# Patient Record
Sex: Male | Born: 1935 | Race: White | Hispanic: No | Marital: Married | Smoking: Former smoker
Health system: Southern US, Community
[De-identification: ages and names within clinical notes are randomized; demographics above are authoritative.]

## PROBLEM LIST (undated history)

## (undated) DIAGNOSIS — M1990 Unspecified osteoarthritis, unspecified site: Secondary | ICD-10-CM

## (undated) DIAGNOSIS — L719 Rosacea, unspecified: Secondary | ICD-10-CM

## (undated) DIAGNOSIS — I10 Essential (primary) hypertension: Secondary | ICD-10-CM

## (undated) DIAGNOSIS — M75120 Complete rotator cuff tear or rupture of unspecified shoulder, not specified as traumatic: Secondary | ICD-10-CM

## (undated) DIAGNOSIS — D126 Benign neoplasm of colon, unspecified: Secondary | ICD-10-CM

## (undated) DIAGNOSIS — K645 Perianal venous thrombosis: Secondary | ICD-10-CM

## (undated) HISTORY — DX: Benign neoplasm of colon, unspecified: D12.6

## (undated) HISTORY — PX: GLAUCOMA SURGERY: SHX656

## (undated) HISTORY — DX: Unspecified osteoarthritis, unspecified site: M19.90

## (undated) HISTORY — DX: Perianal venous thrombosis: K64.5

## (undated) HISTORY — DX: Essential (primary) hypertension: I10

## (undated) HISTORY — DX: Rosacea, unspecified: L71.9

## (undated) HISTORY — PX: JOINT REPLACEMENT: SHX530

## (undated) HISTORY — DX: Complete rotator cuff tear or rupture of unspecified shoulder, not specified as traumatic: M75.120

---

## 2003-06-24 ENCOUNTER — Encounter
Admission: RE | Admit: 2003-06-24 | Discharge: 2003-09-11 | Payer: Self-pay | Admitting: Physical Medicine and Rehabilitation

## 2005-02-11 ENCOUNTER — Ambulatory Visit: Payer: Self-pay | Admitting: Internal Medicine

## 2005-03-04 ENCOUNTER — Ambulatory Visit: Payer: Self-pay | Admitting: Internal Medicine

## 2006-02-14 ENCOUNTER — Ambulatory Visit: Payer: Self-pay | Admitting: Internal Medicine

## 2006-03-28 ENCOUNTER — Ambulatory Visit: Payer: Self-pay | Admitting: Internal Medicine

## 2006-05-10 ENCOUNTER — Ambulatory Visit: Payer: Self-pay | Admitting: Internal Medicine

## 2006-07-27 ENCOUNTER — Ambulatory Visit: Payer: Self-pay | Admitting: Internal Medicine

## 2006-12-08 ENCOUNTER — Ambulatory Visit: Payer: Self-pay | Admitting: Internal Medicine

## 2007-02-15 ENCOUNTER — Ambulatory Visit: Payer: Self-pay | Admitting: Internal Medicine

## 2007-02-15 LAB — CONVERTED CEMR LAB
ALT: 27 units/L (ref 0–40)
AST: 29 units/L (ref 0–37)
Albumin: 4.2 g/dL (ref 3.5–5.2)
Alkaline Phosphatase: 87 units/L (ref 39–117)
BUN: 7 mg/dL (ref 6–23)
Basophils Absolute: 0 10*3/uL (ref 0.0–0.1)
Basophils Relative: 0.1 % (ref 0.0–1.0)
Bilirubin, Direct: 0.1 mg/dL (ref 0.0–0.3)
CO2: 32 meq/L (ref 19–32)
Calcium: 9.4 mg/dL (ref 8.4–10.5)
Chloride: 107 meq/L (ref 96–112)
Cholesterol: 216 mg/dL (ref 0–200)
Creatinine, Ser: 1 mg/dL (ref 0.4–1.5)
Eosinophils Absolute: 0.3 10*3/uL (ref 0.0–0.6)
Eosinophils Relative: 4 % (ref 0.0–5.0)
GFR calc Af Amer: 95 mL/min
GFR calc non Af Amer: 79 mL/min
Glucose, Bld: 93 mg/dL (ref 70–99)
HCT: 42.2 % (ref 39.0–52.0)
HDL: 50.8 mg/dL (ref >39.0)
Hemoglobin: 14.5 g/dL (ref 13.0–17.0)
Lymphocytes Relative: 34 % (ref 12.0–46.0)
MCHC: 34.3 g/dL (ref 30.0–36.0)
MCV: 88.1 fL (ref 78.0–100.0)
Monocytes Absolute: 0.6 10*3/uL (ref 0.2–0.7)
Monocytes Relative: 8.9 % (ref 3.0–11.0)
Neutro Abs: 3.5 10*3/uL (ref 1.4–7.7)
Neutrophils Relative %: 53 % (ref 43.0–77.0)
PSA: 3.33 ng/mL (ref 0.10–4.00)
Platelets: 209 10*3/uL (ref 150–400)
Potassium: 4.4 meq/L (ref 3.5–5.1)
RBC: 4.79 M/uL (ref 4.22–5.81)
RDW: 12.8 % (ref 11.5–14.6)
Sodium: 146 meq/L — ABNORMAL HIGH (ref 135–145)
TSH: 2.94 microintl units/mL (ref 0.35–5.50)
Total Bilirubin: 1.3 mg/dL — ABNORMAL HIGH (ref 0.3–1.2)
Total CHOL/HDL Ratio: 4.3
Total Protein: 7.6 g/dL (ref 6.0–8.3)
Triglycerides: 79 mg/dL (ref 0–149)
VLDL: 16 mg/dL (ref 0–40)
WBC: 6.7 10*3/uL (ref 4.5–10.5)

## 2007-05-10 ENCOUNTER — Ambulatory Visit: Payer: Self-pay | Admitting: Internal Medicine

## 2007-07-17 DIAGNOSIS — M1990 Unspecified osteoarthritis, unspecified site: Secondary | ICD-10-CM | POA: Insufficient documentation

## 2007-07-17 DIAGNOSIS — I10 Essential (primary) hypertension: Secondary | ICD-10-CM | POA: Insufficient documentation

## 2007-08-17 ENCOUNTER — Ambulatory Visit: Payer: Self-pay | Admitting: Internal Medicine

## 2007-12-11 ENCOUNTER — Ambulatory Visit: Payer: Self-pay | Admitting: Internal Medicine

## 2007-12-11 DIAGNOSIS — F528 Other sexual dysfunction not due to a substance or known physiological condition: Secondary | ICD-10-CM

## 2008-01-29 ENCOUNTER — Telehealth (INDEPENDENT_AMBULATORY_CARE_PROVIDER_SITE_OTHER): Payer: Self-pay | Admitting: *Deleted

## 2008-01-29 DIAGNOSIS — M7500 Adhesive capsulitis of unspecified shoulder: Secondary | ICD-10-CM

## 2008-02-05 ENCOUNTER — Encounter: Admission: RE | Admit: 2008-02-05 | Discharge: 2008-02-05 | Payer: Self-pay | Admitting: Internal Medicine

## 2008-02-06 DIAGNOSIS — M75120 Complete rotator cuff tear or rupture of unspecified shoulder, not specified as traumatic: Secondary | ICD-10-CM | POA: Insufficient documentation

## 2008-02-06 HISTORY — DX: Complete rotator cuff tear or rupture of unspecified shoulder, not specified as traumatic: M75.120

## 2008-02-13 ENCOUNTER — Telehealth: Payer: Self-pay | Admitting: Internal Medicine

## 2008-02-20 ENCOUNTER — Encounter: Payer: Self-pay | Admitting: Internal Medicine

## 2008-02-23 ENCOUNTER — Ambulatory Visit: Payer: Self-pay | Admitting: Internal Medicine

## 2008-02-23 LAB — CONVERTED CEMR LAB
AST: 24 units/L (ref 0–37)
Albumin: 3.9 g/dL (ref 3.5–5.2)
Alkaline Phosphatase: 79 units/L (ref 39–117)
BUN: 7 mg/dL (ref 6–23)
Basophils Absolute: 0 10*3/uL (ref 0.0–0.1)
Basophils Relative: 0.6 % (ref 0.0–1.0)
Bilirubin Urine: NEGATIVE
Blood in Urine, dipstick: NEGATIVE
CO2: 29 meq/L (ref 19–32)
Chloride: 107 meq/L (ref 96–112)
Cholesterol: 194 mg/dL (ref 0–200)
Creatinine, Ser: 0.9 mg/dL (ref 0.4–1.5)
Glucose, Urine, Semiquant: NEGATIVE
HCT: 42.2 % (ref 39.0–52.0)
Ketones, urine, test strip: NEGATIVE
LDL Cholesterol: 124 mg/dL — ABNORMAL HIGH (ref 0–99)
MCHC: 33.3 g/dL (ref 30.0–36.0)
Neutrophils Relative %: 46.8 % (ref 43.0–77.0)
Nitrite: NEGATIVE
Protein, U semiquant: NEGATIVE
RBC: 4.66 M/uL (ref 4.22–5.81)
RDW: 12.5 % (ref 11.5–14.6)
Sodium: 142 meq/L (ref 135–145)
Specific Gravity, Urine: 1.015
Total Bilirubin: 1 mg/dL (ref 0.3–1.2)
Total CHOL/HDL Ratio: 5.1
Urobilinogen, UA: 0.2
VLDL: 33 mg/dL (ref 0–40)
WBC Urine, dipstick: NEGATIVE
pH: 5.5

## 2008-02-29 ENCOUNTER — Ambulatory Visit: Payer: Self-pay | Admitting: Internal Medicine

## 2008-02-29 DIAGNOSIS — C4491 Basal cell carcinoma of skin, unspecified: Secondary | ICD-10-CM

## 2008-07-31 ENCOUNTER — Ambulatory Visit: Payer: Self-pay | Admitting: Internal Medicine

## 2009-01-27 ENCOUNTER — Ambulatory Visit: Payer: Self-pay | Admitting: Internal Medicine

## 2009-01-27 LAB — CONVERTED CEMR LAB
ALT: 26 units/L (ref 0–53)
AST: 27 units/L (ref 0–37)
BUN: 9 mg/dL (ref 6–23)
Basophils Relative: 0.4 % (ref 0.0–3.0)
CO2: 29 meq/L (ref 19–32)
Chloride: 104 meq/L (ref 96–112)
Creatinine, Ser: 0.9 mg/dL (ref 0.4–1.5)
Direct LDL: 138.7 mg/dL
Eosinophils Relative: 6.3 % — ABNORMAL HIGH (ref 0.0–5.0)
HDL: 39.9 mg/dL (ref >39.0)
Lymphocytes Relative: 36.9 % (ref 12.0–46.0)
Monocytes Relative: 9.7 % (ref 3.0–12.0)
Neutrophils Relative %: 46.7 % (ref 43.0–77.0)
RBC: 4.75 M/uL (ref 4.22–5.81)
Total Bilirubin: 1.1 mg/dL (ref 0.3–1.2)
VLDL: 28 mg/dL (ref 0–40)
WBC: 5.7 10*3/uL (ref 4.5–10.5)

## 2009-02-03 ENCOUNTER — Ambulatory Visit: Payer: Self-pay | Admitting: Internal Medicine

## 2009-02-03 DIAGNOSIS — L570 Actinic keratosis: Secondary | ICD-10-CM

## 2009-02-03 DIAGNOSIS — E785 Hyperlipidemia, unspecified: Secondary | ICD-10-CM

## 2009-02-03 LAB — CONVERTED CEMR LAB: LDL Goal: 100 mg/dL

## 2010-02-10 DIAGNOSIS — D126 Benign neoplasm of colon, unspecified: Secondary | ICD-10-CM

## 2010-02-10 HISTORY — DX: Benign neoplasm of colon, unspecified: D12.6

## 2010-02-11 ENCOUNTER — Ambulatory Visit: Payer: Self-pay | Admitting: Internal Medicine

## 2010-02-11 LAB — CONVERTED CEMR LAB
ALT: 29 units/L (ref 0–53)
AST: 29 units/L (ref 0–37)
Albumin: 4.3 g/dL (ref 3.5–5.2)
CO2: 30 meq/L (ref 19–32)
Calcium: 9.1 mg/dL (ref 8.4–10.5)
Cholesterol: 201 mg/dL — ABNORMAL HIGH (ref 0–200)
Direct LDL: 127.3 mg/dL
GFR calc non Af Amer: 87.78 mL/min (ref >60)
PSA: 4.27 ng/mL — ABNORMAL HIGH (ref 0.10–4.00)
Sodium: 141 meq/L (ref 135–145)
TSH: 2.67 microintl units/mL (ref 0.35–5.50)
Total CHOL/HDL Ratio: 4
Total Protein: 8 g/dL (ref 6.0–8.3)
Triglycerides: 96 mg/dL (ref 0.0–149.0)

## 2010-02-13 ENCOUNTER — Encounter (INDEPENDENT_AMBULATORY_CARE_PROVIDER_SITE_OTHER): Payer: Self-pay | Admitting: *Deleted

## 2010-02-17 ENCOUNTER — Ambulatory Visit: Payer: Self-pay | Admitting: Gastroenterology

## 2010-02-23 ENCOUNTER — Ambulatory Visit: Payer: Self-pay

## 2010-02-23 ENCOUNTER — Encounter: Payer: Self-pay | Admitting: Internal Medicine

## 2010-02-24 ENCOUNTER — Ambulatory Visit: Payer: Self-pay | Admitting: Gastroenterology

## 2010-02-26 ENCOUNTER — Encounter: Payer: Self-pay | Admitting: Gastroenterology

## 2010-03-04 ENCOUNTER — Ambulatory Visit: Payer: Self-pay | Admitting: Internal Medicine

## 2010-04-15 ENCOUNTER — Ambulatory Visit: Payer: Self-pay | Admitting: Internal Medicine

## 2010-04-15 DIAGNOSIS — L719 Rosacea, unspecified: Secondary | ICD-10-CM

## 2010-04-29 ENCOUNTER — Telehealth: Payer: Self-pay | Admitting: Internal Medicine

## 2010-04-29 ENCOUNTER — Ambulatory Visit: Payer: Self-pay | Admitting: Internal Medicine

## 2010-08-12 ENCOUNTER — Ambulatory Visit: Payer: Self-pay | Admitting: Internal Medicine

## 2010-08-12 DIAGNOSIS — K645 Perianal venous thrombosis: Secondary | ICD-10-CM

## 2010-08-12 HISTORY — DX: Perianal venous thrombosis: K64.5

## 2010-09-01 ENCOUNTER — Ambulatory Visit: Payer: Self-pay | Admitting: Internal Medicine

## 2010-10-19 ENCOUNTER — Ambulatory Visit: Payer: Self-pay | Admitting: Internal Medicine

## 2010-10-19 LAB — CONVERTED CEMR LAB
Calcium: 9.3 mg/dL (ref 8.4–10.5)
Creatinine, Ser: 1 mg/dL (ref 0.4–1.5)
GFR calc non Af Amer: 80.36 mL/min (ref >60)

## 2010-10-26 ENCOUNTER — Ambulatory Visit: Payer: Self-pay | Admitting: Internal Medicine

## 2010-11-02 ENCOUNTER — Telehealth: Payer: Self-pay | Admitting: Internal Medicine

## 2010-11-23 ENCOUNTER — Ambulatory Visit: Payer: Self-pay | Admitting: Internal Medicine

## 2010-11-23 DIAGNOSIS — R29818 Other symptoms and signs involving the nervous system: Secondary | ICD-10-CM | POA: Insufficient documentation

## 2010-11-23 DIAGNOSIS — J069 Acute upper respiratory infection, unspecified: Secondary | ICD-10-CM | POA: Insufficient documentation

## 2010-11-23 DIAGNOSIS — R21 Rash and other nonspecific skin eruption: Secondary | ICD-10-CM

## 2010-12-21 ENCOUNTER — Telehealth: Payer: Self-pay | Admitting: Internal Medicine

## 2010-12-30 ENCOUNTER — Ambulatory Visit
Admission: RE | Admit: 2010-12-30 | Discharge: 2010-12-30 | Payer: Self-pay | Source: Home / Self Care | Attending: Internal Medicine | Admitting: Internal Medicine

## 2010-12-30 DIAGNOSIS — J321 Chronic frontal sinusitis: Secondary | ICD-10-CM | POA: Insufficient documentation

## 2011-01-12 NOTE — Miscellaneous (Signed)
Summary: Orders Update  Clinical Lists Changes  Orders: Added new Test order of Abdominal Aorta Duplex (Abd Aorta Duplex) - Signed 

## 2011-01-12 NOTE — Miscellaneous (Signed)
Summary: Waiver of Liability for Zostavax  Waiver of Liability for Zostavax   Imported By: Maryln Gottron 03/06/2010 12:41:31  _____________________________________________________________________  External Attachment:    Type:   Image     Comment:   External Document

## 2011-01-12 NOTE — Assessment & Plan Note (Signed)
Summary: zostavax at 4pm  Nurse Visit   Allergies: 1)  ! Celebrex  Immunizations Administered:  Zostavax # 1:    Vaccine Type: Zostavax    Site: left deltoid    Mfr: Merck    Dose: 0.5 ml    Route: [place]    Given by: Willy Eddy, LPN    Exp. Date: 12/31/2010    Lot #: [id number]  Orders Added: 1)  Zoster (Shingles) Vaccine Live [90736] 2)  Admin 1st Vaccine [09811]

## 2011-01-12 NOTE — Letter (Signed)
Summary: Patient Notice- Polyp Results  [facility]  [address] [place], [place] [address]   Phone: [phone]  Fax: [phone]        February 26, 2010 MRN: [id number]    Mitchell Walker [address] [place], [place]  [address]    Dear Mitchell Walker,  I am pleased to inform you that the colon polyp(s) removed during your recent colonoscopy was (were) found to be benign (no cancer detected) upon pathologic examination.  I recommend you have a repeat colonoscopy examination in 5 years to look for recurrent polyps, as having colon polyps increases your risk for having recurrent polyps or even colon cancer in the future.  Should you develop new or worsening symptoms of abdominal pain, bowel habit changes or bleeding from the rectum or bowels, please schedule an evaluation with either your primary care physician or with me.  Continue treatment plan as outlined the day of your exam.  Please call us if you are having persistent problems or have questions about your condition that have not been fully answered at this time.  Sincerely,  Meryl Dare MD [facility]  This letter has been electronically signed by your physician.  Appended Document: Patient Notice- Polyp Results Letter mailed 3.18.11

## 2011-01-12 NOTE — Progress Notes (Signed)
Summary: requesting muscle relaxer  Phone Note Call from Patient Call back at Home Phone [phone]   Caller: Patient--triage vm Summary of Call: requesting a muscle relaxer for his back, that he hurt on Saturday. wants [facility] to return his call.       [facility]. Initial call taken by: Warnell Forester,  November 02, 2010 9:05 AM    New/Updated Medications: CYCLOBENZAPRINE HCL 10 MG TABS (CYCLOBENZAPRINE HCL) 1 three times a day as needed Prescriptions: CYCLOBENZAPRINE HCL 10 MG TABS (CYCLOBENZAPRINE HCL) 1 three times a day as needed  #30 x 0   Entered by:   Willy Eddy, LPN   Authorized by:   [name] MD   Signed by:   Willy Eddy, LPN on 28/41/3244   Method used:   Electronically to        [facility]  [phone]* (retail)       [address]       [place], [place]  [address]       Ph: [phone] or [phone]       Fax: [phone]   RxID:   [id number]

## 2011-01-12 NOTE — Assessment & Plan Note (Signed)
Summary: FLU-SHOT/RCD  Nurse Visit   Review of Systems       Flu Vaccine Consent Questions     Do you have a history of severe allergic reactions to this vaccine? no    Any prior history of allergic reactions to egg and/or gelatin? no    Do you have a sensitivity to the preservative Thimersol? no    Do you have a past history of Guillan-Barre Syndrome? no    Do you currently have an acute febrile illness? no    Have you ever had a severe reaction to latex? no    Vaccine information given and explained to patient? yes    Are you currently pregnant? no    Lot Number:[id number]   Exp Date:06/12/2011   Site Given  Left Deltoid IM    Allergies: 1)  ! Celebrex  Orders Added: 1)  Flu Vaccine 78yrs + MEDICARE PATIENTS [Q2039] 2)  Administration Flu vaccine - MCR [G0008]

## 2011-01-12 NOTE — Miscellaneous (Signed)
Summary: [facility] Previsit/prep  starClinical Lists Changes  Medications: Added new medication of MOVIPREP 100 GM  SOLR (PEG-KCL-NACL-NASULF-NA ASC-C) As per prep instructions. - Signed Rx of MOVIPREP 100 GM  SOLR (PEG-KCL-NACL-NASULF-NA ASC-C) As per prep instructions.;  #1 x 0;  Signed;  Entered by: Wyona Almas RN;  Authorized by: Meryl Dare MD [facility];  Method used: Electronically to [facility]  [phone]*, [address], [place], [place], [place]  [address], Ph: [phone] or [phone], Fax: [phone] Observations: Added new observation of ALLERGY REV: Done (02/17/2010 10:54)    Prescriptions: MOVIPREP 100 GM  SOLR (PEG-KCL-NACL-NASULF-NA ASC-C) As per prep instructions.  #1 x 0   Entered by:   Wyona Almas RN   Authorized by:   Meryl Dare MD [facility]   Signed by:   Wyona Almas RN on 02/17/2010   Method used:   Electronically to        [facility]  [phone]* (retail)       [address]       [place], [place]  [address]       Ph: [phone] or [phone]       Fax: [phone]   RxID:   [id number]

## 2011-01-12 NOTE — Letter (Signed)
Summary: [facility] Instructions  [facility]  [address] [place], [place] [address]   Phone: [phone]  Fax: [phone]       Mitchell Walker    05-Apr-1934    MRN: [id number]        Procedure Day Dorna Bloom:  Jake Shark  02/24/10     Arrival Time:  9:00AM     Procedure Time:  10:00AM     Location of Procedure:                    _ X_  [facility] (4th Floor)                        PREPARATION FOR COLONOSCOPY WITH MOVIPREP   Starting 5 days prior to your procedure 02/19/10 do not eat nuts, seeds, popcorn, corn, beans, peas,  salads, or any raw vegetables.  Do not take any fiber supplements (e.g. Metamucil, Citrucel, and Benefiber).  THE DAY BEFORE YOUR PROCEDURE         DATE: 02/23/10  DAY: MONDAY  1.  Drink clear liquids the entire day-NO SOLID FOOD  2.  Do not drink anything colored red or purple.  Avoid juices with pulp.  No orange juice.  3.  Drink at least 64 oz. (8 glasses) of fluid/clear liquids during the day to prevent dehydration and help the prep work efficiently.  CLEAR LIQUIDS INCLUDE: Water Jello Ice Popsicles Tea (sugar ok, no milk/cream) Powdered fruit flavored drinks Coffee (sugar ok, no milk/cream) Gatorade Juice: apple, white grape, white cranberry  Lemonade Clear bullion, consomm, broth Carbonated beverages (any kind) Strained chicken noodle soup Hard Candy                             4.  In the morning, mix first dose of MoviPrep solution:    Empty 1 Pouch A and 1 Pouch B into the disposable container    Add lukewarm drinking water to the top line of the container. Mix to dissolve    Refrigerate (mixed solution should be used within 24 hrs)  5.  Begin drinking the prep at 5:00 p.m. The MoviPrep container is divided by 4 marks.   Every 15 minutes drink the solution down to the next mark (approximately 8 oz) until the full liter is complete.   6.  Follow completed prep with 16 oz of clear liquid of your choice (Nothing  red or purple).  Continue to drink clear liquids until bedtime.  7.  Before going to bed, mix second dose of MoviPrep solution:    Empty 1 Pouch A and 1 Pouch B into the disposable container    Add lukewarm drinking water to the top line of the container. Mix to dissolve    Refrigerate  THE DAY OF YOUR PROCEDURE      DATE: 02/24/10 DAY: TUESDAY  Beginning at 5:00a.m. (5 hours before procedure):         1. Every 15 minutes, drink the solution down to the next mark (approx 8 oz) until the full liter is complete.  2. Follow completed prep with 16 oz. of clear liquid of your choice.    3. You may drink clear liquids until 8:00AM (2 HOURS BEFORE PROCEDURE).   MEDICATION INSTRUCTIONS  Unless otherwise instructed, you should take regular prescription medications with a small sip of water   as early as possible the morning of  your procedure.  Additional medication instructions:  Hold Benazipril/HCTZ the morning of procedure.         OTHER INSTRUCTIONS  You will need a responsible adult at least 75 years of age to accompany you and drive you home.   This person must remain in the waiting room during your procedure.  Wear loose fitting clothing that is easily removed.  Leave jewelry and other valuables at home.  However, you may wish to bring a book to read or  an iPod/MP3 player to listen to music as you wait for your procedure to start.  Remove all body piercing jewelry and leave at home.  Total time from sign-in until discharge is approximately 2-3 hours.  You should go home directly after your procedure and rest.  You can resume normal activities the  day after your procedure.  The day of your procedure you should not:   Drive   Make legal decisions   Operate machinery   Drink alcohol   Return to work  You will receive specific instructions about eating, activities and medications before you leave.    The above instructions have been reviewed and explained to  me by  Wyona Almas RN  February 17, 2010 11:12 AM     I fully understand and can verbalize these instructions _____________________________ Date _________

## 2011-01-12 NOTE — Assessment & Plan Note (Signed)
Summary: CPX, LABS / RS   Vital Signs:  Patient profile:   77 year old male Height:      70 inches Weight:      191 pounds BMI:     27.50 Temp:     98.2 degrees F oral Pulse rate:   60 / minute Resp:     14 per minute BP sitting:   170 / 100  (left arm)  Vitals Entered By: Willy Eddy, LPN (February 12, 4695 10:58 AM) CC: anuual visit for disease managmemment-fasting this am had flex about 6 years ago but no colonoscopy Is Patient Diabetic? No Pain Assessment Patient in pain? no      Nutritional Status BMI of 19 -24 = normal  Does patient need assistance? Functional Status Self care, Cook/clean, Shopping, Social activities Ambulation Normal Comments no depression noted   CC:  anuual visit for disease managmemment-fasting this am had flex about 6 years ago but no colonoscopy.  History of Present Illness: First medicare preventative visit   the pt was asked about functional status, depression, living will, all immunizations were reviewed prevention intervnetions were discussed and scheduled as appropriate amd referral and specialist care was reviewed which included yearly eye exam only  screening labs for cholesterol, PSA were ordered and discussion of AAA screening and referral for AA [place] dopler made  reveiwed medications fro generic substitutions   Preventive Screening-Counseling & Management  Alcohol-Tobacco     Smoking Status: quit  Problems Prior to Update: 1)  Actinic Keratosis  (ICD-702.0) 2)  Hyperlipidemia, Atherogenic  (ICD-272.4) 3)  Carcinoma, Basal Cell, Skin  (ICD-173.9) 4)  Rotator Cuff Tear  (ICD-727.61) 5)  Adhesive Capsulitis of Shoulder  (ICD-726.0) 6)  Erectile Dysfunction  (ICD-302.72) 7)  Unspec Disorders Bursae&tendons Shoulder Region  (ICD-726.10) 8)  Family History Diabetes 1st Degree Relative  (ICD-V18.0) 9)  Osteoarthritis  (ICD-715.90) 10)  Hypertension  (ICD-401.9)  Current Problems (verified): 1)  Actinic Keratosis   (ICD-702.0) 2)  Hyperlipidemia, Atherogenic  (ICD-272.4) 3)  Carcinoma, Basal Cell, Skin  (ICD-173.9) 4)  Rotator Cuff Tear  (ICD-727.61) 5)  Adhesive Capsulitis of Shoulder  (ICD-726.0) 6)  Erectile Dysfunction  (ICD-302.72) 7)  Unspec Disorders Bursae&tendons Shoulder Region  (ICD-726.10) 8)  Family History Diabetes 1st Degree Relative  (ICD-V18.0) 9)  Osteoarthritis  (ICD-715.90) 10)  Hypertension  (ICD-401.9)  Medications Prior to Update: 1)  Lotensin 40 Mg  Tabs (Benazepril Hcl) .... One Daily 2)  Norvasc 10 Mg  Tabs (Amlodipine Besylate) .... One Daily 3)  Amoxicillin 500 Mg Caps (Amoxicillin) .Marland Kitchen.. 1 Three Times A Day 4)  Pravastatin Sodium 40 Mg Tabs (Pravastatin Sodium) .... One By Mouth  Mondays and Friday Nights  Current Medications (verified): 1)  Lotensin 40 Mg  Tabs (Benazepril Hcl) .... One Daily 2)  Norvasc 10 Mg  Tabs (Amlodipine Besylate) .... One Daily 3)  Fish Oil 500 Mg Caps (Omega-3 Fatty Acids) .... 2 Once Daily  Allergies (verified): 1)  ! Celebrex  Past History:  Family History: Last updated: 07/17/2007 Family History of Arthritis Family History Diabetes 1st degree relative Family History Other cancer-Colon Fam hx DJD Fam hx CHF Fam hx CAD  Social History: Last updated: 07/17/2007 Former Smoker Alcohol use-yes Married  Risk Factors: Smoking Status: quit (02/11/2010)  Past medical, surgical, family and social histories (including risk factors) reviewed, and no changes noted (except as noted below).  Past Medical History: Reviewed history from 07/17/2007 and no changes required. Hypertension Osteoarthritis  Past Surgical  History: Reviewed history from 12/11/2007 and no changes required. Total knee replacement  bilateral glacoma surgery  Family History: Reviewed history from 07/17/2007 and no changes required. Family History of Arthritis Family History Diabetes 1st degree relative Family History Other cancer-Colon Fam hx DJD Fam hx  CHF Fam hx CAD  Social History: Reviewed history from 07/17/2007 and no changes required. Former Smoker Alcohol use-yes Married  Review of Systems  The patient denies anorexia, fever, weight loss, weight gain, vision loss, decreased hearing, hoarseness, chest pain, syncope, dyspnea on exertion, peripheral edema, prolonged cough, headaches, hemoptysis, abdominal pain, melena, hematochezia, severe indigestion/heartburn, hematuria, incontinence, genital sores, muscle weakness, suspicious skin lesions, transient blindness, difficulty walking, depression, unusual weight change, abnormal bleeding, enlarged lymph nodes, angioedema, breast masses, and testicular masses.    Physical Exam  General:  Well-developed,well-nourished,in no acute distress; alert,appropriate and cooperative throughout examination Head:  Normocephalic and atraumatic without obvious abnormalities. No apparent alopecia or balding. Ears:  R ear normal and L ear normal.   Mouth:  Oral mucosa and oropharynx without lesions or exudates.  Teeth in good repair. Neck:  No deformities, masses, or tenderness noted. Lungs:  normal respiratory effort and no dullness.   Heart:  normal rate and regular rhythm.   Abdomen:  Bowel sounds positive,abdomen soft and non-tender without masses, organomegaly or hernias noted.   Impression & Recommendations:  Problem # 1:  PREVENTIVE HEALTH CARE (ICD-V70.0)  completed medicare screening and orders Td Booster: Tdap (State) (12/14/2003)   Pneumovax: Historical (12/13/2004) Chol: 214 (01/27/2009)   HDL: 39.9 (01/27/2009)   LDL: DEL (01/27/2009)   TG: 140 (01/27/2009) TSH: 3.16 (01/27/2009)   PSA: 2.95 (01/27/2009)  Discussed using sunscreen, use of alcohol, drug use, self testicular exam, routine dental care, routine eye care, routine physical exam, seat belts, multiple vitamins, osteoporosis prevention, adequate calcium intake in diet, and recommendations for immunizations.  Discussed exercise  and checking cholesterol.  Discussed gun safety, safe sex, and contraception. Also recommend checking PSA.  Orders: First annual wellness visit with prevention plan  [phone]) Doppler Referral (Doppler)  Complete Medication List: 1)  Lotensin 40 Mg Tabs (Benazepril hcl) .... One daily 2)  Norvasc 10 Mg Tabs (Amlodipine besylate) .... One daily 3)  Fish Oil 500 Mg Caps (Omega-3 fatty acids) .... 2 once daily  Other Orders: EKG w/ Interpretation (93000) Gastroenterology Referral (GI) TLB-Lipid Panel (80061-LIPID) TLB-Hepatic/Liver Function Pnl (80076-HEPATIC) TLB-BMP (Basic Metabolic Panel-BMET) (80048-METABOL)  Prevention & Chronic Care Immunizations   Influenza vaccine: Not documented   Influenza vaccine deferral: Not available  (02/11/2010)   Influenza vaccine due: 08/13/2010    Tetanus booster: 12/14/2003: Tdap [facility])   Tetanus booster due: 12/13/2013    Pneumococcal vaccine: Historical  (12/13/2004)   Pneumococcal vaccine due: 12/13/2009    H. zoster vaccine: Not documented   H. zoster vaccine deferral: Not available  (02/11/2010)  Colorectal Screening   Hemoccult: Not documented    Colonoscopy: Not documented   Colonoscopy action/deferral: GI referral  (02/11/2010)  Other Screening   PSA: 2.95  (01/27/2009)   PSA action/deferral: Discussed-PSA requested  (02/11/2010)   Smoking status: quit  (02/11/2010)    Screening comments: SET UP aaa SCREEN  Lipids   Total Cholesterol: 214  (01/27/2009)   Lipid panel action/deferral: Lipid Panel ordered   LDL: DEL  (01/27/2009)   LDL Direct: 138.7  (01/27/2009)   HDL: 39.9  (01/27/2009)   Triglycerides: 140  (01/27/2009)    SGOT (AST): 27  (01/27/2009)   BMP action: Ordered  SGPT (ALT): 26  (01/27/2009)   Alkaline phosphatase: 73  (01/27/2009)   Total bilirubin: 1.1  (01/27/2009)    Lipid flowsheet reviewed?: Yes   Progress toward LDL goal: Unchanged  Hypertension   Last Blood Pressure: 170 / 100   (02/11/2010)   Serum creatinine: 0.9  (01/27/2009)   BMP action: Ordered   Serum potassium 4.5  (01/27/2009)    Stage of readiness to change (hypertension management): Action   Hypertension comments: CHANGE MEDICATIONS  Self-Management Support :    Patient will work on the following items until the next clinic visit to reach self-care goals:     Medications and monitoring: take my medicines every day  (02/11/2010)     Eating: eat more vegetables, use fresh or frozen vegetables, eat foods that are low in salt  (02/11/2010)     Activity: take a 30 minute walk every day  (02/11/2010)    Hypertension self-management support: BP self-monitoring log, Written self-care plan  (02/11/2010)   Hypertension self-care plan printed.    Hypertension self-management support not done because: Good outcomes  (02/11/2010)    Lipid self-management support: Lipid monitoring log, Written self-care plan  (02/11/2010)   Lipid self-care plan printed.   Nursing Instructions: Give Pneumovax today GI referral for screening colonoscopy (see order)    Appended Document: CPX, LABS / RS     Vital Signs:  Patient profile:   77 year old male Weight:      191 pounds BMI:     27.50 Pulse rate:   60 / minute Pulse rhythm:   regular BP sitting:   170 / 100  Nutrition Counseling: Patient's BMI is greater than 25 and therefore counseled on weight management options.  History of Present Illness:  Hyperlipidemia Follow-Up      This is a 77 year old man who presents for Hyperlipidemia follow-up.  The patient denies muscle aches, GI upset, abdominal pain, flushing, itching, constipation, diarrhea, and fatigue.  The patient denies the following symptoms: chest pain/pressure, exercise intolerance, dypsnea, palpitations, syncope, and pedal edema.  Compliance with medications (by patient report) has been near 100%.  Dietary compliance has been good.  The patient reports exercising 3-4X per week.  Adjunctive  measures currently used by the patient include weight reduction.    Hypertension Follow-Up      The patient also presents for Hypertension follow-up.  The patient denies lightheadedness, urinary frequency, headaches, edema, impotence, rash, and fatigue.  The patient denies the following associated symptoms: chest pain, chest pressure, exercise intolerance, dyspnea, palpitations, syncope, leg edema, and pedal edema.  Compliance with medications (by patient report) has been near 100%.  The patient reports that dietary compliance has been good.  The patient reports exercising occasionally.   weight increased med compliance/ adherance  Problems Prior to Update: 1)  Preventive Health Care  (ICD-V70.0) 2)  Special Screening For Malignant Neoplasms Colon  (ICD-V76.51) 3)  Actinic Keratosis  (ICD-702.0) 4)  Hyperlipidemia, Atherogenic  (ICD-272.4) 5)  Carcinoma, Basal Cell, Skin  (ICD-173.9) 6)  Rotator Cuff Tear  (ICD-727.61) 7)  Adhesive Capsulitis of Shoulder  (ICD-726.0) 8)  Erectile Dysfunction  (ICD-302.72) 9)  Unspec Disorders Bursae&tendons Shoulder Region  (ICD-726.10) 10)  Family History Diabetes 1st Degree Relative  (ICD-V18.0) 11)  Osteoarthritis  (ICD-715.90) 12)  Hypertension  (ICD-401.9)  Medications Prior to Update: 1)  Lotensin 40 Mg  Tabs (Benazepril Hcl) .... One Daily 2)  Norvasc 10 Mg  Tabs (Amlodipine Besylate) .... One Daily 3)  Fish  Oil 500 Mg Caps (Omega-3 Fatty Acids) .... 2 Once Daily  Current Medications (verified): 1)  Lotensin 40 Mg  Tabs (Benazepril Hcl) .... One Daily 2)  Norvasc 10 Mg  Tabs (Amlodipine Besylate) .... One Daily 3)  Fish Oil 500 Mg Caps (Omega-3 Fatty Acids) .... 2 Once Daily  Allergies (verified): 1)  ! Celebrex  Past History:  Family History: Last updated: 07/17/2007 Family History of Arthritis Family History Diabetes 1st degree relative Family History Other cancer-Colon Fam hx DJD Fam hx CHF Fam hx CAD  Social History: Last  updated: 07/17/2007 Former Smoker Alcohol use-yes Married  Risk Factors: Smoking Status: quit (02/11/2010)  Past medical, surgical, family and social histories (including risk factors) reviewed, and no changes noted (except as noted below).  Past Medical History: Reviewed history from 07/17/2007 and no changes required. Hypertension Osteoarthritis  Past Surgical History: Reviewed history from 12/11/2007 and no changes required. Total knee replacement  bilateral glacoma surgery  Family History: Reviewed history from 07/17/2007 and no changes required. Family History of Arthritis Family History Diabetes 1st degree relative Family History Other cancer-Colon Fam hx DJD Fam hx CHF Fam hx CAD  Social History: Reviewed history from 07/17/2007 and no changes required. Former Smoker Alcohol use-yes Married  Review of Systems  The patient denies anorexia, fever, weight loss, weight gain, vision loss, decreased hearing, hoarseness, chest pain, syncope, dyspnea on exertion, peripheral edema, prolonged cough, headaches, hemoptysis, abdominal pain, melena, hematochezia, severe indigestion/heartburn, hematuria, incontinence, genital sores, muscle weakness, suspicious skin lesions, transient blindness, difficulty walking, depression, unusual weight change, abnormal bleeding, enlarged lymph nodes, angioedema, breast masses, and testicular masses.    Physical Exam  General:  alert and well-hydrated.   Head:  normocephalic, atraumatic, and no abnormalities palpated.   Eyes:  pupils equal and pupils round.   Ears:  R ear normal and L ear normal.   Nose:  no external deformity and no nasal discharge.   Neck:  No deformities, masses, or tenderness noted. Lungs:  normal respiratory effort and no wheezes.   Heart:  normal rate and regular rhythm.   Abdomen:  soft, non-tender, and normal bowel sounds.   Genitalia:  circumcised and no urethral discharge.   Prostate:  boggy and 1+ enlarged.    Msk:  No deformity or scoliosis noted of thoracic or lumbar spine.  normal ROM.   Pulses:  R and L carotid,radial,femoral,dorsalis pedis and posterior tibial pulses are full and equal bilaterally Extremities:  No clubbing, cyanosis, edema, or deformity noted with normal full range of motion of all joints.   Neurologic:  alert & oriented X3 and gait normal.   Skin:  turgor normal and color normal.   Cervical Nodes:  No lymphadenopathy noted Axillary Nodes:  No palpable lymphadenopathy Psych:  Oriented X3.     Impression & Recommendations:  Problem # 1:  HYPERLIPIDEMIA, ATHEROGENIC (ICD-272.4)  Labs Reviewed: SGOT: 27 (01/27/2009)   SGPT: 26 (01/27/2009) increased the fish oil and urged diet and weight loss  Lipid Goals: Chol Goal: 200 (02/03/2009)   HDL Goal: 40 (02/03/2009)   LDL Goal: 100 (02/03/2009)   TG Goal: 150 (02/03/2009)  Prior 10 Yr Risk Heart Disease: 33 % (02/03/2009)   HDL:39.9 (01/27/2009), 37.7 (02/23/2008)  LDL:DEL (01/27/2009), 124 (02/23/2008)  Chol:214 (01/27/2009), 194 (02/23/2008)  Trig:140 (01/27/2009), 164 (02/23/2008)  Orders: TLB-TSH (Thyroid Stimulating Hormone) (84443-TSH)  Problem # 2:  ERECTILE DYSFUNCTION (ICD-302.72) refill the viagra  Problem # 3:  HYPERTENSION (ICD-401.9)  His updated medication list for  this problem includes:    Benazepril-hydrochlorothiazide 20-25 Mg Tabs (Benazepril-hydrochlorothiazide) ..... One by mouth daily    Amlodipine Besylate 10 Mg Tabs (Amlodipine besylate) ..... One by mouth daily  BP today: 170/100 Prior BP: 170/100 (02/11/2010)  Prior 10 Yr Risk Heart Disease: 33 % (02/03/2009)  Labs Reviewed: K+: 4.5 (01/27/2009) Creat: : 0.9 (01/27/2009)   Chol: 214 (01/27/2009)   HDL: 39.9 (01/27/2009)   LDL: DEL (01/27/2009)   TG: 140 (01/27/2009)  Complete Medication List: 1)  Benazepril-hydrochlorothiazide 20-25 Mg Tabs (Benazepril-hydrochlorothiazide) .... One by mouth daily 2)  Amlodipine Besylate 10 Mg Tabs  (Amlodipine besylate) .... One by mouth daily 3)  Fish Oil Concentrate 1000 Mg Caps (Omega-3 fatty acids) .... One two times a day  Other Orders: Venipuncture (04540) TLB-PSA (Prostate Specific Antigen) (84153-PSA)  Patient Instructions: 1)  Please schedule a follow-up appointment in 2 months. Prescriptions: BENAZEPRIL-HYDROCHLOROTHIAZIDE 20-25 MG TABS (BENAZEPRIL-HYDROCHLOROTHIAZIDE) one by mouth daily  #90 x 3   Entered and Authorized by:   [name] MD   Signed by:   [name] MD on 02/11/2010   Method used:   Electronically to        [facility]  [phone]* (retail)       [address]       [place], [place]  [address]       Ph: [phone] or [phone]       Fax: [phone]   RxID:   [id number] AMLODIPINE BESYLATE 10 MG TABS (AMLODIPINE BESYLATE) one by mouth daily  #90 x 3   Entered and Authorized by:   [name] MD   Signed by:   [name] MD on 02/11/2010   Method used:   Electronically to        [facility]* (mail-order)             ,          Ph: [phone]       Fax: [phone]   RxID:   [id number]   Appended Document: Orders Update    Clinical Lists Changes  Orders: Added new Service order of Pneumococcal Vaccine (57322) - Signed Added new Service order of Admin 1st Vaccine (02542) - Signed Observations: Added new observation of PNEUMOVAXLOT: 0956z (02/11/2010 12:22) Added new observation of PNEUMOVAXEXP: 11/29/2011 (02/11/2010 12:22) Added new observation of PNEUMOVAXBY: Willy Eddy, LPN (70/62/3762 12:22) Added new observation of PNEUMOVAXRTE: IM (02/11/2010 12:22) Added new observation of PNEUMOVAXDOS: 0.5 ml (02/11/2010 12:22) Added new observation of PNEUMOVAXMFR: Merck (02/11/2010 12:22) Added new observation of PNEUMOVAXSIT: left deltoid (02/11/2010 12:22) Added new observation of PNEUMOVAX: Pneumovax (Medicare) (02/11/2010 12:22)       Immunizations  Administered:  Pneumonia Vaccine:    Vaccine Type: Pneumovax (Medicare)    Site: left deltoid    Mfr: Merck    Dose: 0.5 ml    Route: IM    Given by: Willy Eddy, LPN    Exp. Date: 11/29/2011    Lot #: [id number]

## 2011-01-12 NOTE — Assessment & Plan Note (Signed)
Summary: 6 month rov/nrj   Vital Signs:  Patient profile:   77 year old male Weight:      187 pounds Temp:     98.6 degrees F oral Pulse rate:   76 / minute Pulse rhythm:   regular Resp:     16 per minute BP sitting:   144 / 82  Vitals Entered By: Lynann Beaver CMA AAMA (October 26, 2010 9:16 AM) CC: rov, Hypertension Management Is Patient Diabetic? No Pain Assessment Patient in pain? no        Primary Care Provider:  [name] MD  CC:  rov and Hypertension Management.  History of Present Illness: hemorrhiods have been quiet! blood pressure is well controlled with no side efects and potassium and renal functon stable his main complaint is the rosacia and the metrogel controls the face but the nose has become increasingly inflamed his blood pressure is well controlled and he tolerates the medicsations without reported side effects  Hypertension History:      He denies headache, chest pain, palpitations, dyspnea with exertion, orthopnea, PND, peripheral edema, visual symptoms, neurologic problems, syncope, and side effects from treatment.        Positive major cardiovascular risk factors include male age 55 years old or older, hyperlipidemia, and hypertension.  Negative major cardiovascular risk factors include non-tobacco-user status.        Further assessment for target organ damage reveals no history of ASHD, stroke/TIA, or peripheral vascular disease.     Preventive Screening-Counseling & Management  Alcohol-Tobacco     Smoking Status: quit     Tobacco Counseling: to remain off tobacco products  Problems Prior to Update: 1)  External Hemorrhoids, Thrombosed  (ICD-455.4) 2)  Acne Rosacea  (ICD-695.3) 3)  Preventive Health Care  (ICD-V70.0) 4)  Actinic Keratosis  (ICD-702.0) 5)  Hyperlipidemia, Atherogenic  (ICD-272.4) 6)  Carcinoma, Basal Cell, Skin  (ICD-173.9) 7)  Rotator Cuff Tear  (ICD-727.61) 8)  Adhesive Capsulitis of Shoulder  (ICD-726.0) 9)   Erectile Dysfunction  (ICD-302.72) 10)  Unspec Disorders Bursae&tendons Shoulder Region  (ICD-726.10) 11)  Family History Diabetes 1st Degree Relative  (ICD-V18.0) 12)  Osteoarthritis  (ICD-715.90) 13)  Hypertension  (ICD-401.9)  Medications Prior to Update: 1)  Benazepril-Hydrochlorothiazide 20-25 Mg Tabs (Benazepril-Hydrochlorothiazide) .... One By Mouth Daily 2)  Amlodipine Besylate 10 Mg Tabs (Amlodipine Besylate) .... One By Mouth Daily 3)  Fish Oil Concentrate 1000 Mg Caps (Omega-3 Fatty Acids) .... One Two Times A Day 4)  Metrogel 1 % Gel (Metronidazole) .... Apply To Face Two Times A Day As  Directed 5)  Vimovo 500-20 Mg Tbec (Naproxen-Esomeprazole) .... One By Mouth Two Times A Day 6)  Percocet 5-325 Mg Tabs (Oxycodone-Acetaminophen) .... One By Mouth Every 6 Hours  Current Medications (verified): 1)  Benazepril-Hydrochlorothiazide 20-25 Mg Tabs (Benazepril-Hydrochlorothiazide) .... One By Mouth Daily 2)  Amlodipine Besylate 10 Mg Tabs (Amlodipine Besylate) .... One By Mouth Daily 3)  Fish Oil Concentrate 1000 Mg Caps (Omega-3 Fatty Acids) .... One Two Times A Day 4)  Metrogel 1 % Gel (Metronidazole) .... Apply To Face Two Times A Day As  Directed 5)  Sulfamethoxazole-Tmp Ds 800-160 Mg Tabs (Sulfamethoxazole-Trimethoprim) .... One By Mouth Two Times A Day  Allergies (verified): 1)  ! Celebrex  Past History:  Family History: Last updated: 07/17/2007 Family History of Arthritis Family History Diabetes 1st degree relative Family History Other cancer-Colon Fam hx DJD Fam hx CHF Fam hx CAD  Social History: Last updated: 07/17/2007 Former  Smoker Alcohol use-yes Married  Risk Factors: Smoking Status: quit (10/26/2010)  Past medical, surgical, family and social histories (including risk factors) reviewed, and no changes noted (except as noted below).  Past Medical History: Reviewed history from 04/29/2010 and no changes  required. Hypertension Osteoarthritis rosasea  Past Surgical History: Reviewed history from 12/11/2007 and no changes required. Total knee replacement  bilateral glacoma surgery  Family History: Reviewed history from 07/17/2007 and no changes required. Family History of Arthritis Family History Diabetes 1st degree relative Family History Other cancer-Colon Fam hx DJD Fam hx CHF Fam hx CAD  Social History: Reviewed history from 07/17/2007 and no changes required. Former Smoker Alcohol use-yes Married  Review of Systems  The patient denies anorexia, fever, weight loss, weight gain, vision loss, decreased hearing, hoarseness, chest pain, syncope, dyspnea on exertion, peripheral edema, prolonged cough, headaches, hemoptysis, abdominal pain, melena, hematochezia, severe indigestion/heartburn, hematuria, incontinence, genital sores, muscle weakness, suspicious skin lesions, transient blindness, difficulty walking, depression, unusual weight change, abnormal bleeding, enlarged lymph nodes, angioedema, breast masses, and testicular masses.    Physical Exam  General:  alert and well-hydrated.   Head:  normocephalic, atraumatic, and no abnormalities palpated.   Eyes:  pupils equal and pupils round.   Ears:  R ear normal and L ear normal.   Nose:  external deformity and external erythema.   Mouth:  Oral mucosa and oropharynx without lesions or exudates.  Teeth in good repair. Neck:  No deformities, masses, or tenderness noted. Lungs:  normal respiratory effort and no wheezes.   Heart:  normal rate and regular rhythm.   Abdomen:  soft, non-tender, and normal bowel sounds.   Msk:  No deformity or scoliosis noted of thoracic or lumbar spine.  normal ROM.   Pulses:  R and L carotid,radial,femoral,dorsalis pedis and posterior tibial pulses are full and equal bilaterally Extremities:  No clubbing, cyanosis, edema, or deformity noted with normal full range of motion of all joints.    Neurologic:  alert & oriented X3 and gait normal.     Impression & Recommendations:  Problem # 1:  ACNE ROSACEA (ICD-695.3) with deforming rash on nose add oral sulfa ( he is not on celebrex) moniteri in two months  Problem # 2:  ERECTILE DYSFUNCTION (ICD-302.72) viagra smaples Discussed proper use of medications, as well as side effects.   Problem # 3:  EXTERNAL HEMORRHOIDS, THROMBOSED (ICD-455.4) resolved post treatment  Problem # 4:  HYPERTENSION (ICD-401.9) Assessment: Improved  His updated medication list for this problem includes:    Benazepril-hydrochlorothiazide 20-25 Mg Tabs (Benazepril-hydrochlorothiazide) ..... One by mouth daily    Amlodipine Besylate 10 Mg Tabs (Amlodipine besylate) ..... One by mouth daily  BP today: 160/82 Prior BP: 136/80 (08/12/2010)  Prior 10 Yr Risk Heart Disease: Not enough information (04/15/2010)  Labs Reviewed: K+: 4.8 (10/19/2010) Creat: : 1.0 (10/19/2010)   Chol: 201 (02/11/2010)   HDL: 57.10 (02/11/2010)   LDL: DEL (01/27/2009)   TG: 96.0 (02/11/2010)  Complete Medication List: 1)  Benazepril-hydrochlorothiazide 20-25 Mg Tabs (Benazepril-hydrochlorothiazide) .... One by mouth daily 2)  Amlodipine Besylate 10 Mg Tabs (Amlodipine besylate) .... One by mouth daily 3)  Fish Oil Concentrate 1000 Mg Caps (Omega-3 fatty acids) .... One two times a day 4)  Metrogel 1 % Gel (Metronidazole) .... Apply to face two times a day as  directed 5)  Sulfamethoxazole-tmp Ds 800-160 Mg Tabs (Sulfamethoxazole-trimethoprim) .... One by mouth two times a day  Hypertension Assessment/Plan:      The patient's  hypertensive risk group is category B: At least one risk factor (excluding diabetes) with no target organ damage.  Today's blood pressure is 144/82.  His blood pressure goal is < 140/90.  Patient Instructions: 1)  Please schedule a follow-up appointment in 2 months. Prescriptions: SULFAMETHOXAZOLE-TMP DS 800-160 MG TABS  (SULFAMETHOXAZOLE-TRIMETHOPRIM) one by mouth two times a day  #60 x 1   Entered and Authorized by:   [name] MD   Signed by:   [name] MD on 10/26/2010   Method used:   Electronically to        [facility]  [phone]* (retail)       [address]       [place], [place]  [address]       Ph: [phone] or [phone]       Fax: [phone]   RxID:   [id number]    Orders Added: 1)  Est. Patient Level IV [59563]

## 2011-01-12 NOTE — Assessment & Plan Note (Signed)
Summary: 2 month fup//ccm   Vital Signs:  Patient profile:   77 year old male Height:      70 inches Weight:      188 pounds BMI:     27.07 Temp:     98.2 degrees F oral Pulse rate:   68 / minute Resp:     14 per minute BP sitting:   142 / 80  (left arm)  Vitals Entered By: Willy Eddy, LPN (Apr 15, 1609 9:40 AM)  CC:  roa after i ncreasing dosage on benazepril.  History of Present Illness: Three weeks into new medication with reductin in blood pressure to near goals home readings are near 135 systollic no side effects reported include=ing chest pain, cough or dizzyness mild flair of the rosasea ( nose)  Hypertension History:      He denies headache, chest pain, palpitations, dyspnea with exertion, orthopnea, PND, peripheral edema, visual symptoms, neurologic problems, syncope, and side effects from treatment.        Positive major cardiovascular risk factors include male age 25 years old or older, hyperlipidemia, and hypertension.  Negative major cardiovascular risk factors include non-tobacco-user status.        Further assessment for target organ damage reveals no history of ASHD, stroke/TIA, or peripheral vascular disease.     Current Problems (verified): 1)  Preventive Health Care  (ICD-V70.0) 2)  Actinic Keratosis  (ICD-702.0) 3)  Hyperlipidemia, Atherogenic  (ICD-272.4) 4)  Carcinoma, Basal Cell, Skin  (ICD-173.9) 5)  Rotator Cuff Tear  (ICD-727.61) 6)  Adhesive Capsulitis of Shoulder  (ICD-726.0) 7)  Erectile Dysfunction  (ICD-302.72) 8)  Unspec Disorders Bursae&tendons Shoulder Region  (ICD-726.10) 9)  Family History Diabetes 1st Degree Relative  (ICD-V18.0) 10)  Osteoarthritis  (ICD-715.90) 11)  Hypertension  (ICD-401.9)  Current Medications (verified): 1)  Benazepril-Hydrochlorothiazide 20-25 Mg Tabs (Benazepril-Hydrochlorothiazide) .... One By Mouth Daily 2)  Amlodipine Besylate 10 Mg Tabs (Amlodipine Besylate) .... One By Mouth Daily 3)  Fish Oil  Concentrate 1000 Mg Caps (Omega-3 Fatty Acids) .... One Two Times A Day  Allergies (verified): 1)  ! Celebrex  Past History:  Family History: Last updated: 07/17/2007 Family History of Arthritis Family History Diabetes 1st degree relative Family History Other cancer-Colon Fam hx DJD Fam hx CHF Fam hx CAD  Social History: Last updated: 07/17/2007 Former Smoker Alcohol use-yes Married  Risk Factors: Smoking Status: quit (02/11/2010)  Past medical, surgical, family and social histories (including risk factors) reviewed, and no changes noted (except as noted below).  Past Medical History: Reviewed history from 07/17/2007 and no changes required. Hypertension Osteoarthritis  Past Surgical History: Reviewed history from 12/11/2007 and no changes required. Total knee replacement  bilateral glacoma surgery  Family History: Reviewed history from 07/17/2007 and no changes required. Family History of Arthritis Family History Diabetes 1st degree relative Family History Other cancer-Colon Fam hx DJD Fam hx CHF Fam hx CAD  Social History: Reviewed history from 07/17/2007 and no changes required. Former Smoker Alcohol use-yes Married  Review of Systems  The patient denies anorexia, fever, weight loss, weight gain, vision loss, decreased hearing, hoarseness, chest pain, syncope, dyspnea on exertion, peripheral edema, prolonged cough, headaches, hemoptysis, abdominal pain, melena, hematochezia, severe indigestion/heartburn, hematuria, incontinence, genital sores, muscle weakness, suspicious skin lesions, transient blindness, difficulty walking, depression, unusual weight change, abnormal bleeding, enlarged lymph nodes, angioedema, breast masses, and testicular masses.    Physical Exam  General:  alert and well-hydrated.   Head:  normocephalic, atraumatic, and no  abnormalities palpated.   Eyes:  pupils equal and pupils round.   Ears:  R ear normal and L ear normal.   Nose:   external deformity and external erythema.   Neck:  No deformities, masses, or tenderness noted. Lungs:  normal respiratory effort and no wheezes.   Heart:  normal rate and regular rhythm.   Abdomen:  soft, non-tender, and normal bowel sounds.     Impression & Recommendations:  Problem # 1:  HYPERLIPIDEMIA, ATHEROGENIC (ICD-272.4) stable fish oil and diet Labs Reviewed: SGOT: 29 (02/11/2010)   SGPT: 29 (02/11/2010)  Lipid Goals: Chol Goal: 200 (02/03/2009)   HDL Goal: 40 (02/03/2009)   LDL Goal: 100 (02/03/2009)   TG Goal: 150 (02/03/2009)  10 Yr Risk Heart Disease: Not enough information Prior 10 Yr Risk Heart Disease: 33 % (02/03/2009)   HDL:57.10 (02/11/2010), 39.9 (01/27/2009)  LDL:DEL (01/27/2009), 124 (02/23/2008)  Chol:201 (02/11/2010), 214 (01/27/2009)  Trig:96.0 (02/11/2010), 140 (01/27/2009)  Problem # 2:  HYPERTENSION (ICD-401.9) Assessment: Improved stay the course with much improved numnber His updated medication list for this problem includes:    Benazepril-hydrochlorothiazide 20-25 Mg Tabs (Benazepril-hydrochlorothiazide) ..... One by mouth daily    Amlodipine Besylate 10 Mg Tabs (Amlodipine besylate) ..... One by mouth daily  BP today: 142/80 Prior BP: 170/100 (02/11/2010)  10 Yr Risk Heart Disease: Not enough information Prior 10 Yr Risk Heart Disease: 33 % (02/03/2009)  Labs Reviewed: K+: 4.5 (02/11/2010) Creat: : 0.9 (02/11/2010)   Chol: 201 (02/11/2010)   HDL: 57.10 (02/11/2010)   LDL: DEL (01/27/2009)   TG: 96.0 (02/11/2010)  Problem # 3:  ACNE ROSACEA (ICD-695.3) metrogel samples given pt to call if this works if not will return to dermatologist  Complete Medication List: 1)  Benazepril-hydrochlorothiazide 20-25 Mg Tabs (Benazepril-hydrochlorothiazide) .... One by mouth daily 2)  Amlodipine Besylate 10 Mg Tabs (Amlodipine besylate) .... One by mouth daily 3)  Fish Oil Concentrate 1000 Mg Caps (Omega-3 fatty acids) .... One two times a  day  Hypertension Assessment/Plan:      The patient's hypertensive risk group is category B: At least one risk factor (excluding diabetes) with no target organ damage.  Today's blood pressure is 142/80.  His blood pressure goal is < 140/90.  Patient Instructions: 1)  Please schedule a follow-up appointment in  5- 6 months. 2)  BMP prior to visit, ICD-9:401.9

## 2011-01-12 NOTE — Procedures (Signed)
Summary: Colonoscopy  Patient: Mitchell Walker Note: All result statuses are Final unless otherwise noted.  Tests: (1) Colonoscopy (COL)   COL Colonoscopy           DONE     [facility]     [address]     [place], [place]  [address]           COLONOSCOPY PROCEDURE REPORT           PATIENT:  Mitchell Walker, Mitchell Walker  MR#:  [id number]     BIRTHDATE:  12-30-1933, 75 yrs. old  GENDER:  male           ENDOSCOPIST:  Judie Petit T. Russella Dar, MD, [facility]     Referred by:  [name], M.D.           PROCEDURE DATE:  02/24/2010     PROCEDURE:  Colonoscopy with snare polypectomy     ASA CLASS:  Class II     INDICATIONS:  1) Routine Risk Screening           MEDICATIONS:   Fentanyl 50 mcg IV, Versed 5 mg IV           DESCRIPTION OF PROCEDURE:   After the risks benefits and     alternatives of the procedure were thoroughly explained, informed     consent was obtained.  Digital rectal exam was performed and     revealed no abnormalities.   The LB [id number]-[id number] endoscope     was introduced through the anus and advanced to the cecum, which     was identified by both the appendix and ileocecal valve, without     limitations.  The quality of the prep was good, using MoviPrep.     The instrument was then slowly withdrawn as the colon was fully     examined.     <<PROCEDUREIMAGES>>           FINDINGS:  Three polyps were found in the ascending colon. They     were 3 - 5 mm in size. Polyps were snared without cautery.     Retrieval was successful. A sessile polyp was found in the mid     transverse colon. It was 4 mm in size. Polyp was snared, then     cauterized with monopolar cautery. Retrieval was successful. A     sessile polyp was found in the descending colon. It was 4 mm in     size. Polyp was snared without cautery. Retrieval was successful.     A sessile polyp was found in the rectum. It was 4 mm in size. Polyp     was snared without cautery. Retrieval was successful. This was     otherwise  a normal examination of the colon. Retroflexed views in     the rectum revealed internal hemorrhoids, small. The time to cecum     =  2.25  minutes. The scope was then withdrawn (time =  12  min)     from the patient and the procedure completed.           COMPLICATIONS:  None           ENDOSCOPIC IMPRESSION:     1) 3 - 5 mm Three polyps in the ascending colon     2) 4 mm sessile polyp in the mid transverse colon     3) 4 mm sessile polyp in the descending colon     4) 4 mm  sessile polyp in the rectum     5) Internal hemorrhoids           RECOMMENDATIONS:     1) Await pathology results     2) If the polyps are adenomatous you should have a repeat     colonoscopy in 5 years otherwise 10 years.           Venita Lick. Russella Dar, MD, [facility]           n.     eSIGNED:   Venita Lick. Stark at 02/24/2010 11:37 AM           Stefano Gaul, [id number]  Note: An exclamation mark (!) indicates a result that was not dispersed into the flowsheet. Document Creation Date: 02/24/2010 11:38 AM _______________________________________________________________________  (1) Order result status: Final Collection or observation date-time: 02/24/2010 11:30 Requested date-time:  Receipt date-time:  Reported date-time:  Referring Physician:   Ordering Physician: Claudette Head [phone]) Specimen Source:  Source: Launa Grill Order Number: [id number] Lab site:   Appended Document: Colonoscopy     Procedures Next Due Date:    Colonoscopy: 02/2015

## 2011-01-12 NOTE — Letter (Signed)
Summary: Previsit letter  [facility]  [address] [place], [place] [address]   Phone: [phone]  Fax: [phone]       02/13/2010 MRN: [id number]  Mitchell Walker [address] [place], [place]  [address]  Dear Mr. [name],  Welcome to the Gastroenterology Division at [facility].    You are scheduled to see a nurse for your pre-procedure visit on 02-17-10 at 11:00a.m. on the 3rd floor at [facility], [address]  We ask that you try to arrive at our office 15 minutes prior to your appointment time to allow for check-in.  Your nurse visit will consist of discussing your medical and surgical history, your immediate family medical history, and your medications.    Please bring a complete list of all your medications or, if you prefer, bring the medication bottles and we will list them.  We will need to be aware of both prescribed and over the counter drugs.  We will need to know exact dosage information as well.  If you are on blood thinners (Coumadin, Plavix, Aggrenox, Ticlid, etc.) please call our office today/prior to your appointment, as we need to consult with your physician about holding your medication.   Please be prepared to read and sign documents such as consent forms, a financial agreement, and acknowledgement forms.  If necessary, and with your consent, a friend or relative is welcome to sit-in on the nurse visit with you.  Please bring your insurance card so that we may make a copy of it.  If your insurance requires a referral to see a specialist, please bring your referral form from your primary care physician.  No co-pay is required for this nurse visit.     If you cannot keep your appointment, please call [phone] to cancel or reschedule prior to your appointment date.  This allows [place] the opportunity to schedule an appointment for another patient in need of care.    Thank you for choosing  Gastroenterology for your medical needs.  We  appreciate the opportunity to care for you.  Please visit [place] at our website  to learn more about our practice.                     Sincerely.                                                                                                                   The Gastroenterology Division

## 2011-01-12 NOTE — Assessment & Plan Note (Signed)
Summary: ABD PAIN // RS/RSC PER BONNYE/CJR   Vital Signs:  Patient profile:   77 year old male Height:      70 inches Weight:      158 pounds BMI:     22.75 Temp:     98.4 degrees F oral Pulse rate:   76 / minute Resp:     14 per minute BP sitting:   140 / 80  (left arm)  Vitals Entered By: Willy Eddy, LPN (Apr 29, 2010 11:09 AM) CC: left groin pain-very painful when walking- pain is only sx-stated yesterday am and had walked up long ramp the night before   CC:  left groin pain-very painful when walking- pain is only sx-stated yesterday am and had walked up long ramp the night before.  History of Present Illness: went up  an incline in a parking garrage and the next AM he had groid pain and stiff in the left groin raising the leg give pain on the left side that radiates into the testicle the pain is near the femoral triangle he also has rosasea and was given samples of metrogel with good treatment sucess  Preventive Screening-Counseling & Management  Alcohol-Tobacco     Smoking Status: quit  Problems Prior to Update: 1)  Acne Rosacea  (ICD-695.3) 2)  Preventive Health Care  (ICD-V70.0) 3)  Actinic Keratosis  (ICD-702.0) 4)  Hyperlipidemia, Atherogenic  (ICD-272.4) 5)  Carcinoma, Basal Cell, Skin  (ICD-173.9) 6)  Rotator Cuff Tear  (ICD-727.61) 7)  Adhesive Capsulitis of Shoulder  (ICD-726.0) 8)  Erectile Dysfunction  (ICD-302.72) 9)  Unspec Disorders Bursae&tendons Shoulder Region  (ICD-726.10) 10)  Family History Diabetes 1st Degree Relative  (ICD-V18.0) 11)  Osteoarthritis  (ICD-715.90) 12)  Hypertension  (ICD-401.9)  Current Problems (verified): 1)  Acne Rosacea  (ICD-695.3) 2)  Preventive Health Care  (ICD-V70.0) 3)  Actinic Keratosis  (ICD-702.0) 4)  Hyperlipidemia, Atherogenic  (ICD-272.4) 5)  Carcinoma, Basal Cell, Skin  (ICD-173.9) 6)  Rotator Cuff Tear  (ICD-727.61) 7)  Adhesive Capsulitis of Shoulder  (ICD-726.0) 8)  Erectile Dysfunction   (ICD-302.72) 9)  Unspec Disorders Bursae&tendons Shoulder Region  (ICD-726.10) 10)  Family History Diabetes 1st Degree Relative  (ICD-V18.0) 11)  Osteoarthritis  (ICD-715.90) 12)  Hypertension  (ICD-401.9)  Medications Prior to Update: 1)  Benazepril-Hydrochlorothiazide 20-25 Mg Tabs (Benazepril-Hydrochlorothiazide) .... One By Mouth Daily 2)  Amlodipine Besylate 10 Mg Tabs (Amlodipine Besylate) .... One By Mouth Daily 3)  Fish Oil Concentrate 1000 Mg Caps (Omega-3 Fatty Acids) .... One Two Times A Day  Current Medications (verified): 1)  Benazepril-Hydrochlorothiazide 20-25 Mg Tabs (Benazepril-Hydrochlorothiazide) .... One By Mouth Daily 2)  Amlodipine Besylate 10 Mg Tabs (Amlodipine Besylate) .... One By Mouth Daily 3)  Fish Oil Concentrate 1000 Mg Caps (Omega-3 Fatty Acids) .... One Two Times A Day  Allergies (verified): 1)  ! Celebrex  Past History:  Family History: Last updated: 07/17/2007 Family History of Arthritis Family History Diabetes 1st degree relative Family History Other cancer-Colon Fam hx DJD Fam hx CHF Fam hx CAD  Social History: Last updated: 07/17/2007 Former Smoker Alcohol use-yes Married  Risk Factors: Smoking Status: quit (04/29/2010)  Past medical, surgical, family and social histories (including risk factors) reviewed, and no changes noted (except as noted below).  Past Medical History: Hypertension Osteoarthritis rosasea  Past Surgical History: Reviewed history from 12/11/2007 and no changes required. Total knee replacement  bilateral glacoma surgery  Family History: Reviewed history from 07/17/2007 and no changes required. Family History  of Arthritis Family History Diabetes 1st degree relative Family History Other cancer-Colon Fam hx DJD Fam hx CHF Fam hx CAD  Social History: Reviewed history from 07/17/2007 and no changes required. Former Smoker Alcohol use-yes Married  Review of Systems  The patient denies anorexia,  fever, weight loss, weight gain, vision loss, decreased hearing, hoarseness, chest pain, syncope, dyspnea on exertion, peripheral edema, prolonged cough, headaches, hemoptysis, abdominal pain, melena, hematochezia, severe indigestion/heartburn, hematuria, incontinence, genital sores, muscle weakness, suspicious skin lesions, transient blindness, difficulty walking, depression, unusual weight change, abnormal bleeding, enlarged lymph nodes, angioedema, breast masses, and testicular masses.    Physical Exam  General:  alert and well-hydrated.   Head:  normocephalic, atraumatic, and no abnormalities palpated.   Eyes:  pupils equal and pupils round.   Ears:  R ear normal and L ear normal.   Nose:  external deformity and external erythema.   Mouth:  Oral mucosa and oropharynx without lesions or exudates.  Teeth in good repair. Neck:  No deformities, masses, or tenderness noted. Lungs:  normal respiratory effort and no wheezes.   Heart:  normal rate and regular rhythm.   Abdomen:  soft, non-tender, and normal bowel sounds.   Genitalia:  circumcised and no urethral discharge.   Prostate:  boggy and 1+ enlarged.   Msk:  No deformity or scoliosis noted of thoracic or lumbar spine.  normal ROM.   Pulses:  R and L carotid,radial,femoral,dorsalis pedis and posterior tibial pulses are full and equal bilaterally Extremities:  No clubbing, cyanosis, edema, or deformity noted with normal full range of motion of all joints.   Neurologic:  alert & oriented X3 and gait normal.     Impression & Recommendations:  Problem # 1:  GROIN STRAIN (ICD-848.8) vimovo and heat to site two times a day  Problem # 2:  ACNE ROSACEA (ICD-695.3)  Complete Medication List: 1)  Benazepril-hydrochlorothiazide 20-25 Mg Tabs (Benazepril-hydrochlorothiazide) .... One by mouth daily 2)  Amlodipine Besylate 10 Mg Tabs (Amlodipine besylate) .... One by mouth daily 3)  Fish Oil Concentrate 1000 Mg Caps (Omega-3 fatty acids) ....  One two times a day 4)  Metrogel 1 % Gel (Metronidazole) .... Apply to face two times a day as  directed 5)  Vimovo 500-20 Mg Tbec (Naproxen-esomeprazole) .... One by mouth two times a day  Patient Instructions: 1)  Please schedule a follow-up appointment as needed. 2)  You may move around but avoid painful motions. Apply ice to sore area for 20 minutes 3-4 times a day for 2-3 days. Prescriptions: METROGEL 1 % GEL (METRONIDAZOLE) apply to face two times a day as  directed  #60gm x 4   Entered and Authorized by:   [name] MD   Signed by:   [name] MD on 04/29/2010   Method used:   Electronically to        [facility]  [phone]* (retail)       [address]       [place], [place]  [address]       Ph: [phone] or [phone]       Fax: [phone]   RxID:   [id number]

## 2011-01-12 NOTE — Progress Notes (Signed)
Summary: low pelvic pain  Phone Note Call from Patient   Reason for Call: Acute Illness Summary of Call: Was scheduled 2:45 pm to see docto Burchette.  Abdominal pain started yesterday.  Low pelvic area.  So bad he can hardly walk.  Took ASA that has helped some with pain relief.  No urinary blood seen.  Had normal BM yesterday.  No fever.  Will keep appointment.  Call back as needed or go to ER if symptoms worsen.  Ice pack to area.  Clear liquids meanwhile.  Initial call taken by: Rudy Jew, RN,  Apr 29, 2010 9:32 AM  Follow-up for Phone Call        dr Lovell Sheehan had 11 am cx- pt coming then Follow-up by: Willy Eddy, LPN,  Apr 29, 2010 9:49 AM

## 2011-01-12 NOTE — Assessment & Plan Note (Signed)
Summary: CYST ON TAILBONE/NJR   Vital Signs:  Patient profile:   77 year old male Height:      70 inches Weight:      158 pounds BMI:     22.75 Temp:     98 degrees F oral Pulse rate:   76 / minute Resp:     14 per minute BP sitting:   136 / 80  (left arm)  Vitals Entered By: Willy Eddy, LPN (August 12, 2010 4:15 PM) CC: ROa bp check- thinks he has hemorrhoids Is Patient Diabetic? No   CC:  ROa bp check- thinks he has hemorrhoids.  History of Present Illness: pain in anal area with swollen area that he feels may be hemorhoidal but he is concerned due to hx of colon cancer in family ( saw blood) prep H helped but is still painfull no current bleeding just pain large painfull lump at base of anus   Preventive Screening-Counseling & Management  Alcohol-Tobacco     Smoking Status: quit     Tobacco Counseling: to remain off tobacco products  Problems Prior to Update: 1)  External Hemorrhoids, Thrombosed  (ICD-455.4) 2)  Acne Rosacea  (ICD-695.3) 3)  Preventive Health Care  (ICD-V70.0) 4)  Actinic Keratosis  (ICD-702.0) 5)  Hyperlipidemia, Atherogenic  (ICD-272.4) 6)  Carcinoma, Basal Cell, Skin  (ICD-173.9) 7)  Rotator Cuff Tear  (ICD-727.61) 8)  Adhesive Capsulitis of Shoulder  (ICD-726.0) 9)  Erectile Dysfunction  (ICD-302.72) 10)  Unspec Disorders Bursae&tendons Shoulder Region  (ICD-726.10) 11)  Family History Diabetes 1st Degree Relative  (ICD-V18.0) 12)  Osteoarthritis  (ICD-715.90) 13)  Hypertension  (ICD-401.9)  Current Problems (verified): 1)  Acne Rosacea  (ICD-695.3) 2)  Preventive Health Care  (ICD-V70.0) 3)  Actinic Keratosis  (ICD-702.0) 4)  Hyperlipidemia, Atherogenic  (ICD-272.4) 5)  Carcinoma, Basal Cell, Skin  (ICD-173.9) 6)  Rotator Cuff Tear  (ICD-727.61) 7)  Adhesive Capsulitis of Shoulder  (ICD-726.0) 8)  Erectile Dysfunction  (ICD-302.72) 9)  Unspec Disorders Bursae&tendons Shoulder Region  (ICD-726.10) 10)  Family History Diabetes  1st Degree Relative  (ICD-V18.0) 11)  Osteoarthritis  (ICD-715.90) 12)  Hypertension  (ICD-401.9)  Medications Prior to Update: 1)  Benazepril-Hydrochlorothiazide 20-25 Mg Tabs (Benazepril-Hydrochlorothiazide) .... One By Mouth Daily 2)  Amlodipine Besylate 10 Mg Tabs (Amlodipine Besylate) .... One By Mouth Daily 3)  Fish Oil Concentrate 1000 Mg Caps (Omega-3 Fatty Acids) .... One Two Times A Day 4)  Metrogel 1 % Gel (Metronidazole) .... Apply To Face Two Times A Day As  Directed 5)  Vimovo 500-20 Mg Tbec (Naproxen-Esomeprazole) .... One By Mouth Two Times A Day  Current Medications (verified): 1)  Benazepril-Hydrochlorothiazide 20-25 Mg Tabs (Benazepril-Hydrochlorothiazide) .... One By Mouth Daily 2)  Amlodipine Besylate 10 Mg Tabs (Amlodipine Besylate) .... One By Mouth Daily 3)  Fish Oil Concentrate 1000 Mg Caps (Omega-3 Fatty Acids) .... One Two Times A Day 4)  Metrogel 1 % Gel (Metronidazole) .... Apply To Face Two Times A Day As  Directed 5)  Vimovo 500-20 Mg Tbec (Naproxen-Esomeprazole) .... One By Mouth Two Times A Day 6)  Percocet 5-325 Mg Tabs (Oxycodone-Acetaminophen) .... One By Mouth Every 6 Hours  Allergies (verified): 1)  ! Celebrex  Past History:  Family History: Last updated: 07/17/2007 Family History of Arthritis Family History Diabetes 1st degree relative Family History Other cancer-Colon Fam hx DJD Fam hx CHF Fam hx CAD  Social History: Last updated: 07/17/2007 Former Smoker Alcohol use-yes Married  Risk Factors: Smoking Status:  quit (08/12/2010)  Past medical, surgical, family and social histories (including risk factors) reviewed, and no changes noted (except as noted below).  Past Medical History: Reviewed history from 04/29/2010 and no changes required. Hypertension Osteoarthritis rosasea  Past Surgical History: Reviewed history from 12/11/2007 and no changes required. Total knee replacement  bilateral glacoma surgery  Family  History: Reviewed history from 07/17/2007 and no changes required. Family History of Arthritis Family History Diabetes 1st degree relative Family History Other cancer-Colon Fam hx DJD Fam hx CHF Fam hx CAD  Social History: Reviewed history from 07/17/2007 and no changes required. Former Smoker Alcohol use-yes Married  Review of Systems  The patient denies anorexia, fever, weight loss, weight gain, vision loss, decreased hearing, hoarseness, chest pain, syncope, dyspnea on exertion, peripheral edema, prolonged cough, headaches, hemoptysis, abdominal pain, melena, hematochezia, severe indigestion/heartburn, hematuria, incontinence, genital sores, muscle weakness, suspicious skin lesions, transient blindness, difficulty walking, depression, unusual weight change, abnormal bleeding, enlarged lymph nodes, angioedema, breast masses, and testicular masses.         rectal pain  Physical Exam  General:  alert and well-hydrated.   Head:  normocephalic, atraumatic, and no abnormalities palpated.   Eyes:  pupils equal and pupils round.   Lungs:  normal respiratory effort and no wheezes.   Heart:  normal rate and regular rhythm.   Abdomen:  soft, non-tender, and normal bowel sounds.   Rectal:  external hemorrhoid(s).  3 cm inflamed and tender   Impression & Recommendations:  Problem # 1:  EXTERNAL HEMORRHOIDS, THROMBOSED (ICD-455.4) Assessment New informd consent obtained the site was prepped with betadyne and using a 15 blade the hemorrhoid was lanced  ( a 1.5 cm eliptical incision) 2cm thrombosed hemorrhoid clots removed and would care with sitz baths and packing discussed  Orders: Excise Thrombotic Hemorrhoid (04540)  Problem # 2:  HYPERTENSION (ICD-401.9) Assessment: Unchanged  His updated medication list for this problem includes:    Benazepril-hydrochlorothiazide 20-25 Mg Tabs (Benazepril-hydrochlorothiazide) ..... One by mouth daily    Amlodipine Besylate 10 Mg Tabs  (Amlodipine besylate) ..... One by mouth daily  BP today: 136/80 Prior BP: 140/80 (04/29/2010)  Prior 10 Yr Risk Heart Disease: Not enough information (04/15/2010)  Labs Reviewed: K+: 4.5 (02/11/2010) Creat: : 0.9 (02/11/2010)   Chol: 201 (02/11/2010)   HDL: 57.10 (02/11/2010)   LDL: DEL (01/27/2009)   TG: 96.0 (02/11/2010)  Complete Medication List: 1)  Benazepril-hydrochlorothiazide 20-25 Mg Tabs (Benazepril-hydrochlorothiazide) .... One by mouth daily 2)  Amlodipine Besylate 10 Mg Tabs (Amlodipine besylate) .... One by mouth daily 3)  Fish Oil Concentrate 1000 Mg Caps (Omega-3 fatty acids) .... One two times a day 4)  Metrogel 1 % Gel (Metronidazole) .... Apply to face two times a day as  directed 5)  Vimovo 500-20 Mg Tbec (Naproxen-esomeprazole) .... One by mouth two times a day 6)  Percocet 5-325 Mg Tabs (Oxycodone-acetaminophen) .... One by mouth every 6 hours  Patient Instructions: 1)  use the percocet every 6 hours for pain 2)  15 min sitz baths twice a day for 2-3 days

## 2011-01-14 ENCOUNTER — Telehealth: Payer: Self-pay | Admitting: *Deleted

## 2011-01-14 NOTE — Telephone Encounter (Signed)
Wife informed that Camelia Eng will be calling them for CT, labs and ENT appts.

## 2011-01-14 NOTE — Telephone Encounter (Signed)
Pt is still having sinus infections that none of the meds have helped.  What should he do next?

## 2011-01-14 NOTE — Progress Notes (Signed)
Summary: Mitchell Walker please call  Phone Note Call from Patient Call back at Home Phone [phone]   Caller: Patient Call For: [name] MD Summary of Call: pt would like Mitchell Walker to return call. Pt has sch ov 12-30-2010. Pt has sinus inf pt decline to see another doc  Initial call taken by: Heron Sabins,  December 21, 2010 1:34 PM  Follow-up for Phone Call        per dr Lovell Sheehan- may have z pack Follow-up by: Willy Eddy, LPN,  December 21, 2010 2:09 PM    New/Updated Medications: ZITHROMAX Z-PAK 250 MG TABS (AZITHROMYCIN) as directed Prescriptions: ZITHROMAX Z-PAK 250 MG TABS (AZITHROMYCIN) as directed  #6 x 0   Entered by:   Lynann Beaver CMA AAMA   Authorized by:   [name] MD   Signed by:   Lynann Beaver CMA AAMA on 12/21/2010   Method used:   Electronically to        [facility]  [phone]* (retail)       [address]       [place], [place]  [address]       Ph: [phone] or [phone]       Fax: [phone]   RxID:   [id number]  Notified pt.

## 2011-01-14 NOTE — Assessment & Plan Note (Signed)
Summary: 2 month fup//ccm   Vital Signs:  Patient profile:   77 year old male Height:      70 inches Weight:      185 pounds BMI:     26.64 Temp:     98.2 degrees F oral Pulse rate:   68 / minute Resp:     14 per minute BP sitting:   124 / 80  (left arm)  Vitals Entered By: Willy Eddy, LPN (December 30, 2010 10:45 AM) CC: f/u rosacea- rash deveoped after 2-3 weeks of septra ds--has taken 2 rounds of z pack for sinusitis and continues to have symptons Is Patient Diabetic? No   Primary Care Provider:  [name] MD  CC:  f/u rosacea- rash deveoped after 2-3 weeks of septra ds--has taken 2 rounds of z pack for sinusitis and continues to have symptons.  History of Present Illness: treated for sinusitus and was noted to have an allergic reaction ( rash) for the sinus Still congested. The rosacia is improved  Preventive Screening-Counseling & Management  Alcohol-Tobacco     Smoking Status: quit     Tobacco Counseling: to remain off tobacco products  Current Problems (verified): 1)  Skin Rash  (ICD-782.1) 2)  Musculoskeletal Pain  (ICD-781.99) 3)  Uri  (ICD-465.9) 4)  External Hemorrhoids, Thrombosed  (ICD-455.4) 5)  Acne Rosacea  (ICD-695.3) 6)  Preventive Health Care  (ICD-V70.0) 7)  Actinic Keratosis  (ICD-702.0) 8)  Hyperlipidemia, Atherogenic  (ICD-272.4) 9)  Carcinoma, Basal Cell, Skin  (ICD-173.9) 10)  Rotator Cuff Tear  (ICD-727.61) 11)  Adhesive Capsulitis of Shoulder  (ICD-726.0) 12)  Erectile Dysfunction  (ICD-302.72) 13)  Unspec Disorders Bursae&tendons Shoulder Region  (ICD-726.10) 14)  Family History Diabetes 1st Degree Relative  (ICD-V18.0) 15)  Osteoarthritis  (ICD-715.90) 16)  Hypertension  (ICD-401.9)  Current Medications (verified): 1)  Benazepril-Hydrochlorothiazide 20-25 Mg Tabs (Benazepril-Hydrochlorothiazide) .... One By Mouth Daily 2)  Amlodipine Besylate 10 Mg Tabs (Amlodipine Besylate) .... One By Mouth Daily 3)  Fish Oil Concentrate  1000 Mg Caps (Omega-3 Fatty Acids) .... One Two Times A Day 4)  Metrogel 1 % Gel (Metronidazole) .... Apply To Face Two Times A Day As  Directed 5)  Cyclobenzaprine Hcl 10 Mg Tabs (Cyclobenzaprine Hcl) .Marland Kitchen.. 1 Three Times A Day As Needed 6)  Vimovo 375-20 Mg Tbec (Naproxen-Esomeprazole) .... As Needed Groin Pain  Allergies (verified): 1)  ! Celebrex 2)  Sulfa  Past History:  Family History: Last updated: 07/17/2007 Family History of Arthritis Family History Diabetes 1st degree relative Family History Other cancer-Colon Fam hx DJD Fam hx CHF Fam hx CAD  Social History: Last updated: 07/17/2007 Former Smoker Alcohol use-yes Married  Risk Factors: Smoking Status: quit (12/30/2010)  Past medical, surgical, family and social histories (including risk factors) reviewed, and no changes noted (except as noted below).  Past Medical History: Reviewed history from 04/29/2010 and no changes required. Hypertension Osteoarthritis rosasea  Past Surgical History: Reviewed history from 12/11/2007 and no changes required. Total knee replacement  bilateral glacoma surgery  Family History: Reviewed history from 07/17/2007 and no changes required. Family History of Arthritis Family History Diabetes 1st degree relative Family History Other cancer-Colon Fam hx DJD Fam hx CHF Fam hx CAD  Social History: Reviewed history from 07/17/2007 and no changes required. Former Smoker Alcohol use-yes Married  Review of Systems       The patient complains of hoarseness and prolonged cough.  The patient denies anorexia, fever, weight loss, weight gain,  vision loss, decreased hearing, chest pain, syncope, dyspnea on exertion, peripheral edema, headaches, hemoptysis, abdominal pain, melena, hematochezia, severe indigestion/heartburn, hematuria, incontinence, genital sores, muscle weakness, suspicious skin lesions, transient blindness, difficulty walking, depression, unusual weight change, abnormal  bleeding, enlarged lymph nodes, angioedema, breast masses, and testicular masses.    Physical Exam  General:  Well-developed,well-nourished,in no acute distress; alert,appropriate and cooperative throughout examination Head:  Normocephalic and atraumatic without obvious abnormalities. No apparent alopecia or balding. Eyes:  pupils equal and pupils round.   Ears:  R ear normal and L ear normal.   Nose:  nasal dischargemucosal pallor and mucosal edema.   Mouth:  posterior lymphoid hypertrophy and postnasal drip.   Neck:  No deformities, masses, or tenderness noted. Lungs:  Normal respiratory effort, chest expands symmetrically. Lungs are clear to auscultation, no crackles or wheezes. Heart:  normal rate and regular rhythm.     Impression & Recommendations:  Problem # 1:  SKIN RASH (ICD-782.1) Assessment Improved  due to sulfa... resolved allergies update  Discussed medication use and symptom control.   Problem # 2:  ACNE ROSACEA (ICD-695.3) metrogel to nose two times a day  Problem # 3:  CHRONIC FRONTAL SINUSITIS (ICD-473.1) Assessment: New  has failed treatment with zpack x 2 purulent nasal drainage from front sinus with systemic complaints  The following medications were removed from the medication list:    Zithromax Z-pak 250 Mg Tabs (Azithromycin) .Marland Kitchen... As directed His updated medication list for this problem includes:    Levofloxacin 500 Mg Tabs (Levofloxacin) ..... One by mouth daily for 14 days  Take antibiotics for full duration. Discussed treatment options including indications for coronal CT scan of sinuses and ENT referral.   Problem # 4:  HYPERTENSION (ICD-401.9) Assessment: Improved  His updated medication list for this problem includes:    Benazepril-hydrochlorothiazide 20-25 Mg Tabs (Benazepril-hydrochlorothiazide) ..... One by mouth daily    Amlodipine Besylate 10 Mg Tabs (Amlodipine besylate) ..... One by mouth daily  BP today: 124/80 Prior BP: 122/64  (11/23/2010)  Prior 10 Yr Risk Heart Disease: Not enough information (04/15/2010)  Labs Reviewed: K+: 4.8 (10/19/2010) Creat: : 1.0 (10/19/2010)   Chol: 201 (02/11/2010)   HDL: 57.10 (02/11/2010)   LDL: DEL (01/27/2009)   TG: 96.0 (02/11/2010)  Complete Medication List: 1)  Benazepril-hydrochlorothiazide 20-25 Mg Tabs (Benazepril-hydrochlorothiazide) .... One by mouth daily 2)  Amlodipine Besylate 10 Mg Tabs (Amlodipine besylate) .... One by mouth daily 3)  Fish Oil Concentrate 1000 Mg Caps (Omega-3 fatty acids) .... One two times a day 4)  Metrogel 1 % Gel (Metronidazole) .... Apply to face two times a day as  directed 5)  Cyclobenzaprine Hcl 10 Mg Tabs (Cyclobenzaprine hcl) .Marland Kitchen.. 1 three times a day as needed 6)  Vimovo 375-20 Mg Tbec (Naproxen-esomeprazole) .... As needed groin pain 7)  Levofloxacin 500 Mg Tabs (Levofloxacin) .... One by mouth daily for 14 days  Patient Instructions: 1)  Take your antibiotic as prescribed until ALL of it is gone, but stop if you develop a rash or swelling and contact our office as soon as possible. 2)  Please schedule a follow-up appointment in 4 months. medicare welness visit Prescriptions: LEVOFLOXACIN 500 MG TABS (LEVOFLOXACIN) one by mouth daily for 14 days  #14 x 0   Entered and Authorized by:   [name] MD   Signed by:   [name] MD on 12/30/2010   Method used:   Electronically to  [facility]  [address]  [phone]* (retail)       [address]       [place], [place]  [address]       Ph: [phone] or [phone]       Fax: [phone]   RxID:   [id number] AMLODIPINE BESYLATE 10 MG TABS (AMLODIPINE BESYLATE) one by mouth daily  #90 x 3   Entered by:   Willy Eddy, LPN   Authorized by:   [name] MD   Signed by:   Willy Eddy, LPN on 47/42/5956   Method used:   Print then Give to Patient   RxID:   [id number] BENAZEPRIL-HYDROCHLOROTHIAZIDE 20-25 MG TABS  (BENAZEPRIL-HYDROCHLOROTHIAZIDE) one by mouth daily  #90 x 3   Entered by:   Willy Eddy, LPN   Authorized by:   [name] MD   Signed by:   Willy Eddy, LPN on 66/05/3015   Method used:   Print then Give to Patient   RxID:   [id number]    Orders Added: 1)  Est. Patient Level IV [42706]

## 2011-01-14 NOTE — Assessment & Plan Note (Signed)
Summary: SINUSITIS? / PULLED GROIN MUSCLE? // RS   Vital Signs:  Patient profile:   77 year old male Weight:      186 pounds Temp:     98.1 degrees F oral Pulse rate:   62 / minute Pulse rhythm:   regular BP sitting:   122 / 64  Vitals Entered By: Lynann Beaver CMA AAMA (November 23, 2010 9:26 AM) CC: Taking Septra and has developed rash and itching on chest with sinus congestion Is Patient Diabetic? No Pain Assessment Patient in pain? no        Primary Care [facility]:  [name] MD  CC:  Taking Septra and has developed rash and itching on chest with sinus congestion.  History of Present Illness: Patient presents to clinic as a workin for evaluation of rash, groin pain and URI symptoms. States one week h/o nasal congestion and mild np cough. Denies f/c, dyspnea or sick exposure. Has been taking septra for rosacea and since then notices a red raised pruritic rash along trunk. No spread of the rash and denies dysphagia/tongue swelling/dyspnea.  Also has several day h/o left groin pain that he feels is muscular. Denies bulge or urinary sx such as dysuria or freq. Pain does not radiate to testicle or down leg.  No trauma/injury trigger but is positional. Doesn't affect ambulation and is improved with q12 hour samples of vivimo without gi adverse effect.  Current Medications (verified): 1)  Benazepril-Hydrochlorothiazide 20-25 Mg Tabs (Benazepril-Hydrochlorothiazide) .... One By Mouth Daily 2)  Amlodipine Besylate 10 Mg Tabs (Amlodipine Besylate) .... One By Mouth Daily 3)  Fish Oil Concentrate 1000 Mg Caps (Omega-3 Fatty Acids) .... One Two Times A Day 4)  Metrogel 1 % Gel (Metronidazole) .... Apply To Face Two Times A Day As  Directed 5)  Sulfamethoxazole-Tmp Ds 800-160 Mg Tabs (Sulfamethoxazole-Trimethoprim) .... One By Mouth Two Times A Day 6)  Cyclobenzaprine Hcl 10 Mg Tabs (Cyclobenzaprine Hcl) .Marland Kitchen.. 1 Three Times A Day As Needed 7)  Vimovo 375-20 Mg Tbec  (Naproxen-Esomeprazole) .... As Needed Groin Pain  Allergies (verified): 1)  ! Celebrex  Review of Systems      See HPI  Physical Exam  General:  Well-developed,well-nourished,in no acute distress; alert,appropriate and cooperative throughout examination Head:  Normocephalic and atraumatic without obvious abnormalities. No apparent alopecia or balding. Eyes:  pupils equal and pupils round.   Ears:  External ear exam shows no significant lesions or deformities.  Otoscopic examination reveals clear canals, tympanic membranes are intact bilaterally without bulging, retraction, inflammation or discharge. Hearing is grossly normal bilaterally. Nose:  no external deformity, no airflow obstruction, mucosal erythema, mucosal edema, mucosal friability, and active bleeding or clots.   Mouth:  Oral mucosa and oropharynx without lesions or exudates.  Teeth in good repair. Neck:  No deformities, masses, or tenderness noted. Lungs:  Normal respiratory effort, chest expands symmetrically. Lungs are clear to auscultation, no crackles or wheezes. Msk:  Ambulates well without assistance. Nl gait.  Mild reproducible tenderness to palpation left inguinal area. No mass. FROM of left upper leg. Skin:  Anterior trunck and posterior lower 1/2 back demonstrates scattered erythematout papular rash. No dermatomal distribution.    Impression & Recommendations:  Problem # 1:  URI (ICD-465.9) Assessment New Suspect possible viral etiology currently though begin abx as prescribed if no improvement of signs after total duration of 8-10 days. Follwoup if no improvement or worsening.  Problem # 2:  MUSCULOSKELETAL PAIN (ICD-781.99) Assessment: New Hx and exam  suggest msk etiology. Improving with nsaid. Additional samples ov vivimo provided. Followup if no resolution or worsening.  Problem # 3:  SKIN RASH (ICD-782.1) Assessment: New Suspect allergic etiology from recent septra. No other worrisome s/s. DC septra and  followup if no resolution of rash.  Complete Medication List: 1)  Benazepril-hydrochlorothiazide 20-25 Mg Tabs (Benazepril-hydrochlorothiazide) .... One by mouth daily 2)  Amlodipine Besylate 10 Mg Tabs (Amlodipine besylate) .... One by mouth daily 3)  Fish Oil Concentrate 1000 Mg Caps (Omega-3 fatty acids) .... One two times a day 4)  Metrogel 1 % Gel (Metronidazole) .... Apply to face two times a day as  directed 5)  Cyclobenzaprine Hcl 10 Mg Tabs (Cyclobenzaprine hcl) .Marland Kitchen.. 1 three times a day as needed 6)  Vimovo 375-20 Mg Tbec (Naproxen-esomeprazole) .... As needed groin pain 7)  Zithromax Z-pak 250 Mg Tabs (Azithromycin) .... As directed  Patient Instructions: 1)  Get plenty of rest, drink lots of clear liquids, and use Tylenol or Ibuprofen for fever and comfort. Return in 7-10 days if you're not better:sooner if you're feeling worse. Prescriptions: ZITHROMAX Z-PAK 250 MG TABS (AZITHROMYCIN) as directed  #1 x 0   Entered and Authorized by:   Edwyna Perfect MD   Signed by:   Edwyna Perfect MD on 11/23/2010   Method used:   Print then Give to Patient   RxID:   [id number]    Orders Added: 1)  Est. Patient Level IV [14782]

## 2011-01-14 NOTE — Telephone Encounter (Signed)
Patient has failed 2 courses of antibiotics and now is requesting a third course we need a CT of the sinuses to determine if he has an abscess and we need to go ahead and refer him to an ear nose and throat simply we will schedule we will put orders in today for a limited CT of the sinuses it is possible and see if we can get him into the ear nose and throat doctor in a few days. He'll need to come to the lab for creatinine and BUN prior to the CT scan.

## 2011-01-14 NOTE — Telephone Encounter (Deleted)
The patient is felt to courses of antibiotics and will need to be referred to an ear nose and throat doctor the referral has been placed in the orders please notify patient that he will be notified about her appointment with an ear nose and throat doctors as possible

## 2011-01-21 ENCOUNTER — Other Ambulatory Visit: Payer: Self-pay

## 2011-01-27 ENCOUNTER — Ambulatory Visit (INDEPENDENT_AMBULATORY_CARE_PROVIDER_SITE_OTHER)
Admission: RE | Admit: 2011-01-27 | Discharge: 2011-01-27 | Disposition: A | Discharge status: Home / Self Care | Payer: Medicare Other | Source: Ambulatory Visit | Attending: Internal Medicine | Admitting: Internal Medicine

## 2011-01-27 ENCOUNTER — Encounter: Payer: Self-pay | Admitting: Internal Medicine

## 2011-01-27 ENCOUNTER — Ambulatory Visit (INDEPENDENT_AMBULATORY_CARE_PROVIDER_SITE_OTHER): Payer: Medicare Other | Admitting: Internal Medicine

## 2011-01-27 VITALS — BP 156/78 | HR 76 | Temp 98.2°F | Resp 14 | Ht 68.0 in | Wt 188.0 lb

## 2011-01-27 DIAGNOSIS — M542 Cervicalgia: Secondary | ICD-10-CM

## 2011-01-27 DIAGNOSIS — I10 Essential (primary) hypertension: Secondary | ICD-10-CM

## 2011-01-27 DIAGNOSIS — M5412 Radiculopathy, cervical region: Secondary | ICD-10-CM

## 2011-01-27 MED ORDER — PREDNISONE 10 MG PO TABS: 10.0000 mg | ORAL_TABLET | ORAL | Status: AC

## 2011-01-27 MED ORDER — CYCLOBENZAPRINE HCL 10 MG PO TABS: 10.0000 mg | ORAL_TABLET | Freq: Three times a day (TID) | ORAL | Status: AC | PRN

## 2011-01-27 NOTE — Assessment & Plan Note (Signed)
He has had a history of chronic neck and shoulder pain which may have been a foreshadowing of cervical disc disease.  The acute nature of this problem suggests that either he has spondylosis or acute rupture of a cervical disc probably at 045 range.    he'll be treated with conservative therapy of ice then heat a steroid burst and taper and a muscle relaxer and plain films will be obtained today should the pain persist or the plain films suggest significant loss of intermittent vertebral height an MRI should be performed in consideration for referral 2 neurosurgeon should he respond to conservative therapy then physical therapy may be a referral

## 2011-01-27 NOTE — Assessment & Plan Note (Signed)
The elevation of his current blood pressure is most likely due to his chronic pain with acute increase in pain today he is instructed to monitor his blood pressure on a daily basis and should it remain elevated call for return office visit. Should he develop any severe headache chest discomfort chest pain or exertional shortness of breath he should go to the emergency room

## 2011-01-27 NOTE — Progress Notes (Signed)
  Subjective:    Patient ID: Mitchell Walker, male    DOB: [birth year], 77 y.o.   MRN: [id number]  HPI The pt has stiffness in his neck with sharp pain and numbness in his left arm. Worse upon awakening. Better with movement.  He pt reports no acute injury. The pain began 3-4 days ago. Has noticed some swallowing issues ( pills). Has no cough or related congestion Took a muscle relaxant and this helped. Has been sleeping in chair due to the pain in his neck.   his blood pressure is elevated today but this may be due to his acute pain syndrome he is instructed to monitor his blood pressure, it remains elevated he will notify our office he denies any chest pain shortness of breath PND orthopnea or other cardiovascular symptoms    Review of Systems  Constitutional: Negative for fever and fatigue.  HENT: Negative for hearing loss, congestion, neck pain and postnasal drip.   Eyes: Negative for discharge, redness and visual disturbance.  Respiratory: Negative for cough, shortness of breath and wheezing.   Cardiovascular: Negative for leg swelling.  Gastrointestinal: Negative for abdominal pain, constipation and abdominal distention.  Genitourinary: Negative for urgency and frequency.  Musculoskeletal: Positive for myalgias, joint swelling and arthralgias.       [ Extreme stiffness in the posterior neck muscles Skin: Negative for color change and rash.  Neurological: Negative for weakness and light-headedness.  Hematological: Negative for adenopathy.  Psychiatric/Behavioral: Negative for behavioral problems.   Past Medical History  Diagnosis Date  . Hypertension   . Osteoarthritis   . Rosacea, acne    Past Surgical History  Procedure Date  . Glaucoma surgery   . Joint replacement     total knee replacement biltateral    reports that he quit smoking about 37 years ago. He does not have any smokeless tobacco history on file. He reports that he drinks about 3.5 ounces of alcohol per week. He  reports that he does not use illicit drugs. family history includes Dementia in his mother and Heart disease in his father.        Objective:   Physical Exam  Constitutional: He is oriented to person, place, and time. He appears well-developed and well-nourished.  HENT:  Head: Normocephalic and atraumatic.  Eyes: Conjunctivae are normal. Pupils are equal, round, and reactive to light.  Neck: Normal range of motion. Neck supple.  Cardiovascular: Normal rate and regular rhythm.   Pulmonary/Chest: Effort normal and breath sounds normal.  Abdominal: Soft. Bowel sounds are normal.  Musculoskeletal: He exhibits tenderness.        Muscle spasms detected in the trapezius muscles bilaterally with limited range of motion due to the spasms  Neurological: He is alert and oriented to person, place, and time. He has normal reflexes.        He is neurologically intact cranial nerves 2-12 and equal grips in his hands  Skin: Skin is warm and dry. No rash noted.  Psychiatric: He has a normal mood and affect. His behavior is normal.          Assessment & Plan:

## 2011-01-29 ENCOUNTER — Telehealth: Payer: Self-pay | Admitting: *Deleted

## 2011-01-29 NOTE — Telephone Encounter (Signed)
Pt calling to request xray results from 2-15

## 2011-01-29 NOTE — Telephone Encounter (Signed)
Pt requesting results of xray °

## 2011-01-29 NOTE — Telephone Encounter (Signed)
called and left message of results instructed patient to call back and leave a message for an MRI if his pain persists would order an MRI of the C-spine

## 2011-05-04 ENCOUNTER — Ambulatory Visit (INDEPENDENT_AMBULATORY_CARE_PROVIDER_SITE_OTHER): Payer: Medicare Other | Admitting: Internal Medicine

## 2011-05-04 ENCOUNTER — Other Ambulatory Visit: Payer: Self-pay | Admitting: *Deleted

## 2011-05-04 ENCOUNTER — Encounter: Payer: Self-pay | Admitting: Internal Medicine

## 2011-05-04 DIAGNOSIS — E785 Hyperlipidemia, unspecified: Secondary | ICD-10-CM

## 2011-05-04 DIAGNOSIS — Z0000 Encounter for general adult medical examination without abnormal findings: Secondary | ICD-10-CM

## 2011-05-04 DIAGNOSIS — M1990 Unspecified osteoarthritis, unspecified site: Secondary | ICD-10-CM

## 2011-05-04 DIAGNOSIS — L719 Rosacea, unspecified: Secondary | ICD-10-CM

## 2011-05-04 DIAGNOSIS — I10 Essential (primary) hypertension: Secondary | ICD-10-CM

## 2011-05-04 LAB — LIPID PANEL
Cholesterol: 184 mg/dL (ref 0–200)
HDL: 57.2 mg/dL (ref >39.00)
LDL Cholesterol: 115 mg/dL — ABNORMAL HIGH (ref 0–99)
Total CHOL/HDL Ratio: 3
Triglycerides: 59 mg/dL (ref 0.0–149.0)
VLDL: 11.8 mg/dL (ref 0.0–40.0)

## 2011-05-04 LAB — CBC WITH DIFFERENTIAL/PLATELET
Basophils Absolute: 0 10*3/uL (ref 0.0–0.1)
Lymphocytes Relative: 36.4 % (ref 12.0–46.0)
Monocytes Relative: 9.7 % (ref 3.0–12.0)
Platelets: 192 10*3/uL (ref 150.0–400.0)
RDW: 14.1 % (ref 11.5–14.6)
WBC: 6.1 10*3/uL (ref 4.5–10.5)

## 2011-05-04 LAB — BASIC METABOLIC PANEL
BUN: 10 mg/dL (ref 6–23)
CO2: 28 mEq/L (ref 19–32)
Calcium: 9.2 mg/dL (ref 8.4–10.5)
Chloride: 100 mEq/L (ref 96–112)
Creatinine, Ser: 0.8 mg/dL (ref 0.4–1.5)
GFR: 100.22 mL/min (ref >60.00)
Glucose, Bld: 101 mg/dL — ABNORMAL HIGH (ref 70–99)
Potassium: 4.1 mEq/L (ref 3.5–5.1)
Sodium: 136 mEq/L (ref 135–145)

## 2011-05-04 LAB — POCT URINALYSIS DIPSTICK
Bilirubin, UA: NEGATIVE
Blood, UA: NEGATIVE
Glucose, UA: NEGATIVE
Ketones, UA: NEGATIVE
Leukocytes, UA: NEGATIVE
Nitrite, UA: NEGATIVE
Protein, UA: NEGATIVE
Spec Grav, UA: 1.01
Urobilinogen, UA: 0.2
pH, UA: 7

## 2011-05-04 MED ORDER — METRONIDAZOLE 1 % EX GEL: Freq: Every day | CUTANEOUS | Status: DC

## 2011-05-04 NOTE — Assessment & Plan Note (Signed)
Blood pressure stable measure BUN and creat

## 2011-05-04 NOTE — Progress Notes (Signed)
Subjective:    Mitchell Walker is a 77 y.o. male who presents for Medicare Annual/Subsequent preventive examination.  In addition to his Medicare examination he was monitored for his rosacea.  Refill of medication requested for his hypertension and renal function will be monitored and for his hyperlipidemia a lipid panel will be monitored and compliance with medications confirmed.  He has generally no additional complaints today other than arthritis in his hand (related to fishing)  Preventive Screening-Counseling & Management  Tobacco History  Smoking status  . Former Smoker  . Quit date: 01/27/1974  Smokeless tobacco  . Not on file  Comment: quit smoking 40 years ago    Problems Prior to Visit 1.   Current Problems (verified) Patient Active Problem List  Diagnoses  . CARCINOMA, BASAL CELL, SKIN  . HYPERLIPIDEMIA, ATHEROGENIC  . ERECTILE DYSFUNCTION  . HYPERTENSION  . EXTERNAL HEMORRHOIDS, THROMBOSED  . ACNE ROSACEA  . ACTINIC KERATOSIS  . OSTEOARTHRITIS  . ADHESIVE CAPSULITIS OF SHOULDER  . UNSPEC DISORDERS BURSAE&TENDONS SHOULDER REGION  . ROTATOR CUFF TEAR  . CHRONIC FRONTAL SINUSITIS  . MUSCULOSKELETAL PAIN  . SKIN RASH  . Cervical radiculopathy, acute    Medications Prior to Visit Current Outpatient Prescriptions on File Prior to Visit  Medication Sig Dispense Refill  . amLODipine (NORVASC) 10 MG tablet Take 10 mg by mouth daily.        . benazepril-hydrochlorthiazide (LOTENSIN HCT) 20-25 MG per tablet Take 1 tablet by mouth daily.        . fish oil-omega-3 fatty acids 1000 MG capsule Take 1 g by mouth 2 (two) times daily.        . metroNIDAZOLE (METROGEL) 1 % gel Apply topically 2 (two) times daily. Apply to face as directed         Current Medications (verified) Current Outpatient Prescriptions  Medication Sig Dispense Refill  . amLODipine (NORVASC) 10 MG tablet Take 10 mg by mouth daily.        . benazepril-hydrochlorthiazide (LOTENSIN HCT) 20-25 MG  per tablet Take 1 tablet by mouth daily.        . fish oil-omega-3 fatty acids 1000 MG capsule Take 1 g by mouth 2 (two) times daily.        . metroNIDAZOLE (METROGEL) 1 % gel Apply topically 2 (two) times daily. Apply to face as directed          Allergies (verified) Celecoxib and Sulfonamide derivatives   PAST HISTORY  Family History Family History  Problem Relation Age of Onset  . Dementia Mother   . Heart disease Father     Social History History  Substance Use Topics  . Smoking status: Former Smoker    Quit date: 01/27/1974  . Smokeless tobacco: Not on file   Comment: quit smoking 40 years ago  . Alcohol Use: 3.5 oz/week    7 drink(s) per week    Are there smokers in your home (other than you)?  No  Risk Factors Current exercise habits: Home exercise routine includes riding bycycle 20-30 miles a week.  Dietary issues discussed: low salt diet  Cardiac risk factors: advanced age (older than 18 for men, 50 for women), hypertension and male gender.  Depression Screen (Note: if answer to either of the following is "Yes", a more complete depression screening is indicated)   Q1: Over the past two weeks, have you felt down, depressed or hopeless? No  Q2: Over the past two weeks, have you felt little interest or  pleasure in doing things? No  Have you lost interest or pleasure in daily life? No  Do you often feel hopeless? No  Do you cry easily over simple problems? No  Activities of Daily Living In your present state of health, do you have any difficulty performing the following activities?:  Driving? No Managing money?  no Feeding yourself? No Getting from bed to chair? No Climbing a flight of stairs? No Preparing food and eating?: No Bathing or showering? No Getting dressed: No Getting to the toilet? No Using the toilet:No Moving around from place to place: No In the past year have you fallen or had a near fall?:No   Are you sexually active?  Yes  Do you have  more than one partner?  No  Hearing Difficulties: No Do you often ask people to speak up or repeat themselves? No Do you experience ringing or noises in your ears? No Do you have difficulty understanding soft or whispered voices? No   Do you feel that you have a problem with memory? No  Do you often misplace items? No  Do you feel safe at home?  Yes  Cognitive Testing  Alert? Yes  Normal Appearance?Yes  Oriented to person? Yes  Place? Yes   Time? Yes  Recall of three objects?  Yes  Can perform simple calculations? Yes  Displays appropriate judgment?Yes  Can read the correct time from a watch face?Yes   Advanced Directives have been discussed with the patient? Yes   List the Names of Other Physician/Practitioners you currently use: 1.    Indicate any recent Medical Services you may have received from other than [facility] providers in the past year (date may be approximate).  Immunization History  Administered Date(s) Administered  . Influenza Whole 09/01/2010  . Pneumococcal Polysaccharide 12/13/2004, 02/11/2010  . Td 12/14/2003  . Zoster 03/04/2010    Screening Tests Health Maintenance  Topic Date Due  . Influenza Vaccine  09/13/2011  . Tetanus/tdap  12/13/2013  . Colonoscopy  02/25/2020  . Pneumococcal Polysaccharide Vaccine Age 35 And Over  Completed  . Zostavax  Completed    All answers were reviewed with the patient and necessary referrals were made:  Carrie Mew   05/04/2011   History reviewed: allergies, current medications, past family history, past medical history, past social history, past surgical history and problem list  Review of Systems A comprehensive review of systems was negative.    Objective:     Vision by Snellen chart: right eye:20/20, left eye:20/20 Blood pressure 140/78, pulse 76, temperature 98.4 F (36.9 C), resp. rate 16, height 5\' 8"  (1.727 m), weight 186 lb (84.369 kg). Body mass index is 28.28 kg/(m^2).  BP 140/78  Pulse 76   Temp 98.4 F (36.9 C)  Resp 16  Ht 5\' 8"  (1.727 m)  Wt 186 lb (84.369 kg)  BMI 28.28 kg/m2  General Appearance:    Alert, cooperative, no distress, appears stated age  Head:    Normocephalic, without obvious abnormality, atraumatic  Eyes:    PERRL, conjunctiva/corneas clear, EOM's intact, fundi    benign, both eyes       Ears:    Normal TM's and external ear canals, both ears  Nose:   Nares normal, septum midline, mucosa normal, no drainage    or sinus tenderness  Throat:   Lips, mucosa, and tongue normal; teeth and gums normal  Neck:   Supple, symmetrical, trachea midline, no adenopathy;       thyroid:  No enlargement/tenderness/nodules; no carotid   bruit or JVD  Back:     Symmetric, no curvature, ROM normal, no CVA tenderness  Lungs:     Clear to auscultation bilaterally, respirations unlabored  Chest wall:    No tenderness or deformity  Heart:    Regular rate and rhythm, S1 and S2 normal, no murmur, rub   or gallop  Abdomen:     Soft, non-tender, bowel sounds active all four quadrants,    no masses, no organomegaly  Genitalia:    Normal male without lesion, discharge or tenderness  Rectal:    Normal tone, normal prostate, no masses or tenderness;   guaiac negative stool  Extremities:   Extremities normal, atraumatic, no cyanosis or edema  Pulses:   2+ and symmetric all extremities  Skin:   Skin color, texture, turgor normal, no rashes or lesions  Lymph nodes:   Cervical, supraclavicular, and axillary nodes normal  Neurologic:   CNII-XII intact. Normal strength, sensation and reflexes      throughout       Assessment:     Patient presents for yearly preventative medicine examination.   all immunizations and health maintenance protocols were reviewed with the patient and they are up to date with these protocols.   screening laboratory values were reviewed with the patient including screening of hyperlipidemia PSA renal function and hepatic function.   There medications  past medical history social history problem list and allergies were reviewed in detail.   Goals were established with regard to weight loss exercise diet in compliance with medications      Plan:     During the course of the visit the patient was educated and counseled about appropriate screening and preventive services including:    Prostate cancer screening  Advanced directives: has an advanced directive - a copy has been provided  Diet review for nutrition referral? Yes ____  Not Indicated _x___   Patient Instructions (the written plan) was given to the patient.  Medicare Attestation I have personally reviewed: The patient's medical and social history Their use of alcohol, tobacco or illicit drugs Their current medications and supplements The patient's functional ability including ADLs,fall risks, home safety risks, cognitive, and hearing and visual impairment Diet and physical activities Evidence for depression or mood disorders  The patient's weight, height, BMI, and visual acuity have been recorded in the chart.  I have made referrals, counseling, and provided education to the patient based on review of the above and I have provided the patient with a written personalized care plan for preventive services.     Carrie Mew   05/04/2011

## 2011-05-04 NOTE — Assessment & Plan Note (Signed)
Lipid panel will be ordered today Patient continues his visual is his primary intervention for his hyperlipidemia

## 2011-11-08 ENCOUNTER — Ambulatory Visit: Payer: Medicare Other | Admitting: Internal Medicine

## 2011-11-10 ENCOUNTER — Ambulatory Visit: Payer: Medicare Other | Admitting: Internal Medicine

## 2011-11-11 ENCOUNTER — Encounter: Payer: Self-pay | Admitting: Internal Medicine

## 2011-11-11 ENCOUNTER — Ambulatory Visit (INDEPENDENT_AMBULATORY_CARE_PROVIDER_SITE_OTHER): Payer: Medicare Other | Admitting: Internal Medicine

## 2011-11-11 VITALS — BP 146/86 | HR 76 | Temp 98.7°F | Resp 16 | Ht 68.0 in | Wt 191.0 lb

## 2011-11-11 DIAGNOSIS — M7500 Adhesive capsulitis of unspecified shoulder: Secondary | ICD-10-CM

## 2011-11-11 DIAGNOSIS — M1990 Unspecified osteoarthritis, unspecified site: Secondary | ICD-10-CM

## 2011-11-11 DIAGNOSIS — M542 Cervicalgia: Secondary | ICD-10-CM

## 2011-11-11 DIAGNOSIS — R29818 Other symptoms and signs involving the nervous system: Secondary | ICD-10-CM

## 2011-11-11 MED ORDER — METHYLPREDNISOLONE (PAK) 4 MG PO TABS: 4.0000 mg | ORAL_TABLET | Freq: Every day | ORAL | Status: DC

## 2011-11-11 MED ORDER — CYCLOBENZAPRINE HCL 10 MG PO TABS: 10.0000 mg | ORAL_TABLET | Freq: Three times a day (TID) | ORAL | Status: DC | PRN

## 2011-11-11 NOTE — Progress Notes (Signed)
Subjective:    Patient ID: Mitchell Walker, male    DOB: [birth year], 77 y.o.   MRN: [id number]  HPI Sore in neck and shoulder bilaterally Head ache yesterday Blood pressure increased secondary to pain ROM limited due to pain    Review of Systems  Constitutional: Negative for fever and fatigue.  HENT: Negative for hearing loss, congestion, neck pain and postnasal drip.   Eyes: Negative for discharge, redness and visual disturbance.  Respiratory: Negative for cough, shortness of breath and wheezing.   Cardiovascular: Negative for leg swelling.  Gastrointestinal: Negative for abdominal pain, constipation and abdominal distention.  Genitourinary: Negative for urgency and frequency.  Musculoskeletal: Positive for myalgias, joint swelling and arthralgias.  Skin: Negative for color change and rash.  Neurological: Negative for weakness and light-headedness.  Hematological: Negative for adenopathy.  Psychiatric/Behavioral: Negative for behavioral problems.   Past Medical History  Diagnosis Date  . Hypertension   . Osteoarthritis   . Rosacea, acne     History   Social History  . Marital Status: Married    Spouse Name: N/A    Number of Children: N/A  . Years of Education: N/A   Occupational History  . Not on file.   Social History Main Topics  . Smoking status: Former Smoker    Quit date: 01/27/1974  . Smokeless tobacco: Not on file   Comment: quit smoking 40 years ago  . Alcohol Use: 3.5 oz/week    7 drink(s) per week  . Drug Use: No  . Sexually Active: Yes   Other Topics Concern  . Not on file   Social History Narrative   [employer]ated [employer] Release signed on 04/29/10    Past Surgical History  Procedure Date  . Glaucoma surgery   . Joint replacement     total knee replacement biltateral    Family History  Problem Relation Age of Onset  . Dementia Mother   . Heart disease Father     Allergies  Allergen Reactions  . Celecoxib     REACTION: Upset GI  .  Sulfonamide Derivatives     REACTION: rash    Current Outpatient Prescriptions on File Prior to Visit  Medication Sig Dispense Refill  . amLODipine (NORVASC) 10 MG tablet Take 10 mg by mouth daily.        . benazepril-hydrochlorthiazide (LOTENSIN HCT) 20-25 MG per tablet Take 1 tablet by mouth daily.        . fish oil-omega-3 fatty acids 1000 MG capsule Take 1 g by mouth 2 (two) times daily.          BP 146/86  Pulse 76  Temp 98.7 F (37.1 C)  Resp 16  Ht 5\' 8"  (1.727 m)  Wt 191 lb (86.637 kg)  BMI 29.04 kg/m2       Objective:   Physical Exam  Nursing note and vitals reviewed. Constitutional: He appears well-developed and well-nourished.  HENT:  Head: Normocephalic and atraumatic.  Eyes: Conjunctivae are normal. Pupils are equal, round, and reactive to light.  Neck: Normal range of motion. Neck supple.  Cardiovascular: Normal rate and regular rhythm.   Pulmonary/Chest: Effort normal and breath sounds normal.  Abdominal: Soft. Bowel sounds are normal.  Musculoskeletal: He exhibits edema and tenderness.          Assessment & Plan:  The differential diagnosis is spondylosis of the neck with cervical sprain syndrome versus a viral syndrome such as coxsackie B. I believe we should treat him with a muscle  relaxants additional course of the corticosteroids to see if we can relieve the neck pressure. If fever persists or if the pressure continues than a x-ray study would be ordered and consideration for referral

## 2011-11-11 NOTE — Patient Instructions (Signed)
The patient is instructed to continue all medications as prescribed. Schedule followup with check out clerk upon leaving the clinic  

## 2011-11-18 ENCOUNTER — Ambulatory Visit (INDEPENDENT_AMBULATORY_CARE_PROVIDER_SITE_OTHER): Payer: Medicare Other | Admitting: Internal Medicine

## 2011-11-18 DIAGNOSIS — Z23 Encounter for immunization: Secondary | ICD-10-CM

## 2011-12-17 DIAGNOSIS — L719 Rosacea, unspecified: Secondary | ICD-10-CM | POA: Diagnosis not present

## 2012-01-17 DIAGNOSIS — H04229 Epiphora due to insufficient drainage, unspecified lacrimal gland: Secondary | ICD-10-CM | POA: Diagnosis not present

## 2012-01-17 DIAGNOSIS — H02059 Trichiasis without entropian unspecified eye, unspecified eyelid: Secondary | ICD-10-CM | POA: Diagnosis not present

## 2012-02-21 ENCOUNTER — Other Ambulatory Visit: Payer: Self-pay | Admitting: Internal Medicine

## 2012-05-10 ENCOUNTER — Ambulatory Visit (INDEPENDENT_AMBULATORY_CARE_PROVIDER_SITE_OTHER): Payer: Medicare Other | Admitting: Internal Medicine

## 2012-05-10 ENCOUNTER — Encounter: Payer: Self-pay | Admitting: Internal Medicine

## 2012-05-10 VITALS — BP 142/80 | HR 60 | Temp 98.2°F | Resp 16 | Ht 68.0 in | Wt 186.0 lb

## 2012-05-10 DIAGNOSIS — M1990 Unspecified osteoarthritis, unspecified site: Secondary | ICD-10-CM

## 2012-05-10 DIAGNOSIS — Z0000 Encounter for general adult medical examination without abnormal findings: Secondary | ICD-10-CM

## 2012-05-10 DIAGNOSIS — F528 Other sexual dysfunction not due to a substance or known physiological condition: Secondary | ICD-10-CM

## 2012-05-10 DIAGNOSIS — I10 Essential (primary) hypertension: Secondary | ICD-10-CM

## 2012-05-10 DIAGNOSIS — T887XXA Unspecified adverse effect of drug or medicament, initial encounter: Secondary | ICD-10-CM | POA: Diagnosis not present

## 2012-05-10 DIAGNOSIS — N401 Enlarged prostate with lower urinary tract symptoms: Secondary | ICD-10-CM

## 2012-05-10 DIAGNOSIS — N139 Obstructive and reflux uropathy, unspecified: Secondary | ICD-10-CM | POA: Diagnosis not present

## 2012-05-10 DIAGNOSIS — E785 Hyperlipidemia, unspecified: Secondary | ICD-10-CM | POA: Diagnosis not present

## 2012-05-10 DIAGNOSIS — N138 Other obstructive and reflux uropathy: Secondary | ICD-10-CM

## 2012-05-10 LAB — CBC WITH DIFFERENTIAL/PLATELET
Basophils Absolute: 0 10*3/uL (ref 0.0–0.1)
Eosinophils Absolute: 0.2 10*3/uL (ref 0.0–0.7)
Lymphocytes Relative: 41.9 % (ref 12.0–46.0)
MCHC: 33.3 g/dL (ref 30.0–36.0)
Monocytes Relative: 11.5 % (ref 3.0–12.0)
Neutro Abs: 2.4 10*3/uL (ref 1.4–7.7)
Neutrophils Relative %: 41.8 % — ABNORMAL LOW (ref 43.0–77.0)
Platelets: 222 10*3/uL (ref 150.0–400.0)
RDW: 13.6 % (ref 11.5–14.6)

## 2012-05-10 LAB — HEPATIC FUNCTION PANEL
AST: 24 U/L (ref 0–37)
Albumin: 4.1 g/dL (ref 3.5–5.2)
Alkaline Phosphatase: 68 U/L (ref 39–117)
Bilirubin, Direct: 0.2 mg/dL (ref 0.0–0.3)

## 2012-05-10 LAB — BASIC METABOLIC PANEL
Calcium: 9.2 mg/dL (ref 8.4–10.5)
GFR: 98.52 mL/min (ref >60.00)
Glucose, Bld: 106 mg/dL — ABNORMAL HIGH (ref 70–99)
Potassium: 4 mEq/L (ref 3.5–5.1)
Sodium: 135 mEq/L (ref 135–145)

## 2012-05-10 LAB — LIPID PANEL
HDL: 50.6 mg/dL (ref >39.00)
LDL Cholesterol: 126 mg/dL — ABNORMAL HIGH (ref 0–99)
Total CHOL/HDL Ratio: 4
VLDL: 13.6 mg/dL (ref 0.0–40.0)

## 2012-05-10 LAB — POCT URINALYSIS DIPSTICK
Bilirubin, UA: NEGATIVE
Blood, UA: NEGATIVE
Glucose, UA: NEGATIVE
Leukocytes, UA: NEGATIVE
Nitrite, UA: NEGATIVE
Urobilinogen, UA: 0.2

## 2012-05-10 LAB — TSH: TSH: 1.66 u[IU]/mL (ref 0.35–5.50)

## 2012-05-10 LAB — PSA: PSA: 4.04 ng/mL — ABNORMAL HIGH (ref 0.10–4.00)

## 2012-05-10 NOTE — Patient Instructions (Signed)
The patient is instructed to continue all medications as prescribed. Schedule followup with check out clerk upon leaving the clinic  

## 2012-05-10 NOTE — Progress Notes (Signed)
Subjective:    Patient ID: Mitchell Walker, male    DOB: September 08, 1934, 78 y.o.   MRN: [id number]  HPI  Patient presents for Medicare wellness examination and also for chronic followup of multiple medical problems including atherogenic hyperlipidemia erectile dysfunction and hypertension.  We will proceed with screening blood work today for his lipids as well as a liver panel to monitor for possible side effects of his medications.  We will look at his renal function and his potassium on antihypertensive medications his blood pressure is moderately elevated today at 142/80 but he did not take his medication today so that he could be fasting for his blood work.  His weight is stable and he states that his diet is relatively free from additional salt.  Review of Systems  Constitutional: Negative for fever and fatigue.  HENT: Negative for hearing loss, congestion, neck pain and postnasal drip.   Eyes: Negative for discharge, redness and visual disturbance.  Respiratory: Negative for cough, shortness of breath and wheezing.   Cardiovascular: Negative for leg swelling.  Gastrointestinal: Negative for abdominal pain, constipation and abdominal distention.  Genitourinary: Negative for urgency and frequency.  Musculoskeletal: Negative for joint swelling and arthralgias.  Skin: Negative for color change and rash.  Neurological: Negative for weakness and light-headedness.  Hematological: Negative for adenopathy.  Psychiatric/Behavioral: Negative for behavioral problems.       Past Medical History  Diagnosis Date  . Hypertension   . Osteoarthritis   . Rosacea, acne     History   Social History  . Marital Status: Married    Spouse Name: N/A    Number of Children: N/A  . Years of Education: N/A   Occupational History  . Not on file.   Social History Main Topics  . Smoking status: Former Smoker    Quit date: 01/27/1974  . Smokeless tobacco: Not on file   Comment: quit smoking 40 years  ago  . Alcohol Use: 3.5 oz/week    7 drink(s) per week  . Drug Use: No  . Sexually Active: Yes   Other Topics Concern  . Not on file   Social History Narrative   [employer] Release signed on 04/29/10    Past Surgical History  Procedure Date  . Glaucoma surgery   . Joint replacement     total knee replacement biltateral    Family History  Problem Relation Age of Onset  . Dementia Mother   . Heart disease Father     Allergies  Allergen Reactions  . Celecoxib     REACTION: Upset GI  . Sulfonamide Derivatives     REACTION: rash    Current Outpatient Prescriptions on File Prior to Visit  Medication Sig Dispense Refill  . amLODipine (NORVASC) 10 MG tablet TAKE ONE TABLET BY MOUTH EVERY DAY  90 tablet  3  . benazepril-hydrochlorthiazide (LOTENSIN HCT) 20-25 MG per tablet TAKE ONE TABLET BY MOUTH EVERY DAY  90 tablet  3  . cyclobenzaprine (FLEXERIL) 10 MG tablet Take 1 tablet (10 mg total) by mouth every 8 (eight) hours as needed for muscle spasms.  30 tablet  1  . doxycycline (DORYX) 100 MG DR capsule Take 100 mg by mouth 2 (two) times daily as needed.       . fish oil-omega-3 fatty acids 1000 MG capsule Take 1 g by mouth 2 (two) times daily.          BP 142/80  Pulse 60  Temp 98.2 F (36.8 C)  Resp 16  Ht 5\' 8"  (1.727 m)  Wt 186 lb (84.369 kg)  BMI 28.28 kg/m2    Objective:   Physical Exam  Constitutional: He appears well-developed and well-nourished.  HENT:  Head: Normocephalic and atraumatic.  Eyes: Conjunctivae are normal. Pupils are equal, round, and reactive to light.  Neck: Normal range of motion. Neck supple.  Cardiovascular: Normal rate and regular rhythm.   Pulmonary/Chest: Effort normal and breath sounds normal.  Abdominal: Soft. Bowel sounds are normal.          Assessment & Plan:  Monitoring a basic metabolic panel for hypertensive medication side effect monitoring a liver profile for anti-lipid medication side effects and looking at a  lipid panel to assess HDL total cholesterol and LDL for control. Stable OA Stable blood pressure   Patient stable his current medications in Medicare wellness examination was performed today Subjective:    Mitchell Walker is a 78 y.o. male who presents for Medicare Annual/Subsequent preventive examination.   Preventive Screening-Counseling & Management  Tobacco History  Smoking status  . Former Smoker  . Quit date: 01/27/1974  Smokeless tobacco  . Not on file  Comment: quit smoking 40 years ago    Problems Prior to Visit 1.   Current Problems (verified) Patient Active Problem List  Diagnoses  . CARCINOMA, BASAL CELL, SKIN  . HYPERLIPIDEMIA, ATHEROGENIC  . ERECTILE DYSFUNCTION  . HYPERTENSION  . EXTERNAL HEMORRHOIDS, THROMBOSED  . ACNE ROSACEA  . ACTINIC KERATOSIS  . OSTEOARTHRITIS  . ADHESIVE CAPSULITIS OF SHOULDER  . UNSPEC DISORDERS BURSAE&TENDONS SHOULDER REGION  . ROTATOR CUFF TEAR  . CHRONIC FRONTAL SINUSITIS  . MUSCULOSKELETAL PAIN  . Cervical radiculopathy, acute    Medications Prior to Visit Current Outpatient Prescriptions on File Prior to Visit  Medication Sig Dispense Refill  . amLODipine (NORVASC) 10 MG tablet TAKE ONE TABLET BY MOUTH EVERY DAY  90 tablet  3  . benazepril-hydrochlorthiazide (LOTENSIN HCT) 20-25 MG per tablet TAKE ONE TABLET BY MOUTH EVERY DAY  90 tablet  3  . cyclobenzaprine (FLEXERIL) 10 MG tablet Take 1 tablet (10 mg total) by mouth every 8 (eight) hours as needed for muscle spasms.  30 tablet  1  . doxycycline (DORYX) 100 MG DR capsule Take 100 mg by mouth 2 (two) times daily as needed.       . fish oil-omega-3 fatty acids 1000 MG capsule Take 1 g by mouth 2 (two) times daily.          Current Medications (verified) Current Outpatient Prescriptions  Medication Sig Dispense Refill  . amLODipine (NORVASC) 10 MG tablet TAKE ONE TABLET BY MOUTH EVERY DAY  90 tablet  3  . benazepril-hydrochlorthiazide (LOTENSIN HCT) 20-25 MG per tablet  TAKE ONE TABLET BY MOUTH EVERY DAY  90 tablet  3  . cyclobenzaprine (FLEXERIL) 10 MG tablet Take 1 tablet (10 mg total) by mouth every 8 (eight) hours as needed for muscle spasms.  30 tablet  1  . doxycycline (DORYX) 100 MG DR capsule Take 100 mg by mouth 2 (two) times daily as needed.       . fish oil-omega-3 fatty acids 1000 MG capsule Take 1 g by mouth 2 (two) times daily.           Allergies (verified) Celecoxib and Sulfonamide derivatives   PAST HISTORY  Family History Family History  Problem Relation Age of Onset  . Dementia Mother   . Heart disease Father     Social History  History  Substance Use Topics  . Smoking status: Former Smoker    Quit date: 01/27/1974  . Smokeless tobacco: Not on file   Comment: quit smoking 40 years ago  . Alcohol Use: 3.5 oz/week    7 drink(s) per week    Are there smokers in your home (other than you)?  No  Risk Factors Current exercise habits: Home exercise routine includes stretching and treadmill.  Dietary issues discussed: weight control, salt restrictions   Cardiac risk factors: advanced age (older than 110 for men, 71 for women), dyslipidemia, hypertension and male gender.  Depression Screen (Note: if answer to either of the following is "Yes", a more complete depression screening is indicated)   Q1: Over the past two weeks, have you felt down, depressed or hopeless? No  Q2: Over the past two weeks, have you felt little interest or pleasure in doing things? No  Have you lost interest or pleasure in daily life? No  Do you often feel hopeless? No  Do you cry easily over simple problems? No  Activities of Daily Living In your present state of health, do you have any difficulty performing the following activities?:  Driving? No Managing money?  No Feeding yourself? No Getting from bed to chair? No Climbing a flight of stairs? No Preparing food and eating?: No Bathing or showering? No Getting dressed: No Getting to the toilet?  No Using the toilet:No Moving around from place to place: No In the past year have you fallen or had a near fall?:No   Are you sexually active?  Yes  Do you have more than one partner?  No  Hearing Difficulties: No Do you often ask people to speak up or repeat themselves? No Do you experience ringing or noises in your ears? No Do you have difficulty understanding soft or whispered voices? No   Do you feel that you have a problem with memory? No  Do you often misplace items? No  Do you feel safe at home?  No  Cognitive Testing  Alert? Yes  Normal Appearance?Yes  Oriented to person? No  Place? Yes   Time? Yes  Recall of three objects?  Yes  Can perform simple calculations? Yes  Displays appropriate judgment?Yes  Can read the correct time from a watch face?Yes   Advanced Directives have been discussed with the patient? Yes   List the Names of Other Physician/Practitioners you currently use: 1.    Indicate any recent Medical Services you may have received from other than [facility] providers in the past year (date may be approximate).  Immunization History  Administered Date(s) Administered  . Influenza Split 11/18/2011  . Influenza Whole 09/01/2010  . Pneumococcal Polysaccharide 12/13/2004, 02/11/2010  . Td 12/14/2003  . Zoster 03/04/2010    Screening Tests Health Maintenance  Topic Date Due  . Influenza Vaccine  09/12/2012  . Tetanus/tdap  12/13/2013  . Colonoscopy  02/25/2020  . Pneumococcal Polysaccharide Vaccine Age 70 And Over  Completed  . Zostavax  Completed    All answers were reviewed with the patient and necessary referrals were made:  Carrie Mew, MD   05/10/2012   History reviewed: allergies, current medications, past family history, past medical history, past social history, past surgical history and problem list  Review of Systems A comprehensive review of systems was negative.    Objective:     Vision by Snellen chart: right eye:20/20, left  eye:20/20 Blood pressure 142/80, pulse 60, temperature 98.2 F (36.8 C), resp. rate  16, height 5\' 8"  (1.727 m), weight 186 lb (84.369 kg). Body mass index is 28.28 kg/(m^2).  BP 142/80  Pulse 60  Temp 98.2 F (36.8 C)  Resp 16  Ht 5\' 8"  (1.727 m)  Wt 186 lb (84.369 kg)  BMI 28.28 kg/m2  General Appearance:    Alert, cooperative, no distress, appears stated age  Head:    Normocephalic, without obvious abnormality, atraumatic  Eyes:    PERRL, conjunctiva/corneas clear, EOM's intact, fundi    benign, both eyes       Ears:    Normal TM's and external ear canals, both ears  Nose:   Nares normal, septum midline, mucosa normal, no drainage    or sinus tenderness  Throat:   Lips, mucosa, and tongue normal; teeth and gums normal  Neck:   Supple, symmetrical, trachea midline, no adenopathy;       thyroid:  No enlargement/tenderness/nodules; no carotid   bruit or JVD  Back:     Symmetric, no curvature, ROM normal, no CVA tenderness  Lungs:     Clear to auscultation bilaterally, respirations unlabored  Chest wall:    No tenderness or deformity  Heart:    Regular rate and rhythm, S1 and S2 normal, no murmur, rub   or gallop  Abdomen:     Soft, non-tender, bowel sounds active all four quadrants,    no masses, no organomegaly  Genitalia:    Normal male without lesion, discharge or tenderness  Rectal:    Normal tone, normal prostate, no masses or tenderness;   guaiac negative stool  Extremities:   Extremities normal, atraumatic, no cyanosis or edema  Pulses:   2+ and symmetric all extremities  Skin:   Skin color, texture, turgor normal, no rashes or lesions  Lymph nodes:   Cervical, supraclavicular, and axillary nodes normal  Neurologic:   CNII-XII intact. Normal strength, sensation and reflexes      throughout       Assessment:      Patient presents for yearly preventative medicine examination.   all immunizations and health maintenance protocols were reviewed with the patient and  they are up to date with these protocols.   screening laboratory values were reviewed with the patient including screening of hyperlipidemia PSA renal function and hepatic function.   There medications past medical history social history problem list and allergies were reviewed in detail.   Goals were established with regard to weight loss exercise diet in compliance with medications      Plan:     During the course of the visit the patient was educated and counseled about appropriate screening and preventive services including:    Influenza vaccine  Prostate cancer screening  Diabetes screening  Diet review for nutrition referral? Yes ____  Not Indicated ____   Patient Instructions (the written plan) was given to the patient.  Medicare Attestation I have personally reviewed: The patient's medical and social history Their use of alcohol, tobacco or illicit drugs Their current medications and supplements The patient's functional ability including ADLs,fall risks, home safety risks, cognitive, and hearing and visual impairment Diet and physical activities Evidence for depression or mood disorders  The patient's weight, height, BMI, and visual acuity have been recorded in the chart.  I have made referrals, counseling, and provided education to the patient based on review of the above and I have provided the patient with a written personalized care plan for preventive services.     Carrie Mew, MD  05/10/2012        

## 2012-05-22 DIAGNOSIS — H02059 Trichiasis without entropian unspecified eye, unspecified eyelid: Secondary | ICD-10-CM | POA: Diagnosis not present

## 2012-05-22 DIAGNOSIS — H04229 Epiphora due to insufficient drainage, unspecified lacrimal gland: Secondary | ICD-10-CM | POA: Diagnosis not present

## 2012-05-23 DIAGNOSIS — L821 Other seborrheic keratosis: Secondary | ICD-10-CM | POA: Diagnosis not present

## 2012-05-23 DIAGNOSIS — L719 Rosacea, unspecified: Secondary | ICD-10-CM | POA: Diagnosis not present

## 2012-05-23 DIAGNOSIS — L578 Other skin changes due to chronic exposure to nonionizing radiation: Secondary | ICD-10-CM | POA: Diagnosis not present

## 2012-05-23 DIAGNOSIS — L570 Actinic keratosis: Secondary | ICD-10-CM | POA: Diagnosis not present

## 2012-10-03 ENCOUNTER — Emergency Department (HOSPITAL_COMMUNITY)
Admission: EM | Admit: 2012-10-03 | Discharge: 2012-10-03 | Disposition: A | Discharge status: Home / Self Care | Payer: Medicare Other | Attending: Emergency Medicine | Admitting: Emergency Medicine

## 2012-10-03 ENCOUNTER — Encounter (HOSPITAL_COMMUNITY): Payer: Self-pay | Admitting: Cardiology

## 2012-10-03 DIAGNOSIS — B338 Other specified viral diseases: Secondary | ICD-10-CM | POA: Diagnosis not present

## 2012-10-03 DIAGNOSIS — M1990 Unspecified osteoarthritis, unspecified site: Secondary | ICD-10-CM | POA: Diagnosis not present

## 2012-10-03 DIAGNOSIS — S4980XA Other specified injuries of shoulder and upper arm, unspecified arm, initial encounter: Secondary | ICD-10-CM | POA: Diagnosis not present

## 2012-10-03 DIAGNOSIS — S46909A Unspecified injury of unspecified muscle, fascia and tendon at shoulder and upper arm level, unspecified arm, initial encounter: Secondary | ICD-10-CM | POA: Insufficient documentation

## 2012-10-03 DIAGNOSIS — Z96659 Presence of unspecified artificial knee joint: Secondary | ICD-10-CM | POA: Diagnosis not present

## 2012-10-03 DIAGNOSIS — Z87891 Personal history of nicotine dependence: Secondary | ICD-10-CM | POA: Insufficient documentation

## 2012-10-03 DIAGNOSIS — B9789 Other viral agents as the cause of diseases classified elsewhere: Secondary | ICD-10-CM | POA: Diagnosis not present

## 2012-10-03 DIAGNOSIS — X500XXA Overexertion from strenuous movement or load, initial encounter: Secondary | ICD-10-CM | POA: Insufficient documentation

## 2012-10-03 DIAGNOSIS — I10 Essential (primary) hypertension: Secondary | ICD-10-CM | POA: Diagnosis not present

## 2012-10-03 DIAGNOSIS — Y9389 Activity, other specified: Secondary | ICD-10-CM | POA: Insufficient documentation

## 2012-10-03 DIAGNOSIS — Y929 Unspecified place or not applicable: Secondary | ICD-10-CM | POA: Insufficient documentation

## 2012-10-03 DIAGNOSIS — B349 Viral infection, unspecified: Secondary | ICD-10-CM

## 2012-10-03 LAB — CBC
HCT: 35.4 % — ABNORMAL LOW (ref 39.0–52.0)
Hemoglobin: 12.4 g/dL — ABNORMAL LOW (ref 13.0–17.0)
MCV: 84.5 fL (ref 78.0–100.0)
RBC: 4.19 MIL/uL — ABNORMAL LOW (ref 4.22–5.81)
RDW: 13.4 % (ref 11.5–15.5)
WBC: 9.2 10*3/uL (ref 4.0–10.5)

## 2012-10-03 LAB — URINALYSIS, ROUTINE W REFLEX MICROSCOPIC
Bilirubin Urine: NEGATIVE
Glucose, UA: NEGATIVE mg/dL
Hgb urine dipstick: NEGATIVE
Specific Gravity, Urine: 1.013 (ref 1.005–1.030)
pH: 7 (ref 5.0–8.0)

## 2012-10-03 LAB — POCT I-STAT, CHEM 8
BUN: 9 mg/dL (ref 6–23)
Chloride: 101 mEq/L (ref 96–112)
Creatinine, Ser: 1.1 mg/dL (ref 0.50–1.35)
Glucose, Bld: 119 mg/dL — ABNORMAL HIGH (ref 70–99)
Potassium: 3.4 mEq/L — ABNORMAL LOW (ref 3.5–5.1)
Sodium: 139 mEq/L (ref 135–145)

## 2012-10-03 MED ORDER — KETOROLAC TROMETHAMINE 30 MG/ML IJ SOLN
30.0000 mg | Freq: Once | INTRAMUSCULAR | Status: AC
  Administered 2012-10-03: 30 mg via INTRAMUSCULAR
  Filled 2012-10-03: qty 1

## 2012-10-03 NOTE — ED Notes (Signed)
Pt reports he hit his head on the head board a couple of days ago, went fishing on Saturday and is now having bilateral shoulder pain and his muscles hurt. Denies any chest pain, sob or n/v. Reports a fever since last night and using OTC the medications.

## 2012-10-03 NOTE — ED Notes (Signed)
Pt ambulatory leaving ED with wife. Pt given discharge instructions. Pt and wife had no further questions upon discharge. Pt does not appear to be in any acute distress.

## 2012-10-03 NOTE — ED Provider Notes (Signed)
History     CSN: [id number]  Arrival date & time 10/03/12  1813   First MD Initiated Contact with Patient 10/03/12 2049      Chief Complaint  Patient presents with  . Shoulder Pain    (Consider location/radiation/quality/duration/timing/severity/associated sxs/prior treatment) HPI Comments: Patient states, that on Saturday, he hit the back of his head on the headboard of his bed.  He proceeded to go fishing today.  He drove home Sunday again, went back fishing yesterday returned with myalgias, low-grade fever to 100.4, not responsive to over-the-counter Bayer back and body.  Also, reports, myalgias.  Denies sore throat, URI, symptoms, headache, nausea, vomiting, diarrhea, dysuria.  Is very concerned with the neck, discomfort,/stiffness  Patient is a 78 y.o. male presenting with shoulder pain. The history is provided by the patient.  Shoulder Pain This is a new problem. The current episode started in the past 7 days. The problem occurs constantly. The problem has been unchanged. Associated symptoms include arthralgias, chills, a fever, myalgias and neck pain. Pertinent negatives include no abdominal pain, headaches, nausea, numbness, rash, sore throat, vomiting or weakness.    Past Medical History  Diagnosis Date  . Hypertension   . Osteoarthritis   . Rosacea, acne     Past Surgical History  Procedure Date  . Glaucoma surgery   . Joint replacement     total knee replacement biltateral    Family History  Problem Relation Age of Onset  . Dementia Mother   . Heart disease Father     History  Substance Use Topics  . Smoking status: Former Smoker    Quit date: 01/27/1974  . Smokeless tobacco: Not on file   Comment: quit smoking 40 years ago  . Alcohol Use: 3.5 oz/week    7 drink(s) per week      Review of Systems  Constitutional: Positive for fever and chills.  HENT: Positive for neck pain. Negative for sore throat and rhinorrhea.   Gastrointestinal: Negative for  nausea, vomiting and abdominal pain.  Musculoskeletal: Positive for myalgias and arthralgias.  Skin: Negative for rash and wound.  Neurological: Negative for dizziness, speech difficulty, weakness, numbness and headaches.    Allergies  Celecoxib and Sulfonamide derivatives  Home Medications   Current Outpatient Rx  Name Route Sig Dispense Refill  . AMLODIPINE BESYLATE 10 MG PO TABS  TAKE ONE TABLET BY MOUTH EVERY DAY 90 tablet 3  . BAYER BACK & BODY PAIN EX ST PO Oral Take 2 tablets by mouth daily as needed. For pain.    Marland Kitchen BENAZEPRIL-HYDROCHLOROTHIAZIDE 20-25 MG PO TABS  TAKE ONE TABLET BY MOUTH EVERY DAY 90 tablet 3    BP 144/75  Pulse 70  Temp 98.3 F (36.8 C) (Oral)  Resp 16  SpO2 95%  Physical Exam  Constitutional: He is oriented to person, place, and time. He appears well-developed and well-nourished.  HENT:  Head: Normocephalic.  Eyes: Pupils are equal, round, and reactive to light.  Neck: Muscular tenderness present. No Brudzinski's sign and no Kernig's sign noted.    Cardiovascular: Normal rate.   Pulmonary/Chest: Effort normal.  Abdominal: Soft.  Musculoskeletal: Normal range of motion. He exhibits tenderness. He exhibits no edema.  Neurological: He is alert and oriented to person, place, and time.  Skin: Skin is warm and dry. No rash noted. No erythema.    ED Course  Procedures (including critical care time)  Labs Reviewed  CBC - Abnormal; Notable for the following:    RBC  4.19 (*)     Hemoglobin 12.4 (*)     HCT 35.4 (*)     All other components within normal limits  POCT I-STAT, CHEM 8 - Abnormal; Notable for the following:    Potassium 3.4 (*)     Glucose, Bld 119 (*)     Calcium, Ion 1.11 (*)     Hemoglobin 11.9 (*)     HCT 35.0 (*)     All other components within normal limits  URINALYSIS, ROUTINE W REFLEX MICROSCOPIC   No results found.   1. Viral illness       MDM   Lab work, has been reviewed patient was given an IM injection of  Toradol, which has relieved his symptoms       Arman Filter, NP 10/03/12 2241  Arman Filter, NP 10/03/12 2241

## 2012-10-03 NOTE — ED Notes (Signed)
Pt denies numbness and tingling. Pt denies headache. Pt denies chest pain and SOB.

## 2012-10-05 NOTE — ED Provider Notes (Signed)
Medical screening examination/treatment/procedure(s) were conducted as a shared visit with non-physician practitioner(s) and myself.  I personally evaluated the patient during the encounter Pt with muscular shoulder pain.  Low grade fever today only.  Denies associated sx.  Well appearing no HA or signs of meningitis.  No typical of RMSF  Gwyneth Sprout, MD 10/05/12 1414

## 2012-11-01 ENCOUNTER — Ambulatory Visit (INDEPENDENT_AMBULATORY_CARE_PROVIDER_SITE_OTHER): Payer: Medicare Other | Admitting: Internal Medicine

## 2012-11-01 DIAGNOSIS — Z23 Encounter for immunization: Secondary | ICD-10-CM | POA: Diagnosis not present

## 2012-11-07 ENCOUNTER — Encounter: Payer: Self-pay | Admitting: Internal Medicine

## 2012-11-13 ENCOUNTER — Ambulatory Visit: Payer: Medicare Other | Admitting: Internal Medicine

## 2012-11-13 ENCOUNTER — Other Ambulatory Visit: Payer: Self-pay | Admitting: Dermatology

## 2012-11-13 DIAGNOSIS — D0439 Carcinoma in situ of skin of other parts of face: Secondary | ICD-10-CM | POA: Diagnosis not present

## 2012-11-13 DIAGNOSIS — D485 Neoplasm of uncertain behavior of skin: Secondary | ICD-10-CM | POA: Diagnosis not present

## 2012-11-13 DIAGNOSIS — L570 Actinic keratosis: Secondary | ICD-10-CM | POA: Diagnosis not present

## 2012-11-13 DIAGNOSIS — Z85828 Personal history of other malignant neoplasm of skin: Secondary | ICD-10-CM | POA: Diagnosis not present

## 2012-11-13 DIAGNOSIS — L821 Other seborrheic keratosis: Secondary | ICD-10-CM | POA: Diagnosis not present

## 2012-11-13 DIAGNOSIS — D045 Carcinoma in situ of skin of trunk: Secondary | ICD-10-CM | POA: Diagnosis not present

## 2012-11-13 DIAGNOSIS — D0460 Carcinoma in situ of skin of unspecified upper limb, including shoulder: Secondary | ICD-10-CM | POA: Diagnosis not present

## 2012-11-23 DIAGNOSIS — C44320 Squamous cell carcinoma of skin of unspecified parts of face: Secondary | ICD-10-CM | POA: Diagnosis not present

## 2012-11-27 DIAGNOSIS — C44621 Squamous cell carcinoma of skin of unspecified upper limb, including shoulder: Secondary | ICD-10-CM | POA: Diagnosis not present

## 2012-11-27 DIAGNOSIS — Z85828 Personal history of other malignant neoplasm of skin: Secondary | ICD-10-CM | POA: Diagnosis not present

## 2013-02-22 ENCOUNTER — Telehealth: Payer: Self-pay | Admitting: Internal Medicine

## 2013-02-22 MED ORDER — AMLODIPINE BESYLATE 10 MG PO TABS: 10.0000 mg | ORAL_TABLET | Freq: Every day | ORAL | Status: DC

## 2013-02-22 MED ORDER — BENAZEPRIL-HYDROCHLOROTHIAZIDE 20-25 MG PO TABS: ORAL_TABLET | ORAL | Status: DC

## 2013-02-22 NOTE — Telephone Encounter (Signed)
done

## 2013-02-22 NOTE — Telephone Encounter (Signed)
Patient called and stated that he needs a refill on his amLODipine (NORVASC) 10 MG tablet and his benazepril-hydrochlorthiazide (LOTENSIN HCT) 20-25 MG per tablet. He is utilizing [facility] [address] as his current pharmacy. Please assist.

## 2013-05-14 ENCOUNTER — Ambulatory Visit (INDEPENDENT_AMBULATORY_CARE_PROVIDER_SITE_OTHER): Payer: Medicare Other | Admitting: Internal Medicine

## 2013-05-14 ENCOUNTER — Encounter: Payer: Self-pay | Admitting: Internal Medicine

## 2013-05-14 VITALS — BP 140/80 | HR 72 | Temp 98.6°F | Resp 16 | Ht 68.0 in | Wt 191.0 lb

## 2013-05-14 DIAGNOSIS — L719 Rosacea, unspecified: Secondary | ICD-10-CM

## 2013-05-14 DIAGNOSIS — E785 Hyperlipidemia, unspecified: Secondary | ICD-10-CM | POA: Diagnosis not present

## 2013-05-14 DIAGNOSIS — I10 Essential (primary) hypertension: Secondary | ICD-10-CM

## 2013-05-14 DIAGNOSIS — N401 Enlarged prostate with lower urinary tract symptoms: Secondary | ICD-10-CM | POA: Diagnosis not present

## 2013-05-14 DIAGNOSIS — Z0000 Encounter for general adult medical examination without abnormal findings: Secondary | ICD-10-CM

## 2013-05-14 DIAGNOSIS — N139 Obstructive and reflux uropathy, unspecified: Secondary | ICD-10-CM | POA: Diagnosis not present

## 2013-05-14 DIAGNOSIS — N138 Other obstructive and reflux uropathy: Secondary | ICD-10-CM

## 2013-05-14 LAB — LIPID PANEL
Cholesterol: 182 mg/dL (ref 0–200)
LDL Cholesterol: 122 mg/dL — ABNORMAL HIGH (ref 0–99)
Total CHOL/HDL Ratio: 4
Triglycerides: 91 mg/dL (ref 0.0–149.0)

## 2013-05-14 LAB — BASIC METABOLIC PANEL
BUN: 6 mg/dL (ref 6–23)
CO2: 29 mEq/L (ref 19–32)
Calcium: 9.1 mg/dL (ref 8.4–10.5)
Chloride: 97 mEq/L (ref 96–112)
Creatinine, Ser: 0.8 mg/dL (ref 0.4–1.5)
GFR: 95.53 mL/min (ref >60.00)
Glucose, Bld: 98 mg/dL (ref 70–99)
Potassium: 4.1 mEq/L (ref 3.5–5.1)
Sodium: 134 mEq/L — ABNORMAL LOW (ref 135–145)

## 2013-05-14 LAB — CBC WITH DIFFERENTIAL/PLATELET
Basophils Absolute: 0 10*3/uL (ref 0.0–0.1)
Basophils Relative: 0.6 % (ref 0.0–3.0)
Eosinophils Absolute: 0.3 10*3/uL (ref 0.0–0.7)
Eosinophils Relative: 4.7 % (ref 0.0–5.0)
HCT: 40.5 % (ref 39.0–52.0)
Hemoglobin: 13.7 g/dL (ref 13.0–17.0)
Lymphocytes Relative: 33.3 % (ref 12.0–46.0)
Lymphs Abs: 2.1 10*3/uL (ref 0.7–4.0)
MCHC: 33.9 g/dL (ref 30.0–36.0)
MCV: 86.6 fl (ref 78.0–100.0)
Monocytes Absolute: 0.6 10*3/uL (ref 0.1–1.0)
Monocytes Relative: 9.8 % (ref 3.0–12.0)
Neutro Abs: 3.2 10*3/uL (ref 1.4–7.7)
Neutrophils Relative %: 51.6 % (ref 43.0–77.0)
Platelets: 219 10*3/uL (ref 150.0–400.0)
RBC: 4.68 Mil/uL (ref 4.22–5.81)
RDW: 13.9 % (ref 11.5–14.6)
WBC: 6.2 10*3/uL (ref 4.5–10.5)

## 2013-05-14 LAB — HEPATIC FUNCTION PANEL
ALT: 16 U/L (ref 0–53)
AST: 19 U/L (ref 0–37)
Albumin: 3.9 g/dL (ref 3.5–5.2)
Alkaline Phosphatase: 70 U/L (ref 39–117)
Bilirubin, Direct: 0.1 mg/dL (ref 0.0–0.3)
Total Bilirubin: 0.6 mg/dL (ref 0.3–1.2)
Total Protein: 7 g/dL (ref 6.0–8.3)

## 2013-05-14 LAB — PSA: PSA: 3.73 ng/mL (ref 0.10–4.00)

## 2013-05-14 MED ORDER — BENZOYL PEROXIDE-ERYTHROMYCIN 5-3 % EX GEL: Freq: Two times a day (BID) | CUTANEOUS | Status: DC

## 2013-05-14 MED ORDER — BENAZEPRIL-HYDROCHLOROTHIAZIDE 20-25 MG PO TABS: ORAL_TABLET | ORAL | Status: DC

## 2013-05-14 NOTE — Patient Instructions (Signed)
Please get audiometry

## 2013-05-14 NOTE — Addendum Note (Signed)
Addended by: [name] on: 05/14/2013 09:44 AM   Modules accepted: Orders

## 2013-05-14 NOTE — Progress Notes (Signed)
Subjective:    Patient ID: Mitchell Walker, male    DOB: [birth year], 79 y.o.   MRN: [id number]  HPI  Presents for followup of acne rosacea hypertension hyperlipidemia He is status post bilateral knee replacement His wife reports that he has had some decreased concentration and word searching but he is still able to do calculations without difficulty and he does not get lost while driving  Review of Systems  Constitutional: Negative for fever and fatigue.  HENT: Negative for hearing loss, congestion, neck pain and postnasal drip.   Eyes: Negative for discharge, redness and visual disturbance.  Respiratory: Negative for cough, shortness of breath and wheezing.   Cardiovascular: Negative for leg swelling.  Gastrointestinal: Negative for abdominal pain, constipation and abdominal distention.  Genitourinary: Negative for urgency and frequency.  Musculoskeletal: Negative for joint swelling and arthralgias.  Skin: Negative for color change and rash.  Neurological: Negative for weakness and light-headedness.  Hematological: Negative for adenopathy.  Psychiatric/Behavioral: Positive for decreased concentration. Negative for behavioral problems.       Word searching       Objective:   Physical Exam  Nursing note and vitals reviewed. Constitutional: He is oriented to person, place, and time. He appears well-developed and well-nourished.  HENT:  Head: Normocephalic and atraumatic.  Eyes: Conjunctivae are normal. Pupils are equal, round, and reactive to light.  Neck: Normal range of motion. Neck supple.  Cardiovascular: Normal rate and regular rhythm.   Murmur heard. Pulmonary/Chest: Effort normal and breath sounds normal.  Abdominal: Soft. Bowel sounds are normal.  Genitourinary:  Enlarged prostate  Musculoskeletal:  Linear scars from bilateral knee replacement  Neurological: He is oriented to person, place, and time. No cranial nerve deficit. Coordination normal.  Skin: Skin is warm and  dry.          Assessment & Plan:  Mild memory issues most probably secondary to hearing loss Recommend spirometry.  Acne rosacea treated by retiring dermatologist who will assume responsibility for the prescription.  Stable hypertension monitor appropriate blood work hyperlipidemia monitor lipids and liver panel BPH with obstruction monitor PSA Subjective:    Mitchell Walker is a 79 y.o. male who presents for Medicare Annual/Subsequent preventive examination.   Preventive Screening-Counseling & Management  Tobacco History  Smoking status  . Former Smoker  . Quit date: 01/27/1974  Smokeless tobacco  . Not on file    Comment: quit smoking 40 years ago    Problems Prior to Visit 1.   Current Problems (verified) Patient Active Problem List   Diagnosis Date Noted  . CHRONIC FRONTAL SINUSITIS 12/30/2010  . MUSCULOSKELETAL PAIN 11/23/2010  . EXTERNAL HEMORRHOIDS, THROMBOSED 08/12/2010  . ACNE ROSACEA 04/15/2010  . HYPERLIPIDEMIA, ATHEROGENIC 02/03/2009  . ACTINIC KERATOSIS 02/03/2009  . CARCINOMA, BASAL CELL, SKIN 02/29/2008  . ROTATOR CUFF TEAR 02/06/2008  . ERECTILE DYSFUNCTION 12/11/2007  . HYPERTENSION 07/17/2007  . OSTEOARTHRITIS 07/17/2007    Medications Prior to Visit Current Outpatient Prescriptions on File Prior to Visit  Medication Sig Dispense Refill  . amLODipine (NORVASC) 10 MG tablet Take 1 tablet (10 mg total) by mouth daily.  90 tablet  3  . Aspirin-Caffeine (BAYER BACK & BODY PAIN EX ST PO) Take 2 tablets by mouth daily as needed. For pain.      . benazepril-hydrochlorthiazide (LOTENSIN HCT) 20-25 MG per tablet TAKE ONE TABLET BY MOUTH EVERY DAY  90 tablet  3   No current facility-administered medications on file prior to visit.    Current  Medications (verified) Current Outpatient Prescriptions  Medication Sig Dispense Refill  . amLODipine (NORVASC) 10 MG tablet Take 1 tablet (10 mg total) by mouth daily.  90 tablet  3  . Aspirin-Caffeine (BAYER  BACK & BODY PAIN EX ST PO) Take 2 tablets by mouth daily as needed. For pain.      . benazepril-hydrochlorthiazide (LOTENSIN HCT) 20-25 MG per tablet TAKE ONE TABLET BY MOUTH EVERY DAY  90 tablet  3   No current facility-administered medications for this visit.     Allergies (verified) Celecoxib and Sulfonamide derivatives   PAST HISTORY  Family History Family History  Problem Relation Age of Onset  . Dementia Mother   . Heart disease Father     Social History History  Substance Use Topics  . Smoking status: Former Smoker    Quit date: 01/27/1974  . Smokeless tobacco: Not on file     Comment: quit smoking 40 years ago  . Alcohol Use: 3.5 oz/week    7 drink(s) per week    Are there smokers in your home (other than you)?  No  Risk Factors Current exercise habits: Home exercise routine includes treadmill.  Dietary issues discussed: add fish oil   Cardiac risk factors: dyslipidemia, hypertension and male gender.  Depression Screen (Note: if answer to either of the following is "Yes", a more complete depression screening is indicated)   Q1: Over the past two weeks, have you felt down, depressed or hopeless? No  Q2: Over the past two weeks, have you felt little interest or pleasure in doing things? No  Have you lost interest or pleasure in daily life? No  Do you often feel hopeless? No  Do you cry easily over simple problems? No  Activities of Daily Living In your present state of health, do you have any difficulty performing the following activities?:  Driving? No Managing money?  No Feeding yourself? No Getting from bed to chair? No Climbing a flight of stairs? No Preparing food and eating?: No Bathing or showering? No Getting dressed: No Getting to the toilet? No Using the toilet:No Moving around from place to place: No In the past year have you fallen or had a near fall?:No   Are you sexually active?  Yes  Do you have more than one partner?  No  Hearing  Difficulties: No Do you often ask people to speak up or repeat themselves? No Do you experience ringing or noises in your ears? Yes Do you have difficulty understanding soft or whispered voices? Yes   Do you feel that you have a problem with memory? No  Do you often misplace items? Yes  Do you feel safe at home?  Yes  Cognitive Testing  Alert? Yes  Normal Appearance?Yes  Oriented to person? Yes  Place? Yes   Time? Yes  Recall of three objects?  Yes  Can perform simple calculations? Yes  Displays appropriate judgment?Yes  Can read the correct time from a watch face?No   Advanced Directives have been discussed with the patient? Yes   List the Names of Other Physician/Practitioners you currently use: 1.    Indicate any recent Medical Services you may have received from other than [facility] providers in the past year (date may be approximate).  Immunization History  Administered Date(s) Administered  . Influenza Split 11/18/2011, 11/01/2012  . Influenza Whole 09/01/2010  . Pneumococcal Polysaccharide 12/13/2004, 02/11/2010  . Td 12/14/2003  . Zoster 03/04/2010    Screening Tests Health Maintenance  Topic  Date Due  . Influenza Vaccine  08/13/2013  . Tetanus/tdap  12/13/2013  . Colonoscopy  02/25/2020  . Pneumococcal Polysaccharide Vaccine Age 84 And Over  Completed  . Zostavax  Completed    All answers were reviewed with the patient and necessary referrals were made:  Carrie Mew, MD   05/14/2013   History reviewed: allergies, current medications, past family history, past medical history, past social history, past surgical history and problem list  Review of Systems Pertinent items are noted in HPI.    Objective:     Vision by Snellen chart: right eye:20/20, left eye:20/20 Blood pressure 140/80, pulse 72, temperature 98.6 F (37 C), resp. rate 16, height 5\' 8"  (1.727 m), weight 191 lb (86.637 kg). Body mass index is 29.05 kg/(m^2).  Exam per acute visit  section     Assessment:      Patient presents for yearly preventative medicine examination.   all immunizations and health maintenance protocols were reviewed with the patient and they are up to date with these protocols.   screening laboratory values were reviewed with the patient including screening of hyperlipidemia PSA renal function and hepatic function.   There medications past medical history social history problem list and allergies were reviewed in detail.   Goals were established with regard to weight loss exercise diet in compliance with medications      Plan:     During the course of the visit the patient was educated and counseled about appropriate screening and preventive services including:    Influenza vaccine  Td vaccine  Prostate cancer screening  Diet review for nutrition referral? Yes ____  Not Indicated ____   Patient Instructions (the written plan) was given to the patient.  Medicare Attestation I have personally reviewed: The patient's medical and social history Their use of alcohol, tobacco or illicit drugs Their current medications and supplements The patient's functional ability including ADLs,fall risks, home safety risks, cognitive, and hearing and visual impairment Diet and physical activities Evidence for depression or mood disorders  The patient's weight, height, BMI, and visual acuity have been recorded in the chart.  I have made referrals, counseling, and provided education to the patient based on review of the above and I have provided the patient with a written personalized care plan for preventive services.     Carrie Mew, MD   05/14/2013

## 2013-05-21 ENCOUNTER — Telehealth: Payer: Self-pay | Admitting: Internal Medicine

## 2013-05-21 MED ORDER — AMLODIPINE BESYLATE 10 MG PO TABS: 10.0000 mg | ORAL_TABLET | Freq: Every day | ORAL | Status: DC

## 2013-05-21 NOTE — Telephone Encounter (Signed)
done

## 2013-05-21 NOTE — Telephone Encounter (Signed)
PT states that he was seen last Monday, and Dr. Lovell Sheehan usually calls in his amlodipine at that time. It was not called in, and the pt is wondering if he's still supposed to be taking it. Please assist.

## 2013-07-26 DIAGNOSIS — H01009 Unspecified blepharitis unspecified eye, unspecified eyelid: Secondary | ICD-10-CM | POA: Diagnosis not present

## 2013-07-26 DIAGNOSIS — H40249 Residual stage of angle-closure glaucoma, unspecified eye: Secondary | ICD-10-CM | POA: Diagnosis not present

## 2013-07-26 DIAGNOSIS — H259 Unspecified age-related cataract: Secondary | ICD-10-CM | POA: Diagnosis not present

## 2013-07-26 DIAGNOSIS — H5200 Hypermetropia, unspecified eye: Secondary | ICD-10-CM | POA: Diagnosis not present

## 2013-09-11 ENCOUNTER — Ambulatory Visit (INDEPENDENT_AMBULATORY_CARE_PROVIDER_SITE_OTHER): Payer: Medicare Other | Admitting: *Deleted

## 2013-09-11 DIAGNOSIS — Z23 Encounter for immunization: Secondary | ICD-10-CM | POA: Diagnosis not present

## 2013-11-12 ENCOUNTER — Ambulatory Visit: Payer: Medicare Other | Admitting: Internal Medicine

## 2014-03-28 ENCOUNTER — Encounter: Payer: Self-pay | Admitting: Physician Assistant

## 2014-03-28 ENCOUNTER — Ambulatory Visit (INDEPENDENT_AMBULATORY_CARE_PROVIDER_SITE_OTHER): Payer: Medicare Other | Admitting: Physician Assistant

## 2014-03-28 VITALS — BP 134/68 | HR 68 | Temp 97.7°F | Resp 16 | Ht 68.0 in | Wt 191.0 lb

## 2014-03-28 DIAGNOSIS — J019 Acute sinusitis, unspecified: Secondary | ICD-10-CM | POA: Diagnosis not present

## 2014-03-28 MED ORDER — AMOXICILLIN-POT CLAVULANATE 875-125 MG PO TABS: 1.0000 | ORAL_TABLET | Freq: Two times a day (BID) | ORAL | Status: DC

## 2014-03-28 NOTE — Progress Notes (Signed)
Pre visit review using our clinic review tool, if applicable. No additional management support is needed unless otherwise documented below in the visit note. 

## 2014-03-28 NOTE — Patient Instructions (Signed)
Augmentin twice a day for 10 days.  Plain OTC Mucinex (NOT D) for thick secretions ;force NON dairy fluids .    OTC Nasacort AQ 1 spray in each nostril twice a day as needed. Use the "crossover" technique into opposite nostril spraying toward opposite ear @ 45 degree angle, not straight up into nostril.   OTC Zyrtec 10 mg @ bedtime  as needed for itchy eyes & sneezing.  Followup if the symptoms worsen or do not improve despite treatment.  Sinusitis Sinusitis is redness, soreness, and puffiness (inflammation) of the air pockets in the bones of your face (sinuses). The redness, soreness, and puffiness can cause air and mucus to get trapped in your sinuses. This can allow germs to grow and cause an infection.  HOME CARE   Drink enough fluids to keep your pee (urine) clear or pale yellow.  Use a humidifier in your home.  Run a hot shower to create steam in the bathroom. Sit in the bathroom with the door closed. Breathe in the steam 3 4 times a day.  Put a warm, moist washcloth on your face 3 4 times a day, or as told by your doctor.  Use salt water sprays (saline sprays) to wet the thick fluid in your nose. This can help the sinuses drain.  Only take medicine as told by your doctor. GET HELP RIGHT AWAY IF:   Your pain gets worse.  You have very bad headaches.  You are sick to your stomach (nauseous).  You throw up (vomit).  You are very sleepy (drowsy) all the time.  Your face is puffy (swollen).  Your vision changes.  You have a stiff neck.  You have trouble breathing. MAKE SURE YOU:   Understand these instructions.  Will watch your condition.  Will get help right away if you are not doing well or get worse. Document Released: 05/17/2008 Document Revised: 08/23/2012 Document Reviewed: 07/04/2012 [facility] Patient Information 2014 [place].

## 2014-03-28 NOTE — Progress Notes (Signed)
Subjective:    Patient ID: Mitchell Walker, male    DOB: 1934-09-24, 80 y.o.   MRN: [id number]  Cough  Sinusitis Associated symptoms include coughing.   Patient is a 80 year old white male presenting today for sinus congestion and cough. He states that 3 weeks ago he developed a cold/URI which he took Robitussin for and which resolved after approximately one week. Less than a week later he began to develop the same symptoms once again, this time Robitussin did not help improve the symptoms. His symptoms include postnasal drip, congestion of his sinuses more so on the left side of his face, nonproductive dry cough. He denies fevers, chills, nausea, vomiting, diarrhea, shortness of breath.   Review of Systems  Respiratory: Positive for cough.   ROS: as mentioned in the history of present illness and are otherwise negative.  Past Medical History  Diagnosis Date  . Hypertension   . Osteoarthritis   . Rosacea, acne    Past Surgical History  Procedure Laterality Date  . Glaucoma surgery    . Joint replacement      total knee replacement biltateral    reports that he quit smoking about 40 years ago. He does not have any smokeless tobacco history on file. He reports that he drinks about 3.5 ounces of alcohol per week. He reports that he does not use illicit drugs. family history includes Dementia in his mother; Heart disease in his father. Allergies  Allergen Reactions  . Celecoxib     REACTION: Upset GI  . Sulfonamide Derivatives     REACTION: rash       Objective:   Physical Exam  Nursing note and vitals reviewed. Constitutional: He is oriented to person, place, and time. He appears well-developed and well-nourished. No distress.  HENT:  Head: Normocephalic and atraumatic.  Right Ear: External ear normal.  Left Ear: External ear normal.  Nose: Nose normal.  Mouth/Throat: No oropharyngeal exudate.  Oropharynx is slightly erythematous no exudate present.  Left and right  tympanic membranes are normal in appearance.  Sinus tenderness is mild and concentrated at the left maxillary sinus. Bilateral frontal sinuses nontender right maxillary sinus nontender.  Eyes: Conjunctivae and EOM are normal. Pupils are equal, round, and reactive to light.  Neck: Normal range of motion. Neck supple. No tracheal deviation present. No thyromegaly present.  Cardiovascular: Normal rate, regular rhythm, normal heart sounds and intact distal pulses.  Exam reveals no gallop and no friction rub.   No murmur heard. Pulmonary/Chest: Effort normal and breath sounds normal. No stridor. No respiratory distress. He has no wheezes. He has no rales. He exhibits no tenderness.  Abdominal: Soft. Bowel sounds are normal. There is no tenderness.  Musculoskeletal: Normal range of motion. He exhibits no edema.  Lymphadenopathy:    He has no cervical adenopathy.  Neurological: He is alert and oriented to person, place, and time. He has normal reflexes. No cranial nerve deficit. Coordination normal.  Skin: Skin is warm and dry. No rash noted. He is not diaphoretic. No erythema. No pallor.  Psychiatric: He has a normal mood and affect. His behavior is normal. Judgment and thought content normal.    Filed Vitals:   03/28/14 1101  BP: 134/68  Pulse: 68  Temp: 97.7 F (36.5 C)  Resp: 16   Lab Results  Component Value Date   WBC 6.2 05/14/2013   HGB 13.7 05/14/2013   HCT 40.5 05/14/2013   PLT 219.0 05/14/2013   GLUCOSE 98 05/14/2013  CHOL 182 05/14/2013   TRIG 91.0 05/14/2013   HDL 41.40 05/14/2013   LDLDIRECT 127.3 02/11/2010   LDLCALC 122* 05/14/2013   ALT 16 05/14/2013   AST 19 05/14/2013   NA 134* 05/14/2013   K 4.1 05/14/2013   CL 97 05/14/2013   CREATININE 0.8 05/14/2013   BUN 6 05/14/2013   CO2 29 05/14/2013   TSH 1.85 05/14/2013   PSA 3.73 05/14/2013         Assessment & Plan:  Mitchell Walker was seen today for cough and sinusitis.  Diagnoses and associated orders for this visit:  Acute  sinusitis - amoxicillin-clavulanate (AUGMENTIN) 875-125 MG per tablet; Take 1 tablet by mouth 2 (two) times daily. - Plain OTC Mucinex (NOT D) for thick secretions ;force NON dairy fluids .    -OTC Nasacort AQ 1 spray in each nostril twice a day as needed. Use the "crossover" technique into opposite nostril spraying toward opposite ear @ 45 degree angle, not straight up into nostril.   -OTC Zyrtec 10 mg @ bedtime  as needed for itchy eyes & sneezing.  Followup if symptoms worsen or do not improve despite treatment.

## 2014-05-04 ENCOUNTER — Other Ambulatory Visit: Payer: Self-pay | Admitting: Internal Medicine

## 2014-05-20 ENCOUNTER — Encounter: Payer: Self-pay | Admitting: Internal Medicine

## 2014-05-20 ENCOUNTER — Ambulatory Visit (INDEPENDENT_AMBULATORY_CARE_PROVIDER_SITE_OTHER): Payer: Medicare Other | Admitting: Internal Medicine

## 2014-05-20 VITALS — BP 130/72 | HR 72 | Temp 97.8°F | Wt 188.0 lb

## 2014-05-20 DIAGNOSIS — R351 Nocturia: Secondary | ICD-10-CM | POA: Diagnosis not present

## 2014-05-20 DIAGNOSIS — N401 Enlarged prostate with lower urinary tract symptoms: Secondary | ICD-10-CM | POA: Diagnosis not present

## 2014-05-20 DIAGNOSIS — M1990 Unspecified osteoarthritis, unspecified site: Secondary | ICD-10-CM | POA: Diagnosis not present

## 2014-05-20 DIAGNOSIS — N138 Other obstructive and reflux uropathy: Secondary | ICD-10-CM | POA: Diagnosis not present

## 2014-05-20 DIAGNOSIS — Z0000 Encounter for general adult medical examination without abnormal findings: Secondary | ICD-10-CM

## 2014-05-20 DIAGNOSIS — Z125 Encounter for screening for malignant neoplasm of prostate: Secondary | ICD-10-CM | POA: Diagnosis not present

## 2014-05-20 DIAGNOSIS — Z23 Encounter for immunization: Secondary | ICD-10-CM | POA: Diagnosis not present

## 2014-05-20 DIAGNOSIS — E785 Hyperlipidemia, unspecified: Secondary | ICD-10-CM | POA: Diagnosis not present

## 2014-05-20 DIAGNOSIS — T887XXA Unspecified adverse effect of drug or medicament, initial encounter: Secondary | ICD-10-CM | POA: Diagnosis not present

## 2014-05-20 DIAGNOSIS — Z79899 Other long term (current) drug therapy: Secondary | ICD-10-CM

## 2014-05-20 DIAGNOSIS — I10 Essential (primary) hypertension: Secondary | ICD-10-CM | POA: Diagnosis not present

## 2014-05-20 LAB — CBC WITH DIFFERENTIAL/PLATELET
BASOS PCT: 0.4 % (ref 0.0–3.0)
Basophils Absolute: 0 10*3/uL (ref 0.0–0.1)
EOS ABS: 0.3 10*3/uL (ref 0.0–0.7)
Eosinophils Relative: 4.4 % (ref 0.0–5.0)
HCT: 41.7 % (ref 39.0–52.0)
Hemoglobin: 13.9 g/dL (ref 13.0–17.0)
LYMPHS ABS: 2.1 10*3/uL (ref 0.7–4.0)
Lymphocytes Relative: 32 % (ref 12.0–46.0)
MCHC: 33.2 g/dL (ref 30.0–36.0)
MCV: 87.3 fl (ref 78.0–100.0)
MONO ABS: 0.6 10*3/uL (ref 0.1–1.0)
Monocytes Relative: 9 % (ref 3.0–12.0)
NEUTROS PCT: 54.2 % (ref 43.0–77.0)
Neutro Abs: 3.5 10*3/uL (ref 1.4–7.7)
PLATELETS: 238 10*3/uL (ref 150.0–400.0)
RBC: 4.78 Mil/uL (ref 4.22–5.81)
RDW: 13.5 % (ref 11.5–15.5)
WBC: 6.5 10*3/uL (ref 4.0–10.5)

## 2014-05-20 LAB — LIPID PANEL
Cholesterol: 223 mg/dL — ABNORMAL HIGH (ref 0–200)
HDL: 47.2 mg/dL (ref >39.00)
LDL Cholesterol: 159 mg/dL — ABNORMAL HIGH (ref 0–99)
NONHDL: 175.8
Total CHOL/HDL Ratio: 5
Triglycerides: 83 mg/dL (ref 0.0–149.0)
VLDL: 16.6 mg/dL (ref 0.0–40.0)

## 2014-05-20 LAB — HEPATIC FUNCTION PANEL
ALK PHOS: 81 U/L (ref 39–117)
ALT: 18 U/L (ref 0–53)
AST: 21 U/L (ref 0–37)
Albumin: 4.2 g/dL (ref 3.5–5.2)
BILIRUBIN DIRECT: 0.2 mg/dL (ref 0.0–0.3)
BILIRUBIN TOTAL: 1 mg/dL (ref 0.2–1.2)
Total Protein: 7.3 g/dL (ref 6.0–8.3)

## 2014-05-20 LAB — PSA: PSA: 4.52 ng/mL — ABNORMAL HIGH (ref 0.10–4.00)

## 2014-05-20 LAB — BASIC METABOLIC PANEL
BUN: 10 mg/dL (ref 6–23)
CALCIUM: 9.5 mg/dL (ref 8.4–10.5)
CO2: 31 meq/L (ref 19–32)
CREATININE: 0.8 mg/dL (ref 0.4–1.5)
Chloride: 96 mEq/L (ref 96–112)
GFR: 98 mL/min (ref >60.00)
GLUCOSE: 109 mg/dL — AB (ref 70–99)
Potassium: 4.6 mEq/L (ref 3.5–5.1)
Sodium: 135 mEq/L (ref 135–145)

## 2014-05-20 LAB — TSH: TSH: 1.91 u[IU]/mL (ref 0.35–4.50)

## 2014-05-20 NOTE — Progress Notes (Signed)
Pre visit review using our clinic review tool, if applicable. No additional management support is needed unless otherwise documented below in the visit note. 

## 2014-05-20 NOTE — Progress Notes (Signed)
Subjective:    Patient ID: Mitchell Walker, male    DOB: 08-23-1934, 80 y.o.   MRN: [id number]  HPI Medicare wellness exam and follow up for lipids, HTN and arthritis Bilateral TKR riding bike daily Wife reports dry cough at night. Due tetnus today All other HM up to date    Review of Systems  Constitutional: Negative for fever and fatigue.  HENT: Negative for congestion, hearing loss and postnasal drip.   Eyes: Negative for discharge, redness and visual disturbance.  Respiratory: Positive for cough. Negative for shortness of breath and wheezing.   Cardiovascular: Negative for palpitations and leg swelling.  Gastrointestinal: Negative for abdominal pain, constipation and abdominal distention.  Genitourinary: Negative for urgency and frequency.  Musculoskeletal: Positive for arthralgias. Negative for joint swelling and neck pain.  Skin: Negative for color change and rash.  Neurological: Negative for weakness and light-headedness.  Hematological: Negative for adenopathy.  Psychiatric/Behavioral: Negative for behavioral problems.       Objective:   Physical Exam  Vitals reviewed. Constitutional: He is oriented to person, place, and time. He appears well-developed and well-nourished.  HENT:  Head: Normocephalic and atraumatic.  Eyes: Conjunctivae are normal. Pupils are equal, round, and reactive to light.  Neck: Normal range of motion. Neck supple.  Cardiovascular: Normal rate and regular rhythm.   Murmur heard. Pulmonary/Chest: Effort normal and breath sounds normal.  Abdominal: Soft. Bowel sounds are normal.  Musculoskeletal:  Scars from BKR  Neurological: He is alert and oriented to person, place, and time. No cranial nerve deficit. Coordination normal.  No knee DTR due to surgery  Psychiatric: He has a normal mood and affect. His behavior is normal.  AK on ear Rosacea on nose        Assessment & Plan:  Stable HTN measure BMET  Monitor lipids and liver PSA screen  due to BPH with nocturia   Subjective:    ROSWELL Mitchell Walker is a 80 y.o. male who presents for Medicare Annual/Subsequent preventive examination.   Preventive Screening-Counseling & Management  Tobacco History  Smoking status  . Former Smoker  . Quit date: 01/27/1974  Smokeless tobacco  . Not on file    Comment: quit smoking 40 years ago    Problems Prior to Visit 1.   Current Problems (verified) Patient Active Problem List   Diagnosis Date Noted  . CHRONIC FRONTAL SINUSITIS 12/30/2010  . MUSCULOSKELETAL PAIN 11/23/2010  . EXTERNAL HEMORRHOIDS, THROMBOSED 08/12/2010  . ACNE ROSACEA 04/15/2010  . [place], [place] 02/03/2009  . ACTINIC KERATOSIS 02/03/2009  . CARCINOMA, BASAL CELL, SKIN 02/29/2008  . ROTATOR CUFF TEAR 02/06/2008  . ERECTILE DYSFUNCTION 12/11/2007  . HYPERTENSION 07/17/2007  . OSTEOARTHRITIS 07/17/2007    Medications Prior to Visit Current Outpatient Prescriptions on File Prior to Visit  Medication Sig Dispense Refill  . amLODipine (NORVASC) 10 MG tablet Take 1 tablet (10 mg total) by mouth daily.  90 tablet  3  . Aspirin-Caffeine (BAYER BACK & BODY PAIN EX ST PO) Take 2 tablets by mouth daily as needed. For pain.      . benazepril-hydrochlorthiazide (LOTENSIN HCT) 20-25 MG per tablet TAKE ONE TABLET BY MOUTH ONCE DAILY  90 tablet  0   No current facility-administered medications on file prior to visit.    Current Medications (verified) Current Outpatient Prescriptions  Medication Sig Dispense Refill  . amLODipine (NORVASC) 10 MG tablet Take 1 tablet (10 mg total) by mouth daily.  90 tablet  3  . Aspirin-Caffeine (BAYER  BACK & BODY PAIN EX ST PO) Take 2 tablets by mouth daily as needed. For pain.      . benazepril-hydrochlorthiazide (LOTENSIN HCT) 20-25 MG per tablet TAKE ONE TABLET BY MOUTH ONCE DAILY  90 tablet  0   No current facility-administered medications for this visit.     Allergies (verified) Celecoxib and Sulfonamide  derivatives   PAST HISTORY  Family History Family History  Problem Relation Age of Onset  . Dementia Mother   . Heart disease Father     Social History History  Substance Use Topics  . Smoking status: Former Smoker    Quit date: 01/27/1974  . Smokeless tobacco: Not on file     Comment: quit smoking 40 years ago  . Alcohol Use: 3.5 oz/week    7 drink(s) per week    Are there smokers in your home (other than you)?  No  Risk Factors Current exercise habits: Home exercise routine includes cycling.  Dietary issues discussed: diet   Cardiac risk factors: advanced age (older than 15 for men, 1 for women), dyslipidemia, hypertension and male gender.  Depression Screen (Note: if answer to either of the following is "Yes", a more complete depression screening is indicated)   Q1: Over the past two weeks, have you felt down, depressed or hopeless? No  Q2: Over the past two weeks, have you felt little interest or pleasure in doing things? No  Have you lost interest or pleasure in daily life? No  Do you often feel hopeless? No  Do you cry easily over simple problems? No  Activities of Daily Living In your present state of health, do you have any difficulty performing the following activities?:  Driving? No Managing money?  No Feeding yourself? No Getting from bed to chair? No Climbing a flight of stairs? No Preparing food and eating?: No Bathing or showering? No Getting dressed: No Getting to the toilet? No Using the toilet:No Moving around from place to place: No In the past year have you fallen or had a near fall?:No   Are you sexually active?  Yes  Do you have more than one partner?  No  Hearing Difficulties: No Do you often ask people to speak up or repeat themselves? No Do you experience ringing or noises in your ears? No Do you have difficulty understanding soft or whispered voices? No   Do you feel that you have a problem with memory? No  Do you often misplace  items? No  Do you feel safe at home?  Yes  Cognitive Testing  Alert? Yes  Normal Appearance?Yes  Oriented to person? Yes  Place? Yes   Time? Yes  Recall of three objects?  Yes  Can perform simple calculations? Yes  Displays appropriate judgment?Yes  Can read the correct time from a watch face?Yes   Advanced Directives have been discussed with the patient? Yes   List the Names of Other Physician/Practitioners you currently use: 1.    Indicate any recent Medical Services you may have received from other than [facility] providers in the past year (date may be approximate).  Immunization History  Administered Date(s) Administered  . Influenza Split 11/18/2011, 11/01/2012  . Influenza Whole 09/01/2010  . Influenza,inj,Quad PF,36+ Mos 09/11/2013  . Pneumococcal Polysaccharide-23 12/13/2004, 02/11/2010  . Td 12/14/2003  . Zoster 03/04/2010    Screening Tests Health Maintenance  Topic Date Due  . Tetanus/tdap  12/13/2013  . Influenza Vaccine  07/13/2014  . Colonoscopy  02/25/2020  . Pneumococcal  Polysaccharide Vaccine Age 20 And Over  Completed  . Zostavax  Completed    All answers were reviewed with the patient and necessary referrals were made:  Georgetta Haber, MD   05/20/2014   History reviewed: allergies, current medications, past family history, past medical history, past social history, past surgical history and problem list  Review of Systems Pertinent items are noted in HPI.    Objective:     Vision by Snellen chart: right eye:20/20, left eye:20/20 glasses Blood pressure 130/72, pulse 72, temperature 97.8 F (36.6 C), temperature source Oral, weight 188 lb (85.276 kg), SpO2 98.00%. Body mass index is 28.59 kg/(m^2).  Exam per problem focused visit     Assessment:     Patient presents for yearly preventative medicine examination. Medicare questionnaire was completed  All immunizations and health maintenance protocols were reviewed with the patient and  needed orders were placed.  Appropriate screening laboratory values were ordered for the patient including screening of hyperlipidemia, renal function and hepatic function. If indicated by BPH, a PSA was ordered.  Medication reconciliation,  past medical history, social history, problem list and allergies were reviewed in detail with the patient  Goals were established with regard to weight loss, exercise, and  diet in compliance with medications  End of life planning was discussed.       Plan:     During the course of the visit the patient was educated and counseled about appropriate screening and preventive services including:    TD  PSA  Screening for lipids  Diet review for nutrition referral? Yes ____  Not Indicated ____   Patient Instructions (the written plan) was given to the patient.  Medicare Attestation I have personally reviewed: The patient's medical and social history Their use of alcohol, tobacco or illicit drugs Their current medications and supplements The patient's functional ability including ADLs,fall risks, home safety risks, cognitive, and hearing and visual impairment Diet and physical activities Evidence for depression or mood disorders  The patient's weight, height, BMI, and visual acuity have been recorded in the chart.  I have made referrals, counseling, and provided education to the patient based on review of the above and I have provided the patient with a written personalized care plan for preventive services.     Georgetta Haber, MD   05/20/2014

## 2014-05-20 NOTE — Patient Instructions (Addendum)
The patient is instructed to continue all medications as prescribed. Schedule followup with check out clerk upon leaving the clinic  wife with Colin Benton

## 2014-05-20 NOTE — Addendum Note (Signed)
Addended by: Colleen Can on: 05/20/2014 11:32 AM   Modules accepted: Orders

## 2014-05-21 ENCOUNTER — Telehealth: Payer: Self-pay | Admitting: Internal Medicine

## 2014-05-21 NOTE — Telephone Encounter (Signed)
Relevant patient education assigned to patient using Emmi. ° °

## 2014-08-22 ENCOUNTER — Other Ambulatory Visit: Payer: Self-pay | Admitting: Internal Medicine

## 2014-08-22 ENCOUNTER — Telehealth: Payer: Self-pay | Admitting: Internal Medicine

## 2014-08-22 MED ORDER — BENAZEPRIL-HYDROCHLOROTHIAZIDE 20-25 MG PO TABS: ORAL_TABLET | ORAL | Status: DC

## 2014-08-22 NOTE — Telephone Encounter (Signed)
Pt wants to know if bp meds can be filled with 3 refills so that he does not have to call in each month for a refill.  Pt is schedule to see Dr. Yong Channel in June 2016 but will need bp meds filled until then.  Pharmacy: [facility].

## 2014-08-22 NOTE — Telephone Encounter (Signed)
rx sent in electronically 

## 2014-09-18 ENCOUNTER — Ambulatory Visit (INDEPENDENT_AMBULATORY_CARE_PROVIDER_SITE_OTHER): Payer: Medicare Other

## 2014-09-18 DIAGNOSIS — Z23 Encounter for immunization: Secondary | ICD-10-CM | POA: Diagnosis not present

## 2014-11-25 DIAGNOSIS — H01001 Unspecified blepharitis right upper eyelid: Secondary | ICD-10-CM | POA: Diagnosis not present

## 2014-11-25 DIAGNOSIS — H2513 Age-related nuclear cataract, bilateral: Secondary | ICD-10-CM | POA: Diagnosis not present

## 2014-11-25 DIAGNOSIS — H01004 Unspecified blepharitis left upper eyelid: Secondary | ICD-10-CM | POA: Diagnosis not present

## 2014-11-25 DIAGNOSIS — H40243 Residual stage of angle-closure glaucoma, bilateral: Secondary | ICD-10-CM | POA: Diagnosis not present

## 2015-02-10 ENCOUNTER — Encounter: Payer: Self-pay | Admitting: Gastroenterology

## 2015-02-24 ENCOUNTER — Telehealth: Payer: Self-pay

## 2015-02-24 MED ORDER — AMLODIPINE BESYLATE 10 MG PO TABS: 10.0000 mg | ORAL_TABLET | Freq: Every day | ORAL | Status: DC

## 2015-02-24 MED ORDER — BENAZEPRIL-HYDROCHLOROTHIAZIDE 20-25 MG PO TABS: ORAL_TABLET | ORAL | Status: DC

## 2015-02-24 NOTE — Telephone Encounter (Signed)
Medications refilled

## 2015-02-24 NOTE — Telephone Encounter (Signed)
Pt walked into the office requesting refills until his establish appt on 6.8.2016.    Pt would like amlodipine 10 mg qd (#90)sent to [facility].   Pt would like benazepril/hctz 20-25 qd (#90) sent to [facility].

## 2015-05-21 ENCOUNTER — Ambulatory Visit (INDEPENDENT_AMBULATORY_CARE_PROVIDER_SITE_OTHER): Payer: Medicare Other | Admitting: Family Medicine

## 2015-05-21 ENCOUNTER — Encounter: Payer: Medicare Other | Admitting: Family Medicine

## 2015-05-21 ENCOUNTER — Encounter: Payer: Self-pay | Admitting: Family Medicine

## 2015-05-21 VITALS — BP 144/74 | HR 62 | Temp 97.8°F | Ht 68.0 in | Wt 187.0 lb

## 2015-05-21 DIAGNOSIS — Z87891 Personal history of nicotine dependence: Secondary | ICD-10-CM

## 2015-05-21 DIAGNOSIS — E785 Hyperlipidemia, unspecified: Secondary | ICD-10-CM

## 2015-05-21 DIAGNOSIS — C4491 Basal cell carcinoma of skin, unspecified: Secondary | ICD-10-CM | POA: Diagnosis not present

## 2015-05-21 DIAGNOSIS — Z23 Encounter for immunization: Secondary | ICD-10-CM

## 2015-05-21 DIAGNOSIS — Z0000 Encounter for general adult medical examination without abnormal findings: Secondary | ICD-10-CM

## 2015-05-21 DIAGNOSIS — I10 Essential (primary) hypertension: Secondary | ICD-10-CM | POA: Diagnosis not present

## 2015-05-21 DIAGNOSIS — D126 Benign neoplasm of colon, unspecified: Secondary | ICD-10-CM

## 2015-05-21 LAB — POCT URINALYSIS DIPSTICK
BILIRUBIN UA: NEGATIVE
Blood, UA: NEGATIVE
GLUCOSE UA: NEGATIVE
KETONES UA: NEGATIVE
Leukocytes, UA: NEGATIVE
Nitrite, UA: NEGATIVE
Protein, UA: NEGATIVE
Spec Grav, UA: 1.015
UROBILINOGEN UA: 0.2
pH, UA: 7

## 2015-05-21 LAB — LIPID PANEL
Cholesterol: 198 mg/dL (ref 0–200)
HDL: 45.9 mg/dL (ref >39.00)
LDL CALC: 133 mg/dL — AB (ref 0–99)
NonHDL: 152.1
Total CHOL/HDL Ratio: 4
Triglycerides: 96 mg/dL (ref 0.0–149.0)
VLDL: 19.2 mg/dL (ref 0.0–40.0)

## 2015-05-21 LAB — CBC
HCT: 42.2 % (ref 39.0–52.0)
Hemoglobin: 14.1 g/dL (ref 13.0–17.0)
MCHC: 33.6 g/dL (ref 30.0–36.0)
MCV: 86.3 fl (ref 78.0–100.0)
PLATELETS: 256 10*3/uL (ref 150.0–400.0)
RBC: 4.89 Mil/uL (ref 4.22–5.81)
RDW: 13.8 % (ref 11.5–15.5)
WBC: 6.2 10*3/uL (ref 4.0–10.5)

## 2015-05-21 LAB — COMPREHENSIVE METABOLIC PANEL
ALK PHOS: 75 U/L (ref 39–117)
ALT: 16 U/L (ref 0–53)
AST: 19 U/L (ref 0–37)
Albumin: 4.3 g/dL (ref 3.5–5.2)
BUN: 9 mg/dL (ref 6–23)
CO2: 28 mEq/L (ref 19–32)
Calcium: 9.2 mg/dL (ref 8.4–10.5)
Chloride: 99 mEq/L (ref 96–112)
Creatinine, Ser: 0.84 mg/dL (ref 0.40–1.50)
GFR: 93.72 mL/min (ref >60.00)
Glucose, Bld: 113 mg/dL — ABNORMAL HIGH (ref 70–99)
POTASSIUM: 4.2 meq/L (ref 3.5–5.1)
SODIUM: 133 meq/L — AB (ref 135–145)
TOTAL PROTEIN: 7.4 g/dL (ref 6.0–8.3)
Total Bilirubin: 0.8 mg/dL (ref 0.2–1.2)

## 2015-05-21 LAB — TSH: TSH: 2.04 u[IU]/mL (ref 0.35–4.50)

## 2015-05-21 NOTE — Assessment & Plan Note (Signed)
S: lipids elevated last year about 20-30 points from prior levels.  A/P: still  No clear indication primary prevention at his age. Lipids have improved fortunately-wonder if patient was fasting last year.

## 2015-05-21 NOTE — Assessment & Plan Note (Signed)
Path report 02/2010- "1. COLON, POLYP(S), ASCENDING, TRANSVERSE, DESCENDING, RECTUM : TUBULAR ADENOMASAND A HYPERPLASTIC POLYP. NO HIGH GRADE DYSPLASIA OR MALIGNANCY IDENTIFIED."  5 year follow up was planned- refer back today. Patient's wife goes to Dr. Olevia Perches and requests her specifically. I think his overall health is excellent and still biking outdoors 60 minutes up to 3 days a week so likely would benefit from screening in 75-85 range still

## 2015-05-21 NOTE — Progress Notes (Signed)
Mitchell Reddish, MD Phone: [phone]  Subjective:  Patient presents today for their annual wellness visit.    Preventive Screening-Counseling & Management  Advanced directives: no prolonged life support but would want resuscitation HCPOA: wife  Smoking Status: former smoker Second Hand Smoking status: No smokers in home  Risk Factors Regular exercise: 2-3 x a week on bike 60 minutes Diet: reasonable  Fall Risk: None   Cardiac risk factors:  advanced age (older than 63 for men, 72 for women)  Hyperlipidemia - poor control, role of primary prevention questionable Lab Results  Component Value Date   CHOL 198 05/21/2015   HDL 45.90 05/21/2015   LDLCALC 133* 05/21/2015   LDLDIRECT 127.3 02/11/2010   TRIG 96.0 05/21/2015   CHOLHDL 4 05/21/2015  No diabetes.  Family History: dad with MI in 67s but sedentary   Depression Screen None. PHQ2 0   Activities of Daily Living Independent ADLs and IADLs   Hearing Difficulties: -patient endorses, advised [facility] hearing test  Cognitive Testing No reported trouble.   Normal 3 word recall  List the Names of Other Physician/Practitioners you currently use: 2. Optho Dr. Kathrin Penner last seen dec 2015  Immunization History  Administered Date(s) Administered  . Influenza Split 11/18/2011, 11/01/2012  . Influenza Whole 09/01/2010  . Influenza, High Dose Seasonal PF 09/18/2014  . Influenza,inj,Quad PF,36+ Mos 09/11/2013  . Pneumococcal Conjugate-13 05/21/2015  . Pneumococcal Polysaccharide-23 12/13/2004, 02/11/2010  . Td 12/14/2003  . Tetanus 05/20/2014  . Zoster 03/04/2010   Required Immunizations needed today  Prevnar 13 today  Screening tests- up to date  ROS- No pertinent positives discovered in course of AWV Hypertension ROS-Denies any CP, HA, SOB, blurry vision, LE edema  The following were reviewed and entered/updated in epic: Past Medical History  Diagnosis Date  . Hypertension   . Osteoarthritis     . Rosacea, acne   . EXTERNAL HEMORRHOIDS, THROMBOSED 08/12/2010  . ROTATOR CUFF TEAR 02/06/2008    Worked through with PT     Patient Active Problem List   Diagnosis Date Noted  . Hyperlipidemia 02/03/2009    Priority: Medium  . Essential hypertension 07/17/2007    Priority: Medium  . Former smoker 05/21/2015    Priority: Low  . Chronic frontal sinusitis 12/30/2010    Priority: Low  . ACNE ROSACEA 04/15/2010    Priority: Low  . Actinic keratosis 02/03/2009    Priority: Low  . Basal cell carcinoma of skin 02/29/2008    Priority: Low  . ERECTILE DYSFUNCTION 12/11/2007    Priority: Low  . Osteoarthritis 07/17/2007    Priority: Low   Past Surgical History  Procedure Laterality Date  . Glaucoma surgery    . Joint replacement      total knee replacement biltateral    Family History  Problem Relation Age of Onset  . Dementia Mother   . Heart attack Father     in his 56s, sedentary    Medications- reviewed and updated Current Outpatient Prescriptions  Medication Sig Dispense Refill  . amLODipine (NORVASC) 10 MG tablet Take 1 tablet (10 mg total) by mouth daily. 90 tablet 2  . benazepril-hydrochlorthiazide (LOTENSIN HCT) 20-25 MG per tablet TAKE ONE TABLET BY MOUTH ONCE DAILY 90 tablet 2   No current facility-administered medications for this visit.    Allergies-reviewed and updated Allergies  Allergen Reactions  . Celecoxib     REACTION: Upset GI  . Sulfonamide Derivatives     REACTION: rash    History  Social History  . Marital Status: Married    Spouse Name: N/A  . Number of Children: N/A  . Years of Education: N/A   Social History Main Topics  . Smoking status: Former Smoker -- 1.00 packs/day for 25 years    Types: Cigarettes    Quit date: 01/27/1974  . Smokeless tobacco: Not on file     Comment: quit smoking 40 years ago  . Alcohol Use: 4.2 oz/week    7 Standard drinks or equivalent per week  . Drug Use: No  . Sexual Activity: Yes   Other  Topics Concern  . 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 Maudie Mercury), 2 children, 4 grandkids      Retired from [employer] before then      Hobbies: exercise/biking, avid bass fisherman, yardwork    Objective: BP 144/74 mmHg  Pulse 62  Temp(Src) 97.8 F (36.6 C)  Ht 5\' 8"  (1.727 m)  Wt 187 lb (84.823 kg)  BMI 28.44 kg/m2 Gen: NAD, resting comfortably HEENT: Mucous membranes are moist. Oropharynx normal Neck: no thyromegaly CV: RRR no murmurs rubs or gallops Lungs: CTAB no crackles, wheeze, rhonchi Abdomen: soft/nontender/nondistended/normal bowel sounds. No rebound or guarding.  Ext: no edema Skin: warm, dry Neuro: grossly normal, moves all extremities, PERRLA  Assessment/Plan:  See AVS for personal care plan for AWV  Hyperlipidemia S: lipids elevated last year about 20-30 points from prior levels.  A/P: still  No clear indication primary prevention at his age. Lipids have improved fortunately-wonder if patient was fasting last year.    Essential hypertension S: controlled previously on Amlodipine, benazepril-hctz 20-25mg . Home checks in 130s. Mild poor control in office A/P: discussed home monitoring and return to care if regularly >140/90   Basal cell carcinoma of skin Advised to return to derm for full skin exam  Adenomatous colon polyp Path report 02/2010- "1. COLON, POLYP(S), ASCENDING, TRANSVERSE, DESCENDING, RECTUM : TUBULAR ADENOMASAND A HYPERPLASTIC POLYP. NO HIGH GRADE DYSPLASIA OR MALIGNANCY IDENTIFIED."  5 year follow up was planned- refer back today. Patient's wife goes to Dr. Olevia Perches and requests her specifically. I think his overall health is excellent and still biking outdoors 60 minutes up to 3 days a week so likely would benefit from screening in 75-85 range still  advised 6 month follow up BP, patient  states likely to return in 1 year but agrees to home BP monitoring and follow up if elevated  Orders Placed This Encounter  Procedures  . Pneumococcal conjugate vaccine 13-valent  . CBC    [place]  . Comprehensive metabolic panel    [place]    Order Specific Question:  Has the patient fasted?    Answer:  No  . Lipid panel    [place]    Order Specific Question:  Has the patient fasted?    Answer:  No  . TSH    [place]  . POCT urinalysis dipstick    In house

## 2015-05-21 NOTE — Assessment & Plan Note (Signed)
S: controlled previously on Amlodipine, benazepril-hctz 20-25mg . Home checks in 130s. Mild poor control in office A/P: discussed home monitoring and return to care if regularly >140/90

## 2015-05-21 NOTE — Patient Instructions (Addendum)
Mitchell Walker , Thank you for taking time to come for your Medicare Wellness Visit. I appreciate your ongoing commitment to your health goals. Please review the following plan we discussed and let me know if I can assist you in the future.   These are the goals we discussed: Continued regular exercise Go to [facility] for hearing test Drop off a copy of advanced directives at next visit  with skin cancer history, get reestablished with dermatology Get labs today, mychart for results  This is a list of the screening recommended for you and due dates: You are up to date as of today Health Maintenance  Topic Date Due  . Flu Shot  07/14/2015  . Colon Cancer Screening  02/25/2020  . Tetanus Vaccine  05/20/2024  . Shingles Vaccine  Completed  . Pneumonia vaccines  Completed  Received final pneumonia shot today ([id number]).

## 2015-05-21 NOTE — Assessment & Plan Note (Signed)
Advised to return to derm for full skin exam

## 2015-06-03 ENCOUNTER — Telehealth: Payer: Self-pay | Admitting: Gastroenterology

## 2015-06-03 NOTE — Telephone Encounter (Signed)
Dr. Fuller Plan do you approve of transfer?

## 2015-06-04 NOTE — Telephone Encounter (Signed)
Dr. Olevia Perches will you accept?

## 2015-06-04 NOTE — Telephone Encounter (Signed)
OK with me.

## 2015-06-04 NOTE — Telephone Encounter (Signed)
OK with me if DB is willing to.

## 2015-06-11 ENCOUNTER — Encounter: Payer: Self-pay | Admitting: Internal Medicine

## 2015-06-11 NOTE — Telephone Encounter (Signed)
Spoke to spouse and left message to call back & schedule Recall Colon.

## 2015-07-30 ENCOUNTER — Encounter: Payer: Federal, State, Local not specified - PPO | Admitting: Internal Medicine

## 2015-08-06 DIAGNOSIS — L570 Actinic keratosis: Secondary | ICD-10-CM | POA: Diagnosis not present

## 2015-08-06 DIAGNOSIS — L218 Other seborrheic dermatitis: Secondary | ICD-10-CM | POA: Diagnosis not present

## 2015-08-06 DIAGNOSIS — Z85828 Personal history of other malignant neoplasm of skin: Secondary | ICD-10-CM | POA: Diagnosis not present

## 2015-08-06 DIAGNOSIS — L821 Other seborrheic keratosis: Secondary | ICD-10-CM | POA: Diagnosis not present

## 2015-08-06 DIAGNOSIS — L718 Other rosacea: Secondary | ICD-10-CM | POA: Diagnosis not present

## 2015-10-28 ENCOUNTER — Other Ambulatory Visit: Payer: Self-pay | Admitting: Family Medicine

## 2015-10-28 ENCOUNTER — Ambulatory Visit (INDEPENDENT_AMBULATORY_CARE_PROVIDER_SITE_OTHER): Payer: Medicare Other

## 2015-10-28 DIAGNOSIS — Z23 Encounter for immunization: Secondary | ICD-10-CM

## 2015-11-03 ENCOUNTER — Encounter: Payer: Self-pay | Admitting: Gastroenterology

## 2015-11-19 ENCOUNTER — Ambulatory Visit: Payer: Federal, State, Local not specified - PPO | Admitting: Family Medicine

## 2015-12-11 DIAGNOSIS — H01004 Unspecified blepharitis left upper eyelid: Secondary | ICD-10-CM | POA: Diagnosis not present

## 2015-12-11 DIAGNOSIS — H01001 Unspecified blepharitis right upper eyelid: Secondary | ICD-10-CM | POA: Diagnosis not present

## 2015-12-11 DIAGNOSIS — H52203 Unspecified astigmatism, bilateral: Secondary | ICD-10-CM | POA: Diagnosis not present

## 2015-12-11 DIAGNOSIS — H02055 Trichiasis without entropian left lower eyelid: Secondary | ICD-10-CM | POA: Diagnosis not present

## 2015-12-31 DIAGNOSIS — L570 Actinic keratosis: Secondary | ICD-10-CM | POA: Diagnosis not present

## 2015-12-31 DIAGNOSIS — L814 Other melanin hyperpigmentation: Secondary | ICD-10-CM | POA: Diagnosis not present

## 2015-12-31 DIAGNOSIS — L821 Other seborrheic keratosis: Secondary | ICD-10-CM | POA: Diagnosis not present

## 2015-12-31 DIAGNOSIS — D2271 Melanocytic nevi of right lower limb, including hip: Secondary | ICD-10-CM | POA: Diagnosis not present

## 2015-12-31 DIAGNOSIS — D225 Melanocytic nevi of trunk: Secondary | ICD-10-CM | POA: Diagnosis not present

## 2015-12-31 DIAGNOSIS — Z85828 Personal history of other malignant neoplasm of skin: Secondary | ICD-10-CM | POA: Diagnosis not present

## 2016-04-02 DIAGNOSIS — H04222 Epiphora due to insufficient drainage, left lacrimal gland: Secondary | ICD-10-CM | POA: Diagnosis not present

## 2016-05-18 ENCOUNTER — Encounter: Payer: Self-pay | Admitting: Family Medicine

## 2016-05-18 ENCOUNTER — Ambulatory Visit (INDEPENDENT_AMBULATORY_CARE_PROVIDER_SITE_OTHER): Payer: Medicare Other | Admitting: Family Medicine

## 2016-05-18 ENCOUNTER — Telehealth: Payer: Self-pay | Admitting: Internal Medicine

## 2016-05-18 VITALS — BP 136/68 | HR 64 | Temp 97.8°F | Ht 66.0 in | Wt 191.0 lb

## 2016-05-18 DIAGNOSIS — D126 Benign neoplasm of colon, unspecified: Secondary | ICD-10-CM

## 2016-05-18 DIAGNOSIS — E785 Hyperlipidemia, unspecified: Secondary | ICD-10-CM

## 2016-05-18 DIAGNOSIS — Z0000 Encounter for general adult medical examination without abnormal findings: Secondary | ICD-10-CM

## 2016-05-18 DIAGNOSIS — I10 Essential (primary) hypertension: Secondary | ICD-10-CM

## 2016-05-18 LAB — COMPREHENSIVE METABOLIC PANEL
ALBUMIN: 4.4 g/dL (ref 3.5–5.2)
ALK PHOS: 79 U/L (ref 39–117)
ALT: 16 U/L (ref 0–53)
AST: 20 U/L (ref 0–37)
BILIRUBIN TOTAL: 1.1 mg/dL (ref 0.2–1.2)
BUN: 7 mg/dL (ref 6–23)
CALCIUM: 9.5 mg/dL (ref 8.4–10.5)
CO2: 32 mEq/L (ref 19–32)
Chloride: 94 mEq/L — ABNORMAL LOW (ref 96–112)
Creatinine, Ser: 0.86 mg/dL (ref 0.40–1.50)
GFR: 90.98 mL/min (ref >60.00)
GLUCOSE: 115 mg/dL — AB (ref 70–99)
POTASSIUM: 4.4 meq/L (ref 3.5–5.1)
Sodium: 132 mEq/L — ABNORMAL LOW (ref 135–145)
TOTAL PROTEIN: 7.4 g/dL (ref 6.0–8.3)

## 2016-05-18 LAB — CBC
HEMATOCRIT: 41.7 % (ref 39.0–52.0)
HEMOGLOBIN: 14.2 g/dL (ref 13.0–17.0)
MCHC: 34 g/dL (ref 30.0–36.0)
MCV: 86.6 fl (ref 78.0–100.0)
PLATELETS: 248 10*3/uL (ref 150.0–400.0)
RBC: 4.82 Mil/uL (ref 4.22–5.81)
RDW: 13.5 % (ref 11.5–15.5)
WBC: 6.4 10*3/uL (ref 4.0–10.5)

## 2016-05-18 LAB — POC URINALSYSI DIPSTICK (AUTOMATED)
BILIRUBIN UA: NEGATIVE
Blood, UA: NEGATIVE
GLUCOSE UA: NEGATIVE
KETONES UA: NEGATIVE
LEUKOCYTES UA: NEGATIVE
Nitrite, UA: NEGATIVE
PROTEIN UA: NEGATIVE
Spec Grav, UA: 1.01
Urobilinogen, UA: 0.2
pH, UA: 7.5

## 2016-05-18 LAB — LIPID PANEL
CHOLESTEROL: 209 mg/dL — AB (ref 0–200)
HDL: 46.9 mg/dL (ref >39.00)
LDL Cholesterol: 138 mg/dL — ABNORMAL HIGH (ref 0–99)
NonHDL: 162.56
TRIGLYCERIDES: 122 mg/dL (ref 0.0–149.0)
Total CHOL/HDL Ratio: 4
VLDL: 24.4 mg/dL (ref 0.0–40.0)

## 2016-05-18 MED ORDER — AMLODIPINE BESYLATE 10 MG PO TABS
10.0000 mg | ORAL_TABLET | Freq: Every day | ORAL | Status: DC
Start: 2016-05-18 — End: 2016-05-18

## 2016-05-18 MED ORDER — AMLODIPINE BESYLATE 10 MG PO TABS: 10.0000 mg | ORAL_TABLET | Freq: Every day | ORAL | Status: DC

## 2016-05-18 MED ORDER — BENAZEPRIL-HYDROCHLOROTHIAZIDE 20-25 MG PO TABS: 1.0000 | ORAL_TABLET | Freq: Every day | ORAL | Status: DC

## 2016-05-18 NOTE — Assessment & Plan Note (Signed)
S: controlled on repeat. On amlodipine 10mg  and benazepril-hctz 20-25mg .  Blood pressures at home running high 130s or low 140s.  BP Readings from Last 3 Encounters:  05/18/16 136/68  05/21/15 144/74  05/20/14 130/72  A/P:Continue current meds:  Doing well

## 2016-05-18 NOTE — Patient Instructions (Addendum)
  Mr. Mitchell Walker , Thank you for taking time to come for your Medicare Wellness Visit. I appreciate your ongoing commitment to your health goals. Please review the following plan we discussed and let me know if I can assist you in the future.   These are the goals we discussed: 1. Try to trim those extra 3-4 lbs back off 2. Keep up great job exercising!  3. Labs before you go   This is a list of the screening recommended for you and due dates:  Health Maintenance  Topic Date Due  . Flu Shot  07/13/2016  . Tetanus Vaccine  05/20/2024  . Shingles Vaccine  Completed  . Pneumonia vaccines  Completed   Meds ordered this encounter  Medications  . amLODipine (NORVASC) 10 MG tablet    Sig: Take 1 tablet (10 mg total) by mouth daily.    Dispense:  91 tablet    Refill:  3  . benazepril-hydrochlorthiazide (LOTENSIN HCT) 20-25 MG tablet    Sig: Take 1 tablet by mouth daily.    Dispense:  91 tablet    Refill:  3

## 2016-05-18 NOTE — Telephone Encounter (Signed)
Dr. Olevia Perches patient  Referral in epic for colonoscopy due to Adenomatous colon polyp on last.  Since patient will be turning 38 in August, can you advise on Recall COLON or OV.

## 2016-05-18 NOTE — Assessment & Plan Note (Signed)
S: poorly controlled on no medicine. No myalgias.  Lab Results  Component Value Date   CHOL 198 05/21/2015   HDL 45.90 05/21/2015   LDLCALC 133* 05/21/2015   LDLDIRECT 127.3 02/11/2010   TRIG 96.0 05/21/2015   CHOLHDL 4 05/21/2015   A/P: no clear benefit of primary prevention at his age compared to risk, update lipids.

## 2016-05-18 NOTE — Telephone Encounter (Signed)
Will need OV first.

## 2016-05-18 NOTE — Progress Notes (Signed)
Phone: [phone]  Subjective:  Patient presents today for their annual wellness visit.    Preventive Screening-Counseling & Management  Smoking Status: former Smoker Second Hand Smoking status: No smokers in home  Risk Factors Regular exercise: 2-3x a week on bike for 60 minutes Diet: reasonable but weight up 4 lbs, going to work on limiting sweets Wt Readings from Last 3 Encounters:  05/18/16 191 lb (86.637 kg)  05/21/15 187 lb (84.823 kg)  05/20/14 188 lb (85.276 kg)  Fall Risk: None  Fall Risk  05/18/2016 05/21/2015 05/20/2014 05/20/2014  Falls in the past year? No No No No   Cardiac risk factors:  advanced age (older than 55 for men, 78 for women)  Hyperlipidemia yes but see problem oriented charting below No diabetes.  Family History: dad with MI in 50s but sedentary HTN_ yes but see problem oriented charting   Depression Screen None. PHQ2 0  Depression screen [facility] 2/9 05/18/2016 05/21/2015 05/20/2014 05/20/2014 05/10/2012  Decreased Interest 0 0 1 0 1  Down, Depressed, Hopeless 0 0 0 0 1  PHQ - 2 Score 0 0 1 0 2    Activities of Daily Living Independent ADLs and IADLs   Hearing Difficulties: -patient endorses, advised [facility] hearing test last year. Advised again this year.   Cognitive Testing No reported trouble.   Normal 3 word recall  List the Names of Other Physician/Practitioners you currently use: -Dr. Kathrin Penner optho -[facility] -reestablishing with GI  Advanced directives: no prolonged life support but would want resuscitation HCPOA: wife  Immunization History  Administered Date(s) Administered  . Influenza Split 11/18/2011, 11/01/2012  . Influenza Whole 09/01/2010  . Influenza, High Dose Seasonal PF 09/18/2014, 10/28/2015  . Influenza,inj,Quad PF,36+ Mos 09/11/2013  . Pneumococcal Conjugate-13 05/21/2015  . Pneumococcal Polysaccharide-23 12/13/2004, 02/11/2010  . Td 12/14/2003  . Tetanus 05/20/2014  . Zoster 03/04/2010   Required  Immunizations needed today: none  Screening tests- up to date except for colonoscopy  ROS- No pertinent positives discovered in course of AWV Pertinent for problem oriented ros- No chest pain or shortness of breath. No headache or blurry vision.   The following were reviewed and entered/updated in epic: Past Medical History  Diagnosis Date  . Hypertension   . Osteoarthritis   . Rosacea, acne   . EXTERNAL HEMORRHOIDS, THROMBOSED 08/12/2010  . ROTATOR CUFF TEAR 02/06/2008    Worked through with PT     Patient Active Problem List   Diagnosis Date Noted  . Hyperlipidemia 02/03/2009    Priority: Medium  . Essential hypertension 07/17/2007    Priority: Medium  . Former smoker 05/21/2015    Priority: Low  . Adenomatous colon polyp 05/21/2015    Priority: Low  . Chronic frontal sinusitis 12/30/2010    Priority: Low  . ACNE ROSACEA 04/15/2010    Priority: Low  . Actinic keratosis 02/03/2009    Priority: Low  . Basal cell carcinoma of skin 02/29/2008    Priority: Low  . ERECTILE DYSFUNCTION 12/11/2007    Priority: Low  . Osteoarthritis 07/17/2007    Priority: Low   Past Surgical History  Procedure Laterality Date  . Glaucoma surgery    . Joint replacement      total knee replacement biltateral    Family History  Problem Relation Age of Onset  . Dementia Mother   . Heart attack Father     in his 37s, sedentary    Medications- reviewed and updated Current Outpatient Prescriptions  Medication Sig Dispense Refill  .  amLODipine (NORVASC) 10 MG tablet TAKE ONE TABLET BY MOUTH DAILY. 90 tablet 2  . benazepril-hydrochlorthiazide (LOTENSIN HCT) 20-25 MG tablet TAKE 1 TABLET ONCE DAILY 90 tablet 2   Allergies-reviewed and updated Allergies  Allergen Reactions  . Celecoxib     REACTION: Upset GI  . Sulfonamide Derivatives     REACTION: rash    Social History   Social History  . Marital Status: Married    Spouse Name: N/A  . Number of Children: N/A  . Years of  Education: N/A   Social History Main Topics  . Smoking status: Former Smoker -- 1.00 packs/day for 25 years    Types: Cigarettes    Quit date: 01/27/1974  . Smokeless tobacco: None     Comment: quit smoking 40 years ago  . Alcohol Use: 4.2 oz/week    7 Standard drinks or equivalent per week  . Drug Use: No  . Sexual Activity: Yes   Other Topics Concern  . 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 Maudie Mercury), 2 children, 4 grandkids      Retired from [employer] and [employer] before then      Hobbies: exercise/biking, avid bass fisherman, yardwork    Objective: BP 136/68 mmHg  Pulse 64  Temp(Src) 97.8 F (36.6 C) (Oral)  Ht 5\' 6"  (1.676 m)  Wt 191 lb (86.637 kg)  BMI 30.84 kg/m2 Gen: NAD, resting comfortably HEENT: Mucous membranes are moist. Oropharynx normal CV: RRR no murmurs rubs or gallops Lungs: CTAB no crackles, wheeze, rhonchi Abdomen: soft/nontender/nondistended/normal bowel sounds. No rebound or guarding.  Ext: no edema Skin: warm, dry, no rash Neuro: grossly normal, moves all extremities, PERRLA  Assessment/Plan:  AWV completed- discussed recommended screenings anddocumented any personalized health advice and referrals for preventive counseling. See AVS as well which was given to patient.   Status of chronic or acute concerns   History of adenoma- in 2011. Refer back for colonoscopy History of basal cell carcinoma of skin- 8 months ago [place] [facility]  Essential hypertension S: controlled on repeat. On amlodipine 10mg  and benazepril-hctz 20-25mg .  Blood pressures at home running high 130s or low 140s.  BP Readings from Last 3 Encounters:  05/18/16 136/68  05/21/15 144/74  05/20/14 130/72  A/P:Continue current meds:  Doing well   Hyperlipidemia S: poorly controlled on no medicine. No myalgias.  Lab Results  Component  Value Date   CHOL 198 05/21/2015   HDL 45.90 05/21/2015   LDLCALC 133* 05/21/2015   LDLDIRECT 127.3 02/11/2010   TRIG 96.0 05/21/2015   CHOLHDL 4 05/21/2015   A/P: no clear benefit of primary prevention at his age compared to risk, update lipids.    Return in about 1 year (around 05/18/2017) for annual wellness visit. Return precautions advised.   Orders Placed This Encounter  Procedures  . CBC    [place]  . Comprehensive metabolic panel    [place]    Order Specific Question:  Has the patient fasted?    Answer:  No  . Lipid panel    [place]    Order Specific Question:  Has the patient fasted?    Answer:  No  . Ambulatory referral to Gastroenterology    Referral Priority:  Routine    Referral Type:  Consultation    Referral Reason:  Specialty Services Required    Number  of Visits Requested:  1  . POCT Urinalysis Dipstick (Automated)    Meds ordered this encounter  Medications  . amLODipine (NORVASC) 10 MG tablet    Sig: Take 1 tablet (10 mg total) by mouth daily.    Dispense:  91 tablet    Refill:  3  . benazepril-hydrochlorthiazide (LOTENSIN HCT) 20-25 MG tablet    Sig: Take 1 tablet by mouth daily.    Dispense:  91 tablet    Refill:  3    Garret Reddish, MD

## 2016-05-18 NOTE — Addendum Note (Signed)
Addended by: Jerl Santos R on: 05/18/2016 09:17 AM   Modules accepted: Orders

## 2016-05-21 NOTE — Telephone Encounter (Signed)
Per Barbera Setters, schedule this patient with an APP. Left message for patient to return my call.

## 2016-07-28 ENCOUNTER — Encounter: Payer: Self-pay | Admitting: Family Medicine

## 2016-08-04 DIAGNOSIS — D224 Melanocytic nevi of scalp and neck: Secondary | ICD-10-CM | POA: Diagnosis not present

## 2016-08-04 DIAGNOSIS — L821 Other seborrheic keratosis: Secondary | ICD-10-CM | POA: Diagnosis not present

## 2016-08-04 DIAGNOSIS — D225 Melanocytic nevi of trunk: Secondary | ICD-10-CM | POA: Diagnosis not present

## 2016-08-04 DIAGNOSIS — L718 Other rosacea: Secondary | ICD-10-CM | POA: Diagnosis not present

## 2016-08-04 DIAGNOSIS — L570 Actinic keratosis: Secondary | ICD-10-CM | POA: Diagnosis not present

## 2016-08-04 DIAGNOSIS — Z85828 Personal history of other malignant neoplasm of skin: Secondary | ICD-10-CM | POA: Diagnosis not present

## 2016-08-04 DIAGNOSIS — L814 Other melanin hyperpigmentation: Secondary | ICD-10-CM | POA: Diagnosis not present

## 2016-08-17 ENCOUNTER — Telehealth: Payer: Self-pay | Admitting: Family Medicine

## 2016-08-17 NOTE — Telephone Encounter (Signed)
error 

## 2016-10-12 ENCOUNTER — Ambulatory Visit (INDEPENDENT_AMBULATORY_CARE_PROVIDER_SITE_OTHER): Payer: Medicare Other | Admitting: Family Medicine

## 2016-10-12 DIAGNOSIS — Z23 Encounter for immunization: Secondary | ICD-10-CM

## 2016-10-22 ENCOUNTER — Ambulatory Visit (INDEPENDENT_AMBULATORY_CARE_PROVIDER_SITE_OTHER): Payer: Medicare Other | Admitting: Family Medicine

## 2016-10-22 ENCOUNTER — Encounter: Payer: Self-pay | Admitting: Family Medicine

## 2016-10-22 VITALS — BP 130/72 | HR 72 | Temp 97.6°F | Wt 195.0 lb

## 2016-10-22 DIAGNOSIS — Z114 Encounter for screening for human immunodeficiency virus [HIV]: Secondary | ICD-10-CM | POA: Diagnosis not present

## 2016-10-22 DIAGNOSIS — R413 Other amnesia: Secondary | ICD-10-CM

## 2016-10-22 DIAGNOSIS — G3184 Mild cognitive impairment, so stated: Secondary | ICD-10-CM | POA: Insufficient documentation

## 2016-10-22 LAB — BASIC METABOLIC PANEL
BUN: 10 mg/dL (ref 7–25)
CO2: 26 mmol/L (ref 20–31)
Calcium: 8.9 mg/dL (ref 8.6–10.3)
Chloride: 98 mmol/L (ref 98–110)
Creat: 0.81 mg/dL (ref 0.70–1.11)
Glucose, Bld: 112 mg/dL — ABNORMAL HIGH (ref 65–99)
POTASSIUM: 3.8 mmol/L (ref 3.5–5.3)
Sodium: 135 mmol/L (ref 135–146)

## 2016-10-22 LAB — TSH: TSH: 2.34 m[IU]/L (ref 0.40–4.50)

## 2016-10-22 NOTE — Assessment & Plan Note (Signed)
S:Memory issues reported from wife and kids for about 6 months. Repeating himself fair amount, forgetting appointments, forgetting where doctors offices are. Mild hearing loss but does not have hearing aids.   PHQ9 of 1, for fatigue. Recent CBC normal, LFTs normal.  A/P: MMSE scores 28/30 (loses one for serial 7s and 1 for delayed recall). Reassuring neuro exam.  If hearing aids become more affordable may consider for hearing. Has had some low sodium- ? Related to HCTZ- but doubt that is causing memory loss. Will also check-  BMET, RPR, HIV, TSH, B12. Follow up 6 months- if progressive consider MRI .

## 2016-10-22 NOTE — Addendum Note (Signed)
Addended by: Gari Crown D on: 10/22/2016 04:04 PM   Modules accepted: Orders

## 2016-10-22 NOTE — Progress Notes (Signed)
Subjective:  Mitchell Walker is a 82 y.o. year old very pleasant male patient who presents for/with See problem oriented charting ROS- No facial or extremity weakness. No slurred words or trouble swallowing. no blurry vision or double vision. No paresthesias. Mild confusion at times. no word finding difficulties. Marland Kitchensee any ROS included in HPI as well.   Past Medical History-  Patient Active Problem List   Diagnosis Date Noted  . Memory loss 10/22/2016    Priority: Medium  . Hyperlipidemia 02/03/2009    Priority: Medium  . Essential hypertension 07/17/2007    Priority: Medium  . Former smoker 05/21/2015    Priority: Low  . Adenomatous colon polyp 05/21/2015    Priority: Low  . Chronic frontal sinusitis 12/30/2010    Priority: Low  . ACNE ROSACEA 04/15/2010    Priority: Low  . Actinic keratosis 02/03/2009    Priority: Low  . Basal cell carcinoma of skin 02/29/2008    Priority: Low  . ERECTILE DYSFUNCTION 12/11/2007    Priority: Low  . Osteoarthritis 07/17/2007    Priority: Low    Medications- reviewed and updated Current Outpatient Prescriptions  Medication Sig Dispense Refill  . amLODipine (NORVASC) 10 MG tablet Take 1 tablet (10 mg total) by mouth daily. 91 tablet 3  . benazepril-hydrochlorthiazide (LOTENSIN HCT) 20-25 MG tablet Take 1 tablet by mouth daily. 91 tablet 3   No current facility-administered medications for this visit.     Objective: BP 130/72 (BP Location: Left Arm, Patient Position: Sitting, Cuff Size: Large)   Pulse 72   Temp 97.6 F (36.4 C) (Oral)   Wt 195 lb (88.5 kg)   SpO2 93%   BMI 31.47 kg/m  Gen: NAD, resting comfortably CV: RRR no murmurs rubs or gallops Lungs: CTAB no crackles, wheeze, rhonchi  Ext: no edema Neuro: CN II-XII intact, sensation and reflexes normal throughout, 5/5 muscle strength in bilateral upper and lower extremities. Normal finger to nose. Normal rapid alternating movements. No pronator drift. Normal romberg. Normal gait.    Assessment/Plan:  Memory loss S:Memory issues reported from wife and kids for about 6 months. Repeating himself fair amount, forgetting appointments, forgetting where doctors offices are. Mild hearing loss but does not have hearing aids.   PHQ9 of 1, for fatigue. Recent CBC normal, LFTs normal.  A/P: MMSE scores 28/30 (loses one for serial 7s and 1 for delayed recall). Reassuring neuro exam.  If hearing aids become more affordable may consider for hearing. Has had some low sodium- ? Related to HCTZ- but doubt that is causing memory loss. Will also check-  BMET, RPR, HIV, TSH, B12. Follow up 6 months- if progressive consider MRI .   6 month repeat MMSE  Orders Placed This Encounter  Procedures  . Basic metabolic panel    [place]  . HIV antibody  . RPR    [facility]  . TSH    [place]  . Vitamin B12   Return precautions advised.  Garret Reddish, MD

## 2016-10-22 NOTE — Patient Instructions (Signed)
Labs before you leave  Return in 6 months for repeat memory testing.   Your memory test was not perfect but it did not show level of decrease to diagnose dementia. We will follow this closely. If worsening- consider MRI of brain and potential medication. May very well just stay stable though- only time will tell.

## 2016-10-22 NOTE — Addendum Note (Signed)
Addended by: Gari Crown D on: 10/22/2016 04:03 PM   Modules accepted: Orders

## 2016-10-22 NOTE — Progress Notes (Signed)
Pre visit review using our clinic review tool, if applicable. No additional management support is needed unless otherwise documented below in the visit note. 

## 2016-10-23 LAB — HIV ANTIBODY (ROUTINE TESTING W REFLEX): HIV 1&2 Ab, 4th Generation: NONREACTIVE

## 2016-10-23 LAB — RPR

## 2016-10-23 LAB — VITAMIN B12: Vitamin B-12: 422 pg/mL (ref 200–1100)

## 2016-12-15 DIAGNOSIS — H5203 Hypermetropia, bilateral: Secondary | ICD-10-CM | POA: Diagnosis not present

## 2016-12-15 DIAGNOSIS — H01001 Unspecified blepharitis right upper eyelid: Secondary | ICD-10-CM | POA: Diagnosis not present

## 2016-12-15 DIAGNOSIS — H40033 Anatomical narrow angle, bilateral: Secondary | ICD-10-CM | POA: Diagnosis not present

## 2016-12-15 DIAGNOSIS — H25813 Combined forms of age-related cataract, bilateral: Secondary | ICD-10-CM | POA: Diagnosis not present

## 2017-01-11 ENCOUNTER — Encounter: Payer: Self-pay | Admitting: Family Medicine

## 2017-01-11 ENCOUNTER — Ambulatory Visit (INDEPENDENT_AMBULATORY_CARE_PROVIDER_SITE_OTHER): Payer: Medicare Other | Admitting: Family Medicine

## 2017-01-11 DIAGNOSIS — R413 Other amnesia: Secondary | ICD-10-CM

## 2017-01-11 NOTE — Progress Notes (Signed)
Pre visit review using our clinic review tool, if applicable. No additional management support is needed unless otherwise documented below in the visit note. 

## 2017-01-11 NOTE — Patient Instructions (Addendum)
No changes in memory on test today but that does not mean there has not been any changes at home  We discussed doing an MRI, seeing neurology, or considering medicine for memory loss. Given stability of symptoms- opted out for now. Let me know if you change your mind.

## 2017-01-11 NOTE — Assessment & Plan Note (Signed)
S: last saw patient about 3 months ago on 10/22/16. Had memory issues reported by family for about 6 months at that time. Wife wanted patient to be seen because concerned steadily worsening - she feels like she has to lay the route out to get places and then remind him during the trip. He may forget what he had for lunch and forgets events. Reversible cause evaluation including labs, phq9 and neurological exam last visit were not revealing for cause. We did not complete MRI- did not seem step wise and doubted vascular dementia.   A/P: MMSE scores 29/30 today (loses 1 for 3 word delayed recall) compared to prior 28/30 (loses one for serial 7s and 1 for delayed recall). We did not discuss if he had hearing evaluated as planned.  Counseling on 3 following issues 1. Decision to complete workup for reversible causes with MRI- declines for now 2. Referral to neurology- decline for now- ask about potential of seeing Dr. Posey Pronto if needed 3. Consideration of starting aricept for potential mild cognitive impairment but discussed data not great- they declined for now.  They want to follow up in about 3 months to reevaluate . Check in on hearing aid.

## 2017-01-11 NOTE — Progress Notes (Signed)
Subjective:  Mitchell Walker is a 83 y.o. year old very pleasant male patient who presents for/with See problem oriented charting ROS- no headache, blurry vision, chest pain, shortness of breath   Past Medical History-  Patient Active Problem List   Diagnosis Date Noted  . Memory loss 10/22/2016    Priority: Medium  . Hyperlipidemia 02/03/2009    Priority: Medium  . Essential hypertension 07/17/2007    Priority: Medium  . Former smoker 05/21/2015    Priority: Low  . Adenomatous colon polyp 05/21/2015    Priority: Low  . Chronic frontal sinusitis 12/30/2010    Priority: Low  . ACNE ROSACEA 04/15/2010    Priority: Low  . Actinic keratosis 02/03/2009    Priority: Low  . Basal cell carcinoma of skin 02/29/2008    Priority: Low  . ERECTILE DYSFUNCTION 12/11/2007    Priority: Low  . Osteoarthritis 07/17/2007    Priority: Low    Medications- reviewed and updated Current Outpatient Prescriptions  Medication Sig Dispense Refill  . amLODipine (NORVASC) 10 MG tablet Take 1 tablet (10 mg total) by mouth daily. 91 tablet 3  . benazepril-hydrochlorthiazide (LOTENSIN HCT) 20-25 MG tablet Take 1 tablet by mouth daily. 91 tablet 3  . Omega-3 Fatty Acids (FISH OIL) 1000 MG CAPS Take 1 capsule by mouth daily.     No current facility-administered medications for this visit.     Objective: BP 132/62 (BP Location: Left Arm, Patient Position: Sitting, Cuff Size: Large)   Pulse 72   Temp 97.8 F (36.6 C) (Oral)   Ht 5\' 6"  (1.676 m)   Wt 194 lb 6.4 oz (88.2 kg)   SpO2 95%   BMI 31.38 kg/m  Gen: NAD, resting comfortably  Assessment/Plan:  Memory loss S: last saw patient about 3 months ago on 10/22/16. Had memory issues reported by family for about 6 months at that time. Wife wanted patient to be seen because concerned steadily worsening - she feels like she has to lay the route out to get places and then remind him during the trip. He may forget what he had for lunch and forgets events.  Reversible cause evaluation including labs, phq9 and neurological exam last visit were not revealing for cause. We did not complete MRI- did not seem step wise and doubted vascular dementia.   A/P: MMSE scores 29/30 today (loses 1 for 3 word delayed recall) compared to prior 28/30 (loses one for serial 7s and 1 for delayed recall). We did not discuss if he had hearing evaluated as planned.  Counseling on 3 following issues 1. Decision to complete workup for reversible causes with MRI- declines for now 2. Referral to neurology- decline for now- ask about potential of seeing Dr. Posey Pronto if needed 3. Consideration of starting aricept for potential mild cognitive impairment but discussed data not great- they declined for now.  They want to follow up in about 3 months to reevaluate . Check in on hearing aid.   Meds ordered this encounter  Medications  . Omega-3 Fatty Acids (FISH OIL) 1000 MG CAPS    Sig: Take 1 capsule by mouth daily.   The duration of face-to-face time during this visit was greater than 15 minutes. Greater than 50% of this time was spent in counseling about follow up options see 1-3 above  Return precautions advised.  Garret Reddish, MD

## 2017-01-12 ENCOUNTER — Encounter: Payer: Self-pay | Admitting: Family Medicine

## 2017-02-24 ENCOUNTER — Telehealth: Payer: Self-pay

## 2017-02-24 NOTE — Telephone Encounter (Signed)
Call to Mr. Kimi regarding AWV p seeing wife today  States he can stay on June 8th after his 8:15 apt with dr. Yong Channel for AWV and was placed on my schedule

## 2017-03-18 ENCOUNTER — Other Ambulatory Visit: Payer: Self-pay

## 2017-03-18 ENCOUNTER — Telehealth: Payer: Self-pay | Admitting: Family Medicine

## 2017-03-18 DIAGNOSIS — R413 Other amnesia: Secondary | ICD-10-CM

## 2017-03-18 NOTE — Telephone Encounter (Signed)
Pts wife calling and would like to have a referral to Dr. Narda Amber at [facility] for the pt they have called with her and she asked that they have a referral placed.

## 2017-03-18 NOTE — Telephone Encounter (Signed)
A referral has been placed and patient is aware.

## 2017-03-21 ENCOUNTER — Encounter: Payer: Self-pay | Admitting: Neurology

## 2017-04-11 ENCOUNTER — Encounter: Payer: Self-pay | Admitting: Neurology

## 2017-04-11 ENCOUNTER — Ambulatory Visit (INDEPENDENT_AMBULATORY_CARE_PROVIDER_SITE_OTHER): Payer: Medicare Other | Admitting: Neurology

## 2017-04-11 ENCOUNTER — Other Ambulatory Visit: Payer: Medicare Other

## 2017-04-11 VITALS — BP 148/88 | HR 72 | Ht 66.0 in | Wt 197.1 lb

## 2017-04-11 DIAGNOSIS — Z7289 Other problems related to lifestyle: Secondary | ICD-10-CM

## 2017-04-11 DIAGNOSIS — G3184 Mild cognitive impairment, so stated: Secondary | ICD-10-CM

## 2017-04-11 DIAGNOSIS — Z789 Other specified health status: Secondary | ICD-10-CM | POA: Diagnosis not present

## 2017-04-11 NOTE — Progress Notes (Signed)
[facility] Clinic Note - Initial Visit   Date: 04/11/17  Mitchell Walker MRN: [id number] DOB: 02/14/1934   Dear Dr. Yong Channel:  Thank you for your kind referral of Mitchell Walker for consultation of memory change. Although his history is well known to you, please allow [place] to reiterate it for the purpose of our medical record. The patient was accompanied to the clinic by wife who also provides collateral information.     History of Present Illness: Mitchell Walker is a 83 y.o. right-handed Caucasian male with hypertension presenting for evaluation of memory changes.    Starting around 6 months ago, his wife began noticing that when he drives, he forgets which roads to turn on and she thinks he may be distracted.  He forgets appointments and always repeats confirming the details. She often has to tell him the same information, or he quickly forgets; for instance, when driving, she will tell him to turn on a specific street, but he would forget this unless, she keeps reminding him or tells him with to turn.   He rarely misplaces objects.  He denies any problems with managing finances or medications.  He has some difficulty with word-finding, but not often.  He gets turned around when driving, and uses a GPS as needed. His wife stays that he drives too fast and needs to slow down, but she denies any safety concerns.  He denies difficulty recognizing faces, but has problems with recalling names.    He drinks 2-3 beers nightly for the past 50 years. He denies paresthesias of the feet.  He stays very active socially and started riding a bike lately with friends.   Out-side paper records, electronic medical record, and images have been reviewed where available and summarized as:  Labs 10/22/2016:  Vitamin B12 422, RPR neg, TSH 2.34, HIV NR   Past Medical History:  Diagnosis Date  . EXTERNAL HEMORRHOIDS, THROMBOSED 08/12/2010  . Hypertension   . Osteoarthritis   . Rosacea, acne    . ROTATOR CUFF TEAR 02/06/2008   Worked through with PT      Past Surgical History:  Procedure Laterality Date  . GLAUCOMA SURGERY    . JOINT REPLACEMENT     total knee replacement biltateral     Medications:  Outpatient Encounter Prescriptions as of 04/11/2017  Medication Sig  . amLODipine (NORVASC) 10 MG tablet Take 1 tablet (10 mg total) by mouth daily.  . benazepril-hydrochlorthiazide (LOTENSIN HCT) 20-25 MG tablet Take 1 tablet by mouth daily.  . Omega-3 Fatty Acids (FISH OIL) 1000 MG CAPS Take 1 capsule by mouth daily.   No facility-administered encounter medications on file as of 04/11/2017.      Allergies:  Allergies  Allergen Reactions  . Celecoxib     REACTION: Upset GI  . Sulfonamide Derivatives     REACTION: rash    Family History: Family History  Problem Relation Age of Onset  . Dementia Mother   . Heart attack Father     in his 71s, sedentary  . Osteoporosis Sister     Social History: Social History  Substance Use Topics  . Smoking status: Former Smoker    Packs/day: 1.00    Years: 25.00    Types: Cigarettes    Quit date: 01/27/1974  . Smokeless tobacco: Never Used     Comment: quit smoking 40 years ago  . Alcohol use 4.2 oz/week    7 Standard drinks or equivalent per week  Comment: 2-3 beers nightly   Social History   Social History Narrative   Designated Party Release signed on 04/29/10      Married (58 years in 2016-wife patient of Dr. Maudie Mercury), 2 children, 4 grandkids      Retired from [employer] of [employer] and [employer] before then      Hobbies: exercise/biking, avid bass fisherman, yardwork      Highest level of education:  High school    Review of Systems:  CONSTITUTIONAL: No fevers, chills, night sweats, or weight loss.   EYES: No visual changes or eye pain ENT: No hearing changes.  No history of nose bleeds.   RESPIRATORY: No cough, wheezing and shortness of breath.   CARDIOVASCULAR:  Negative for chest pain, and palpitations.   GI: Negative for abdominal discomfort, blood in stools or black stools.  No recent change in bowel habits.   GU:  No history of incontinence.   MUSCLOSKELETAL: No history of joint pain or swelling.  No myalgias.   SKIN: Negative for lesions, rash, and itching.   HEMATOLOGY/ONCOLOGY: Negative for prolonged bleeding, bruising easily, and swollen nodes.  No history of cancer.   ENDOCRINE: Negative for cold or heat intolerance, polydipsia or goiter.   PSYCH:  No depression or anxiety symptoms.   NEURO: As Above.   Vital Signs:  BP (!) 148/88   Pulse 72   Ht 5\' 6"  (1.676 m)   Wt 197 lb 2 oz (89.4 kg)   SpO2 93%   BMI 31.82 kg/m    General Medical Exam:   General:  Well appearing, comfortable.   Eyes/ENT: see cranial nerve examination.   Neck: No masses appreciated.  Full range of motion without tenderness.  No carotid bruits. Respiratory:  Clear to auscultation, good air entry bilaterally.   Cardiac:  Regular rate and rhythm, no murmur.   Extremities:  No deformities, edema, or skin discoloration.  Skin:  No rashes or lesions.  Neurological Exam: MENTAL STATUS including orientation to time, place, person, recent and remote memory, attention span and concentration, language, and fund of knowledge is mostly intact.  Speech is not dysarthric. Montreal Cognitive Assessment  04/11/2017  Visuospatial/ Executive (0/5) 4  Naming (0/3) 3  Attention: Read list of digits (0/2) 2  Attention: Read list of letters (0/1) 1  Attention: Serial 7 subtraction starting at 100 (0/3) 3  Language: Repeat phrase (0/2) 2  Language : Fluency (0/1) 1  Abstraction (0/2) 1  Delayed Recall (0/5) 0  Orientation (0/6) 6  Total 23  Adjusted Score (based on education) 24    CRANIAL NERVES: II:  No visual field defects.  Unremarkable fundi.   III-IV-VI: Pupils equal round and reactive to light.  Normal conjugate, extra-ocular eye movements in all directions of  gaze.  No nystagmus.  No ptosis.   V:  Normal facial sensation.    VII:  Normal facial symmetry and movements.  No pathologic facial reflexes.  VIII:  Normal hearing and vestibular function.   IX-X:  Normal palatal movement.   XI:  Normal shoulder shrug and head rotation.   XII:  Normal tongue strength and range of motion, no deviation or fasciculation.  MOTOR: Motor strength is 5/5 throughout.  No atrophy, fasciculations or abnormal movements.  No pronator drift.  Tone is normal.   MSRs:  Reflexes are 1+/4 throughout.  Plantars are downgoing.  SENSORY:  Normal and symmetric perception of light touch, pinprick, vibration, and proprioception.  Romberg's sign absent.   COORDINATION/GAIT: Normal finger-to- nose-finger.  Intact rapid alternating movements bilaterally.  Able to rise from a chair without using arms.  Gait narrow based and stable. Tandem and stressed gait intact.    IMPRESSION: Mitchell Walker is a delightful 83 year-old gentleman referred for evaluation of short-term memory issues and inattention since late 2017.  His cognitive testing showed deficits almost exclusively in recall (-6 recall, MOCA 24/30).  Although these findings can be associated with age, memory changes from chronic alcohol use, and early signs of Alzheimer's dementia is also considered, especially since memory is interfering with his driving.  I discussed driving safety and the option for a formal driving assessment, which they will think about.  Memory work-up was disccussed as outlined below.   PLAN/RECOMMENDATIONS:  1.  Check vitamin B1, MMA 2.  MRI brain wo contrast 3.  Formal neurocognitive testing 4.  Reduce alcohol intake  Return to clinic in 4 months.   The duration of this appointment visit was 45 minutes of face-to-face time with the patient.  Greater than 50% of this time was spent in counseling, explanation of diagnosis, planning of further management, and coordination of care.   Thank you for  allowing me to participate in patient's care.  If I can answer any additional questions, I would be pleased to do so.    Sincerely,    Analiya Porco K. Posey Pronto, DO

## 2017-04-11 NOTE — Patient Instructions (Addendum)
1.  MRI brain without contrast 2.  Check blood work 3.  Neurocognitive testing 4.  Reduce alcohol intake 5.  Keep up the great work with staying active!  Return to clinic in 4 months

## 2017-04-12 ENCOUNTER — Ambulatory Visit: Payer: Medicare Other | Admitting: Family Medicine

## 2017-04-13 LAB — METHYLMALONIC ACID, SERUM: Methylmalonic Acid, Quant: 469 nmol/L — ABNORMAL HIGH (ref 87–318)

## 2017-04-15 ENCOUNTER — Telehealth: Payer: Self-pay | Admitting: *Deleted

## 2017-04-15 LAB — VITAMIN B1: VITAMIN B1 (THIAMINE): 10 nmol/L (ref 8–30)

## 2017-04-15 NOTE — Telephone Encounter (Signed)
Left message for patient to call me back. 

## 2017-04-15 NOTE — Telephone Encounter (Signed)
-----   Message from Alda Berthold, DO sent at 04/15/2017 12:33 PM EDT ----- Please inform patient that his labs indicate he does have vitamin B12 deficiency (MMA is elevated).  Start Vitamin B12 1072mcg IM injection daily x 7 days, weekly x 4 weeks, then monthly thereafter x 1 year.

## 2017-04-19 ENCOUNTER — Telehealth: Payer: Self-pay | Admitting: *Deleted

## 2017-04-19 NOTE — Telephone Encounter (Signed)
-----   Message from Alda Berthold, DO sent at 04/15/2017 12:33 PM EDT ----- Please inform patient that his labs indicate he does have vitamin B12 deficiency (MMA is elevated).  Start Vitamin B12 1051mcg IM injection daily x 7 days, weekly x 4 weeks, then monthly thereafter x 1 year.

## 2017-04-19 NOTE — Telephone Encounter (Signed)
Results and instructions given to patient and his wife.  He will begin injections on May23.

## 2017-04-20 ENCOUNTER — Ambulatory Visit
Admission: RE | Admit: 2017-04-20 | Discharge: 2017-04-20 | Disposition: A | Discharge status: Home / Self Care | Payer: Medicare Other | Source: Ambulatory Visit | Attending: Neurology | Admitting: Neurology

## 2017-04-20 DIAGNOSIS — G3184 Mild cognitive impairment, so stated: Secondary | ICD-10-CM

## 2017-04-20 DIAGNOSIS — R413 Other amnesia: Secondary | ICD-10-CM | POA: Diagnosis not present

## 2017-04-22 ENCOUNTER — Telehealth: Payer: Self-pay | Admitting: *Deleted

## 2017-04-22 NOTE — Telephone Encounter (Signed)
-----   Message from Alda Berthold, DO sent at 04/22/2017 11:15 AM EDT ----- Please inform patient that his MRI brain shows that there is moderate volume loss of the brain (shrinkage) which is seen with aging, but also with alcohol use.  I would recommend that he continue to cut down his intake.  Please reassure him that there is no evidence of stroke or tumor. There is also some sinus congestion, if he is having symptoms he may want to discuss this with his PCP.  Thanks.

## 2017-04-22 NOTE — Telephone Encounter (Signed)
Patient given results and instructions.   

## 2017-05-02 ENCOUNTER — Telehealth: Payer: Self-pay

## 2017-05-02 ENCOUNTER — Ambulatory Visit (INDEPENDENT_AMBULATORY_CARE_PROVIDER_SITE_OTHER): Payer: Medicare Other | Admitting: *Deleted

## 2017-05-02 ENCOUNTER — Encounter: Payer: Self-pay | Admitting: Family Medicine

## 2017-05-02 DIAGNOSIS — E538 Deficiency of other specified B group vitamins: Secondary | ICD-10-CM | POA: Diagnosis not present

## 2017-05-02 MED ORDER — CYANOCOBALAMIN 1000 MCG/ML IJ SOLN
1000.0000 ug | Freq: Once | INTRAMUSCULAR | Status: AC
Start: 2017-05-02 — End: 2017-05-02
  Administered 2017-05-02: 1000 ug via INTRAMUSCULAR

## 2017-05-02 NOTE — Telephone Encounter (Signed)
Called and scheduled for nurse visits

## 2017-05-02 NOTE — Telephone Encounter (Signed)
Yes thanks, ok to set him up for them here per schedule Dr. Posey Pronto recommends

## 2017-05-02 NOTE — Telephone Encounter (Signed)
[facility] called. Pt is seeing Dr. Posey Pronto and she has recommended B12 shots. He would like to get them here as our office is closer to his home.  Please see following note:  Notes recorded by Alda Berthold, DO on 04/15/2017 at 12:33 PM EDT Please inform patient that his labs indicate he does have vitamin B12 deficiency (MMA is elevated). Start Vitamin B12 1062mcg IM injection daily x 7 days, weekly x 4 weeks, then monthly thereafter x 1 year.   Ref Range & Units 3wk ago  Methylmalonic Acid, Quant 87 - 318 nmol/L 469           Dr. Yong Channel - Please advise if ok for pt to receive B12 injections here. Thanks!  Pt would like to be called to advise.

## 2017-05-03 ENCOUNTER — Ambulatory Visit (INDEPENDENT_AMBULATORY_CARE_PROVIDER_SITE_OTHER): Payer: Medicare Other

## 2017-05-03 DIAGNOSIS — E538 Deficiency of other specified B group vitamins: Secondary | ICD-10-CM

## 2017-05-03 MED ORDER — CYANOCOBALAMIN 1000 MCG/ML IJ SOLN
1000.0000 ug | Freq: Once | INTRAMUSCULAR | Status: AC
  Administered 2017-05-03: 1000 ug via INTRAMUSCULAR

## 2017-05-04 ENCOUNTER — Ambulatory Visit (INDEPENDENT_AMBULATORY_CARE_PROVIDER_SITE_OTHER): Payer: Medicare Other | Admitting: *Deleted

## 2017-05-04 DIAGNOSIS — E538 Deficiency of other specified B group vitamins: Secondary | ICD-10-CM

## 2017-05-04 MED ORDER — CYANOCOBALAMIN 1000 MCG/ML IJ SOLN
1000.0000 ug | Freq: Once | INTRAMUSCULAR | Status: AC
  Administered 2017-05-04: 1000 ug via INTRAMUSCULAR

## 2017-05-04 NOTE — Progress Notes (Signed)
Patient seen for b12 injection; injection administered IM to left deltoid; patient tolerated well; no s/s of reactions.

## 2017-05-05 ENCOUNTER — Ambulatory Visit (INDEPENDENT_AMBULATORY_CARE_PROVIDER_SITE_OTHER): Payer: Medicare Other

## 2017-05-05 DIAGNOSIS — E538 Deficiency of other specified B group vitamins: Secondary | ICD-10-CM | POA: Diagnosis not present

## 2017-05-05 MED ORDER — CYANOCOBALAMIN 1000 MCG/ML IJ SOLN
1000.0000 ug | Freq: Once | INTRAMUSCULAR | Status: AC
  Administered 2017-05-05: 1000 ug via INTRAMUSCULAR

## 2017-05-06 ENCOUNTER — Ambulatory Visit (INDEPENDENT_AMBULATORY_CARE_PROVIDER_SITE_OTHER): Payer: Medicare Other

## 2017-05-06 DIAGNOSIS — E538 Deficiency of other specified B group vitamins: Secondary | ICD-10-CM

## 2017-05-06 MED ORDER — CYANOCOBALAMIN 1000 MCG/ML IJ SOLN
1000.0000 ug | Freq: Once | INTRAMUSCULAR | Status: AC
  Administered 2017-05-06: 1000 ug via INTRAMUSCULAR

## 2017-05-06 NOTE — Progress Notes (Signed)
Patient got his B12 injection for his Vit B12 deficiency today 05/06/17 at 9.15am. Given by Rosealee Albee CMA

## 2017-05-10 ENCOUNTER — Ambulatory Visit (INDEPENDENT_AMBULATORY_CARE_PROVIDER_SITE_OTHER): Payer: Medicare Other | Admitting: *Deleted

## 2017-05-10 DIAGNOSIS — E538 Deficiency of other specified B group vitamins: Secondary | ICD-10-CM | POA: Diagnosis not present

## 2017-05-10 MED ORDER — CYANOCOBALAMIN 1000 MCG/ML IJ SOLN
1000.0000 ug | Freq: Once | INTRAMUSCULAR | Status: AC
  Administered 2017-05-10: 1000 ug via INTRAMUSCULAR

## 2017-05-10 NOTE — Progress Notes (Signed)
Patient here for b12 injection; administered IM to rt deltoid; patient tolerated well; no s/s of reactions. Per MD orders, patient to return for injection on 05/11/17, then patient to receive injections weekly x 4 weeks, then monthly x 1 year. Patient voiced understanding to instructions

## 2017-05-11 ENCOUNTER — Ambulatory Visit (INDEPENDENT_AMBULATORY_CARE_PROVIDER_SITE_OTHER): Payer: Medicare Other

## 2017-05-11 DIAGNOSIS — E538 Deficiency of other specified B group vitamins: Secondary | ICD-10-CM | POA: Diagnosis not present

## 2017-05-11 MED ORDER — CYANOCOBALAMIN 1000 MCG/ML IJ SOLN
1000.0000 ug | Freq: Once | INTRAMUSCULAR | Status: AC
  Administered 2017-05-11: 1000 ug via INTRAMUSCULAR

## 2017-05-12 ENCOUNTER — Encounter: Payer: Self-pay | Admitting: *Deleted

## 2017-05-12 ENCOUNTER — Ambulatory Visit (INDEPENDENT_AMBULATORY_CARE_PROVIDER_SITE_OTHER): Payer: Medicare Other | Admitting: *Deleted

## 2017-05-12 DIAGNOSIS — E538 Deficiency of other specified B group vitamins: Secondary | ICD-10-CM | POA: Diagnosis not present

## 2017-05-12 MED ORDER — CYANOCOBALAMIN 1000 MCG/ML IJ SOLN
1000.0000 ug | Freq: Once | INTRAMUSCULAR | Status: AC
  Administered 2017-05-12: 1000 ug via INTRAMUSCULAR

## 2017-05-12 NOTE — Progress Notes (Signed)
Patient in for a nurse visit B12 injection only

## 2017-05-12 NOTE — Progress Notes (Signed)
Lab Results  Component Value Date   [id number] 10/22/2016  continue monthly injections. Will need repeat b12 at 1 year  Mitchell Walker

## 2017-05-13 ENCOUNTER — Ambulatory Visit: Payer: Medicare Other

## 2017-05-18 ENCOUNTER — Ambulatory Visit (INDEPENDENT_AMBULATORY_CARE_PROVIDER_SITE_OTHER): Payer: Medicare Other

## 2017-05-18 DIAGNOSIS — E538 Deficiency of other specified B group vitamins: Secondary | ICD-10-CM | POA: Diagnosis not present

## 2017-05-18 MED ORDER — CYANOCOBALAMIN 1000 MCG/ML IJ SOLN
1000.0000 ug | Freq: Once | INTRAMUSCULAR | Status: AC
  Administered 2017-05-18: 1000 ug via INTRAMUSCULAR

## 2017-05-20 ENCOUNTER — Encounter: Payer: Self-pay | Admitting: Family Medicine

## 2017-05-20 ENCOUNTER — Other Ambulatory Visit: Payer: Self-pay

## 2017-05-20 ENCOUNTER — Ambulatory Visit (INDEPENDENT_AMBULATORY_CARE_PROVIDER_SITE_OTHER): Payer: Medicare Other | Admitting: Family Medicine

## 2017-05-20 VITALS — BP 138/68 | HR 60 | Temp 98.0°F | Ht 67.75 in | Wt 195.4 lb

## 2017-05-20 DIAGNOSIS — E538 Deficiency of other specified B group vitamins: Secondary | ICD-10-CM | POA: Diagnosis not present

## 2017-05-20 DIAGNOSIS — Z7189 Other specified counseling: Secondary | ICD-10-CM | POA: Diagnosis not present

## 2017-05-20 DIAGNOSIS — Z0000 Encounter for general adult medical examination without abnormal findings: Secondary | ICD-10-CM

## 2017-05-20 DIAGNOSIS — E785 Hyperlipidemia, unspecified: Secondary | ICD-10-CM | POA: Diagnosis not present

## 2017-05-20 DIAGNOSIS — F528 Other sexual dysfunction not due to a substance or known physiological condition: Secondary | ICD-10-CM

## 2017-05-20 DIAGNOSIS — I10 Essential (primary) hypertension: Secondary | ICD-10-CM | POA: Diagnosis not present

## 2017-05-20 DIAGNOSIS — E119 Type 2 diabetes mellitus without complications: Secondary | ICD-10-CM | POA: Diagnosis not present

## 2017-05-20 DIAGNOSIS — R739 Hyperglycemia, unspecified: Secondary | ICD-10-CM | POA: Diagnosis not present

## 2017-05-20 LAB — COMPREHENSIVE METABOLIC PANEL
ALT: 19 U/L (ref 0–53)
AST: 20 U/L (ref 0–37)
Albumin: 4.2 g/dL (ref 3.5–5.2)
Alkaline Phosphatase: 69 U/L (ref 39–117)
BILIRUBIN TOTAL: 1 mg/dL (ref 0.2–1.2)
BUN: 8 mg/dL (ref 6–23)
CALCIUM: 9.3 mg/dL (ref 8.4–10.5)
CO2: 26 meq/L (ref 19–32)
Chloride: 98 mEq/L (ref 96–112)
Creatinine, Ser: 0.84 mg/dL (ref 0.40–1.50)
GFR: 93.25 mL/min (ref >60.00)
Glucose, Bld: 125 mg/dL — ABNORMAL HIGH (ref 70–99)
POTASSIUM: 3.9 meq/L (ref 3.5–5.1)
Sodium: 133 mEq/L — ABNORMAL LOW (ref 135–145)
Total Protein: 7.1 g/dL (ref 6.0–8.3)

## 2017-05-20 LAB — CBC
HEMATOCRIT: 41.8 % (ref 39.0–52.0)
HEMOGLOBIN: 14.3 g/dL (ref 13.0–17.0)
MCHC: 34.2 g/dL (ref 30.0–36.0)
MCV: 86.1 fl (ref 78.0–100.0)
PLATELETS: 238 10*3/uL (ref 150.0–400.0)
RBC: 4.86 Mil/uL (ref 4.22–5.81)
RDW: 13.5 % (ref 11.5–15.5)
WBC: 6 10*3/uL (ref 4.0–10.5)

## 2017-05-20 LAB — HEMOGLOBIN A1C: Hgb A1c MFr Bld: 7.2 % — ABNORMAL HIGH (ref 4.6–6.5)

## 2017-05-20 LAB — LIPID PANEL
CHOL/HDL RATIO: 4
Cholesterol: 180 mg/dL (ref 0–200)
HDL: 40.8 mg/dL (ref >39.00)
LDL Cholesterol: 118 mg/dL — ABNORMAL HIGH (ref 0–99)
NonHDL: 138.73
Triglycerides: 102 mg/dL (ref 0.0–149.0)
VLDL: 20.4 mg/dL (ref 0.0–40.0)

## 2017-05-20 MED ORDER — AMLODIPINE BESYLATE 10 MG PO TABS: 10.0000 mg | ORAL_TABLET | Freq: Every day | ORAL | 3 refills | Status: DC

## 2017-05-20 MED ORDER — SILDENAFIL CITRATE 100 MG PO TABS: 100.0000 mg | ORAL_TABLET | Freq: Every day | ORAL | 11 refills | Status: DC | PRN

## 2017-05-20 MED ORDER — BENAZEPRIL-HYDROCHLOROTHIAZIDE 20-25 MG PO TABS: 1.0000 | ORAL_TABLET | Freq: Every day | ORAL | 3 refills | Status: DC

## 2017-05-20 NOTE — Assessment & Plan Note (Signed)
S: Viagra prn- samples formerly. Soft erection not sure if can ejaculate- difficult to penetrate A/P: send in viagra as 100mg - may trial 1/2 tablet to start. May try full tablet. I may send in sildenafil/revatio if cost prohibiative

## 2017-05-20 NOTE — Assessment & Plan Note (Signed)
S: controlled on benazepril-hctz 20-25mg  as well as amlodipine 10mg . .  ASCVD 10 year risk calculation if age 81-79: outside age range BP Readings from Last 3 Encounters:  05/20/17 138/68  04/11/17 (!) 148/88  01/11/17 132/62  A/P: We discussed blood pressure goal of <140/90- technically could go up to 150 but prefer under 140 SBP if possible. Continue current meds

## 2017-05-20 NOTE — Patient Instructions (Addendum)
Take 1/2 tablet of viagra trial at first. If too expensive- I can send in medication to [facility] drug which will be cheaper  Labs before you go   Mr. Mitchell Walker , Thank you for taking time to come for your Medicare Wellness Visit. I appreciate your ongoing commitment to your health goals. Please review the following plan we discussed and let me know if I can assist you in the future.   Good luck on your weight loss  Shingrix is a vaccine for the prevention of Shingles in Adults 50 and older.  If you are on Medicare, you can request a prescription from your doctor to be filled at a pharmacy.  Please check with your benefits regarding applicable copays or out of pocket expenses.  The Shingrix is given in 2 vaccines approx 8 weeks apart. You must receive the 2nd dose prior to 6 months from receipt of the first.    These are the goals we discussed: Goals    . Weight (lb) < 188 lb (85.3 kg)          Would stop sugar Cut back on bread  Check out  online nutrition programs as GumSearch.nl and http://vang.com/; fit44me; Loseit or calorieking.com  Look for foods with "whole" wheat; bran; oatmeal etc Shot at the farmer's markets in season for fresher choices  Watch for "hydrogenated" on the label of oils which are trans-fats.  Watch for "high fructose corn syrup" in snacks, yogurt or ketchup  Meats have less marbling; bright colored fruits and vegetables;  Canned; dump out liquid and wash vegetables. Be mindful of what we are eating  Portion control is essential to a health weight! Sit down; take a break and enjoy your meal; take smaller bites; put the fork down between bites;  It takes 20 minutes to get full; so check in with your fullness cues and stop eating when you start to fill full   Serving Sizes A serving size is a measured amount of food or drink, such as one slice of bread, that has an associated nutrient content. Knowing the serving size of a food or drink can help you determine  how much of that food you should consume. What is the size of one serving? The size of one healthy serving depends on the food or drink. To determine a serving size, read the food label. If the food or drink does not have a food label, try to find serving size information online. Or, use the following to estimate the size of one adult serving: Grain 1 slice bread.  bagel.  cup pasta. Vegetable  cup cooked or canned vegetables. 1 cup raw, leafy greens. Fruit  cup canned fruit. 1 medium fruit.  cup dried fruit. Meat and Other Protein Sources 1 oz meat, poultry, or fish.  cup cooked beans. 1 egg.  cup nuts or seeds. 1 Tbsp nut butter.  cup tofu or tempeh. 2 Tbsp hummus. Dairy An individual container of yogurt (6-8 oz). 1 piece of cheese the size of your thumb (1 oz). 1 cup (8 oz) milk or milk alternative. Fat A piece the size of one dice. 1 tsp soft margarine. 1 Tbsp mayonnaise. 1 tsp vegetable oil. 1 Tbsp regular salad dressing. 2 Tbsp low-fat salad dressing. How many servings should I eat from each food group each day? The following are the suggested number of servings to try and have every day from each food group. You can also look at your eating throughout the week and  aim for meeting these requirements on most days for overall healthy eating. Grain 6-8 servings. Try to have half of your grains from whole grains, such as whole wheat bread, corn tortillas, oatmeal, brown rice, whole wheat pasta, and bulgur. Vegetable At least 2-3 servings. Fruit 2 servings. Meat and Other Protein Foods 5-6 servings. Aim to have lean proteins, such as chicken, [place], fish, beans, or tofu. Dairy 3 servings. Choose low-fat or nonfat if you are trying to control your weight. Fat 2-3 servings. Is a serving the same thing as a portion? No. A portion is the actual amount you eat, which may be more than one serving. Knowing the specific serving size of a food and the nutritional information that goes  with it can help you make a healthy decision on what size portion to eat. What are some tips to help me learn healthy serving sizes?  Check food labels for serving sizes. Many foods that come as a single portion actually contain multiple servings.  Determine the serving size of foods you commonly eat and figure out how large a portion you usually eat.  Measure the number of servings that can be held by the bowls, glasses, cups, and plates you typically use. For example, pour your breakfast cereal into your regular bowl and then pour it into a measuring cup.  For 1-2 days, measure the serving sizes of all the foods you eat.  Practice estimating serving sizes and determining how big your portions should be. This information is not intended to replace advice given to you by your health care provider. Make sure you discuss any questions you have with your health care provider. Document Released: 08/28/2003 Document Revised: 07/24/2016 Document Reviewed: 02/26/2014 Elsevier Interactive Patient Education  [employer].           This is a list of the screening recommended for you and due dates:  Health Maintenance  Topic Date Due  . Flu Shot  07/13/2017  . Tetanus Vaccine  05/20/2024  . Pneumonia vaccines  Completed    Health Maintenance, Male A healthy lifestyle and preventive care is important for your health and wellness. Ask your health care provider about what schedule of regular examinations is right for you. What should I know about weight and diet? Eat a Healthy Diet  Eat plenty of vegetables, fruits, whole grains, low-fat dairy products, and lean protein.  Do not eat a lot of foods high in solid fats, added sugars, or salt.  Maintain a Healthy Weight Regular exercise can help you achieve or maintain a healthy weight. You should:  Do at least 150 minutes of exercise each week. The exercise should increase your heart rate and make you sweat (moderate-intensity  exercise).  Do strength-training exercises at least twice a week.  Watch Your Levels of Cholesterol and Blood Lipids  Have your blood tested for lipids and cholesterol every 5 years starting at 81 years of age. If you are at high risk for heart disease, you should start having your blood tested when you are 81 years old. You may need to have your cholesterol levels checked more often if: ? Your lipid or cholesterol levels are high. ? You are older than 81 years of age. ? You are at high risk for heart disease.  What should I know about cancer screening? Many types of cancers can be detected early and may often be prevented. Lung Cancer  You should be screened every year for lung cancer if: ? You  are a current smoker who has smoked for at least 30 years. ? You are a former smoker who has quit within the past 15 years.  Talk to your health care provider about your screening options, when you should start screening, and how often you should be screened.  Colorectal Cancer  Routine colorectal cancer screening usually begins at 80 years of age and should be repeated every 5-10 years until you are 81 years old. You may need to be screened more often if early forms of precancerous polyps or small growths are found. Your health care provider may recommend screening at an earlier age if you have risk factors for colon cancer.  Your health care provider may recommend using home test kits to check for hidden blood in the stool.  A small camera at the end of a tube can be used to examine your colon (sigmoidoscopy or colonoscopy). This checks for the earliest forms of colorectal cancer.  Prostate and Testicular Cancer  Depending on your age and overall health, your health care provider may do certain tests to screen for prostate and testicular cancer.  Talk to your health care provider about any symptoms or concerns you have about testicular or prostate cancer.  Skin Cancer  Check your skin  from head to toe regularly.  Tell your health care provider about any new moles or changes in moles, especially if: ? There is a change in a mole's size, shape, or color. ? You have a mole that is larger than a pencil eraser.  Always use sunscreen. Apply sunscreen liberally and repeat throughout the day.  Protect yourself by wearing long sleeves, pants, a wide-brimmed hat, and sunglasses when outside.  What should I know about heart disease, diabetes, and high blood pressure?  If you are 61-75 years of age, have your blood pressure checked every 3-5 years. If you are 64 years of age or older, have your blood pressure checked every year. You should have your blood pressure measured twice-once when you are at a hospital or clinic, and once when you are not at a hospital or clinic. Record the average of the two measurements. To check your blood pressure when you are not at a hospital or clinic, you can use: ? An automated blood pressure machine at a pharmacy. ? A home blood pressure monitor.  Talk to your health care provider about your target blood pressure.  If you are between 74-26 years old, ask your health care provider if you should take aspirin to prevent heart disease.  Have regular diabetes screenings by checking your fasting blood sugar level. ? If you are at a normal weight and have a low risk for diabetes, have this test once every three years after the age of 57. ? If you are overweight and have a high risk for diabetes, consider being tested at a younger age or more often.  A one-time screening for abdominal aortic aneurysm (AAA) by ultrasound is recommended for men aged 64-75 years who are current or former smokers. What should I know about preventing infection? Hepatitis B If you have a higher risk for hepatitis B, you should be screened for this virus. Talk with your health care provider to find out if you are at risk for hepatitis B infection. Hepatitis C Blood testing is  recommended for:  Everyone born from [birth year] through 1963.  Anyone with known risk factors for hepatitis C.  Sexually Transmitted Diseases (STDs)  You should be screened each year for  STDs including gonorrhea and chlamydia if: ? You are sexually active and are younger than 81 years of age. ? You are older than 81 years of age and your health care provider tells you that you are at risk for this type of infection. ? Your sexual activity has changed since you were last screened and you are at an increased risk for chlamydia or gonorrhea. Ask your health care provider if you are at risk.  Talk with your health care provider about whether you are at high risk of being infected with HIV. Your health care provider may recommend a prescription medicine to help prevent HIV infection.  What else can I do?  Schedule regular health, dental, and eye exams.  Stay current with your vaccines (immunizations).  Do not use any tobacco products, such as cigarettes, chewing tobacco, and e-cigarettes. If you need help quitting, ask your health care provider.  Limit alcohol intake to no more than 2 drinks per day. One drink equals 12 ounces of beer, 5 ounces of wine, or 1 ounces of hard liquor.  Do not use street drugs.  Do not share needles.  Ask your health care provider for help if you need support or information about quitting drugs.  Tell your health care provider if you often feel depressed.  Tell your health care provider if you have ever been abused or do not feel safe at home. This information is not intended to replace advice given to you by your health care provider. Make sure you discuss any questions you have with your health care provider. Document Released: 05/27/2008 Document Revised: 07/28/2016 Document Reviewed: 09/02/2015 Elsevier Interactive Patient Education  2018 [place] in the Home Falls can cause injuries and can affect people from all age groups.  There are many simple things that you can do to make your home safe and to help prevent falls. What can I do on the outside of my home?  Regularly repair the edges of walkways and driveways and fix any cracks.  Remove high doorway thresholds.  Trim any shrubbery on the main path into your home.  Use bright outdoor lighting.  Clear walkways of debris and clutter, including tools and rocks.  Regularly check that handrails are securely fastened and in good repair. Both sides of any steps should have handrails.  Install guardrails along the edges of any raised decks or porches.  Have leaves, snow, and ice cleared regularly.  Use sand or salt on walkways during winter months.  In the garage, clean up any spills right away, including grease or oil spills. What can I do in the bathroom?  Use night lights.  Install grab bars by the toilet and in the tub and shower. Do not use towel bars as grab bars.  Use non-skid mats or decals on the floor of the tub or shower.  If you need to sit down while you are in the shower, use a plastic, non-slip stool.  Keep the floor dry. Immediately clean up any water that spills on the floor.  Remove soap buildup in the tub or shower on a regular basis.  Attach bath mats securely with double-sided non-slip rug tape.  Remove throw rugs and other tripping hazards from the floor. What can I do in the bedroom?  Use night lights.  Make sure that a bedside light is easy to reach.  Do not use oversized bedding that drapes onto the floor.  Have a firm chair that  has side arms to use for getting dressed.  Remove throw rugs and other tripping hazards from the floor. What can I do in the kitchen?  Clean up any spills right away.  Avoid walking on wet floors.  Place frequently used items in easy-to-reach places.  If you need to reach for something above you, use a sturdy step stool that has a grab bar.  Keep electrical cables out of the  way.  Do not use floor polish or wax that makes floors slippery. If you have to use wax, make sure that it is non-skid floor wax.  Remove throw rugs and other tripping hazards from the floor. What can I do in the stairways?  Do not leave any items on the stairs.  Make sure that there are handrails on both sides of the stairs. Fix handrails that are broken or loose. Make sure that handrails are as long as the stairways.  Check any carpeting to make sure that it is firmly attached to the stairs. Fix any carpet that is loose or worn.  Avoid having throw rugs at the top or bottom of stairways, or secure the rugs with carpet tape to prevent them from moving.  Make sure that you have a light switch at the top of the stairs and the bottom of the stairs. If you do not have them, have them installed. What are some other fall prevention tips?  Wear closed-toe shoes that fit well and support your feet. Wear shoes that have rubber soles or low heels.  When you use a stepladder, make sure that it is completely opened and that the sides are firmly locked. Have someone hold the ladder while you are using it. Do not climb a closed stepladder.  Add color or contrast paint or tape to grab bars and handrails in your home. Place contrasting color strips on the first and last steps.  Use mobility aids as needed, such as canes, walkers, scooters, and crutches.  Turn on lights if it is dark. Replace any light bulbs that burn out.  Set up furniture so that there are clear paths. Keep the furniture in the same spot.  Fix any uneven floor surfaces.  Choose a carpet design that does not hide the edge of steps of a stairway.  Be aware of any and all pets.  Review your medicines with your healthcare provider. Some medicines can cause dizziness or changes in blood pressure, which increase your risk of falling. Talk with your health care provider about other ways that you can decrease your risk of falls. This  may include working with a physical therapist or trainer to improve your strength, balance, and endurance. This information is not intended to replace advice given to you by your health care provider. Make sure you discuss any questions you have with your health care provider. Document Released: 11/19/2002 Document Revised: 04/27/2016 Document Reviewed: 01/03/2015 Elsevier Interactive Patient Education  2017 [employer].

## 2017-05-20 NOTE — Assessment & Plan Note (Signed)
Lab Results  Component Value Date   HGBA1C 7.2 (H) 05/20/2017  new diagnosis. Will refer to diabetes education. Follow up 14 weeks. Consider metformin if not improved at that time

## 2017-05-20 NOTE — Assessment & Plan Note (Signed)
Brings document [place] decleration of desire for a natural death. If terminal or incurable condition- my physician may withold or discontinue either artificial nutrition or hydration or both. If my physician deterrmines that I am in a persistent vegetative state, I authorize- withholding or d/c extraordinary means or nutrition/hydration as well.   Still wants to remain full code at this time though as long as reasonable chance of recovery.   Wife HCPOA

## 2017-05-20 NOTE — Assessment & Plan Note (Signed)
S: lipids have been elevated but outside clear benefit compared to risk range at his age Lab Results  Component Value Date   CHOL 209 (H) 05/18/2016   HDL 46.90 05/18/2016   LDLCALC 138 (H) 05/18/2016   LDLDIRECT 127.3 02/11/2010   TRIG 122.0 05/18/2016   CHOLHDL 4 05/18/2016  A/P: update lipids. Focus on healthy diet and exercise.

## 2017-05-20 NOTE — Progress Notes (Signed)
Subjective:   Mitchell Walker is a 83 y.o. male who presents for Medicare Annual/Subsequent preventive examination.  The Patient was informed that the wellness visit is to identify future health risk and educate and initiate measures that can reduce risk for increased disease through the lifespan.    NO ROS; Medicare Wellness Visit  Describes health as good, fair or great? Good   Preventive Screening -Counseling & Management  Colonoscopy; 01/2010 and wife states they will check on this as they may repeat  Smoking history - remote   Smokeless tobacco  Second Hand Smoke status; No Smokers in the home ETOH -2  Beers at hs  Medication adherence or issues? No   RISK FACTORS Diet Skips breakfast [place]  Lunch; is the main meal; meat and vegetables Supper light;   Regular exercise  Cutting the grass; yard work X 2 a week  Did ride a bicycle, would love to start Did not advocate for any road biking; discussed traveling with bike in truck of care and how to place it without damage; discussed Y but wife states no insurance covers it and [place] [facility] is across town fo rthem [place] Factors:  Advanced aged > 85 in men; >65 in women Hyperlipidemia HDL 46; will try to start riding bikes Diabetes - bs little  Family History reviewed  Obesity 30   Fall risk  Given education on "Fall Prevention in the Home" for more safety tips the patient can apply as appropriate.    Mobility of Functional changes this year? Safety; community, wears sunscreen, dr Elvera Lennox; recommend go every year   safe place for firearms; to keep in  A safe place  Motor vehicle accidents;  No   Mental Health:  Any emotional problems? Anxious, depressed, irritable, sad or blue? no Denies feeling depressed or hopeless; voices pleasure in daily life How many social activities have you been engaged in within the last 2 weeks? No  Hearing Screening  Comments: Hearing Mild hearing loss Have it tested [facility];   Vision Screening Comments: Vision checks; gets every year Dr. Kathrin Penner Had narrow angle glaucoma; tx with laser    Activities of Daily Living - See functional screen   Cognitive testing;  Dr Posey Pronto; checking memory / prefer to defer today  Ad8 score; 0 or less than 2  MMSE deferred or completed if AD8 + 2 issues  Advanced Directives yes; and brought into Dr. Yong Channel and made copies today Will bring in [place]   Patient Care Team: [name], MD as PCP - General (Family Medicine) Dr Elvera Lennox;  Dr. Lottie Rater for memory   Immunization History  Administered Date(s) Administered  . Influenza Split 11/18/2011, 11/01/2012  . Influenza Whole 09/01/2010  . Influenza, High Dose Seasonal PF 09/18/2014, 10/28/2015, 10/12/2016  . Influenza,inj,Quad PF,36+ Mos 09/11/2013  . Pneumococcal Conjugate-13 05/21/2015  . Pneumococcal Polysaccharide-23 12/13/2004, 02/11/2010  . Td 12/14/2003  . Tetanus 05/20/2014  . Zoster 03/04/2010   Required Immunizations needed today  Screening test up to date or reviewed for plan of completion There are no preventive care reminders to display for this patient.  Cardiac Risk Factors include: advanced age (>85men, >25 women);dyslipidemia;family history of premature cardiovascular disease;hypertension;male gender;obesity (BMI >30kg/m2)     Objective:    Vitals: BP 138/68 (BP Location: Left Arm, Patient Position: Sitting, Cuff Size: Large)   Pulse 60   Temp 98 F (36.7 C) (Oral)   Ht  5' 7.75" (1.721 m)   Wt 195 lb 6.4 oz (88.6 kg)   SpO2 92%   BMI 29.93 kg/m   Body mass index is 29.93 kg/m.  Tobacco History  Smoking Status  . Former Smoker  . Packs/day: 1.00  . Years: 25.00  . Types: Cigarettes  . Quit date: 01/27/1974  Smokeless Tobacco  . Never Used    Comment: quit smoking 40 years ago     Counseling given: Not Answered   Past Medical History:  Diagnosis Date  .  EXTERNAL HEMORRHOIDS, THROMBOSED 08/12/2010  . Hypertension   . Osteoarthritis   . Rosacea, acne   . ROTATOR CUFF TEAR 02/06/2008   Worked through with PT     Past Surgical History:  Procedure Laterality Date  . GLAUCOMA SURGERY    . JOINT REPLACEMENT     total knee replacement biltateral   Family History  Problem Relation Age of Onset  . Dementia Mother   . Heart attack Father        in his 73s, sedentary  . Osteoporosis Sister    History  Sexual Activity  . Sexual activity: Yes    Outpatient Encounter Prescriptions as of 05/20/2017  Medication Sig  . Omega-3 Fatty Acids (FISH OIL) 1000 MG CAPS Take 1 capsule by mouth daily.  . [DISCONTINUED] amLODipine (NORVASC) 10 MG tablet Take 1 tablet (10 mg total) by mouth daily.  . [DISCONTINUED] benazepril-hydrochlorthiazide (LOTENSIN HCT) 20-25 MG tablet Take 1 tablet by mouth daily.  . sildenafil (VIAGRA) 100 MG tablet Take 1 tablet (100 mg total) by mouth daily as needed for erectile dysfunction.   No facility-administered encounter medications on file as of 05/20/2017.     Activities of Daily Living In your present state of health, do you have any difficulty performing the following activities: 05/20/2017  Hearing? N  Vision? N  Difficulty concentrating or making decisions? Y  Walking or climbing stairs? N  Dressing or bathing? N  Doing errands, shopping? N  Preparing Food and eating ? N  Using the Toilet? N  In the past six months, have you accidently leaked urine? N  Do you have problems with loss of bowel control? N  Managing your Medications? N  Managing your Finances? N  Housekeeping or managing your Housekeeping? N  Some recent data might be hidden    Patient Care Team: [name], MD as PCP - General (Family Medicine)   Assessment:     Exercise Activities and Dietary recommendations Current Exercise Habits: Home exercise routine, Type of exercise: strength training/weights (add biking ), Time (Minutes): >  60, Frequency (Times/Week): 3, Weekly Exercise (Minutes/Week): 0, Intensity: Moderate  Goals    . Weight (lb) < 188 lb (85.3 kg)          Would stop sugar Cut back on bread  Check out  online nutrition programs as GumSearch.nl and http://vang.com/; fit28me; Loseit or calorieking.com  Look for foods with "whole" wheat; bran; oatmeal etc Shot at the farmer's markets in season for fresher choices  Watch for "hydrogenated" on the label of oils which are trans-fats.  Watch for "high fructose corn syrup" in snacks, yogurt or ketchup  Meats have less marbling; bright colored fruits and vegetables;  Canned; dump out liquid and wash vegetables. Be mindful of what we are eating  Portion control is essential to a health weight! Sit down; take a break and enjoy your meal; take smaller bites; put the fork down between bites;  It takes  20 minutes to get full; so check in with your fullness cues and stop eating when you start to fill full   Serving Sizes A serving size is a measured amount of food or drink, such as one slice of bread, that has an associated nutrient content. Knowing the serving size of a food or drink can help you determine how much of that food you should consume. What is the size of one serving? The size of one healthy serving depends on the food or drink. To determine a serving size, read the food label. If the food or drink does not have a food label, try to find serving size information online. Or, use the following to estimate the size of one adult serving: Grain 1 slice bread.  bagel.  cup pasta. Vegetable  cup cooked or canned vegetables. 1 cup raw, leafy greens. Fruit  cup canned fruit. 1 medium fruit.  cup dried fruit. Meat and Other Protein Sources 1 oz meat, poultry, or fish.  cup cooked beans. 1 egg.  cup nuts or seeds. 1 Tbsp nut butter.  cup tofu or tempeh. 2 Tbsp hummus. Dairy An individual container of yogurt (6-8 oz). 1 piece of cheese the size of  your thumb (1 oz). 1 cup (8 oz) milk or milk alternative. Fat A piece the size of one dice. 1 tsp soft margarine. 1 Tbsp mayonnaise. 1 tsp vegetable oil. 1 Tbsp regular salad dressing. 2 Tbsp low-fat salad dressing. How many servings should I eat from each food group each day? The following are the suggested number of servings to try and have every day from each food group. You can also look at your eating throughout the week and aim for meeting these requirements on most days for overall healthy eating. Grain 6-8 servings. Try to have half of your grains from whole grains, such as whole wheat bread, corn tortillas, oatmeal, brown rice, whole wheat pasta, and bulgur. Vegetable At least 2-3 servings. Fruit 2 servings. Meat and Other Protein Foods 5-6 servings. Aim to have lean proteins, such as chicken, [place], fish, beans, or tofu. Dairy 3 servings. Choose low-fat or nonfat if you are trying to control your weight. Fat 2-3 servings. Is a serving the same thing as a portion? No. A portion is the actual amount you eat, which may be more than one serving. Knowing the specific serving size of a food and the nutritional information that goes with it can help you make a healthy decision on what size portion to eat. What are some tips to help me learn healthy serving sizes?  Check food labels for serving sizes. Many foods that come as a single portion actually contain multiple servings.  Determine the serving size of foods you commonly eat and figure out how large a portion you usually eat.  Measure the number of servings that can be held by the bowls, glasses, cups, and plates you typically use. For example, pour your breakfast cereal into your regular bowl and then pour it into a measuring cup.  For 1-2 days, measure the serving sizes of all the foods you eat.  Practice estimating serving sizes and determining how big your portions should be. This information is not intended to replace  advice given to you by your health care provider. Make sure you discuss any questions you have with your health care provider. Document Released: 08/28/2003 Document Revised: 07/24/2016 Document Reviewed: 02/26/2014 Elsevier Interactive Patient Education  [employer].  Fall Risk Fall Risk  05/20/2017 05/20/2017 04/11/2017 05/18/2016 05/21/2015  Falls in the past year? No No No No No   Depression Screen PHQ 2/9 Scores 05/20/2017 05/20/2017 05/18/2016 05/21/2015  PHQ - 2 Score 0 0 0 0    Cognitive Function   Montreal Cognitive Assessment  04/11/2017  Visuospatial/ Executive (0/5) 4  Naming (0/3) 3  Attention: Read list of digits (0/2) 2  Attention: Read list of letters (0/1) 1  Attention: Serial 7 subtraction starting at 100 (0/3) 3  Language: Repeat phrase (0/2) 2  Language : Fluency (0/1) 1  Abstraction (0/2) 1  Delayed Recall (0/5) 0  Orientation (0/6) 6  Total 23  Adjusted Score (based on education) 24      Immunization History  Administered Date(s) Administered  . Influenza Split 11/18/2011, 11/01/2012  . Influenza Whole 09/01/2010  . Influenza, High Dose Seasonal PF 09/18/2014, 10/28/2015, 10/12/2016  . Influenza,inj,Quad PF,36+ Mos 09/11/2013  . Pneumococcal Conjugate-13 05/21/2015  . Pneumococcal Polysaccharide-23 12/13/2004, 02/11/2010  . Td 12/14/2003  . Tetanus 05/20/2014  . Zoster 03/04/2010   Screening Tests Health Maintenance  Topic Date Due  . INFLUENZA VACCINE  07/13/2017  . TETANUS/TDAP  05/20/2024  . PNA vac Low Risk Adult  Completed      Plan:     PCP Notes  Health Maintenance Educated regarding weight loss; plan in place, see goals Discussed exercise one day a week as he works in the yard 2 days  Week Wants to ride his bike at the park. Feels safe doing so; did not recommend riding on the road or hwy.  Does wear sunscreen but see Dr. Elvera Lennox q 2 years; recommended every year and to schedule soon  Wife states they may schedule  another colonoscopy; she will discuss with GI   Educated regarding the shingrix; will discuss with Dr. Yong Channel next visit   To make fup apt with Dr. Yong Channel in 6 months   Abnormal Screens none Hearing was checked [facility]   Referrals  none  Patient concerns; none Appears happy, relaxed and enjoying life    Nurse Concerns; Deferred memory test per the wife as he is being tested.   Next PCP apt was today   I have personally reviewed and noted the following in the patient's chart:   . Medical and social history . Use of alcohol, tobacco or illicit drugs  . Current medications and supplements . Functional ability and status . Nutritional status . Physical activity . Advanced directives . List of other physicians . Hospitalizations, surgeries, and ER visits in previous 12 months . Vitals . Screenings to include cognitive, depression, and falls . Referrals and appointments  In addition, I have reviewed and discussed with patient certain preventive protocols, quality metrics, and best practice recommendations. A written personalized care plan for preventive services as well as general preventive health recommendations were provided to patient.     WPYKD,XIPJA, RN  05/20/2017  I have reviewed and agree with note, evaluation, plan. See addendum to my note in regards to labs.   Garret Reddish, MD

## 2017-05-20 NOTE — Assessment & Plan Note (Signed)
S: B12 deficiency discovered by Dr. Posey Pronto with b12 normal but MMA elevated. He has been getting daily injections for 7 days then will transition to once weekly for 4 weeks then monthly for 1 year.  A/P: continue plan as per Dr. Posey Pronto- receiving injections here. Next week will be weekly injection #2

## 2017-05-20 NOTE — Progress Notes (Signed)
Subjective:  Mitchell Walker is a 83 y.o. year old very pleasant male patient who presents for/with See problem oriented charting ROS- No chest pain or shortness of breath. No headache or blurry vision. When he gets tired- memory seems to be worse   Past Medical History-  Patient Active Problem List   Diagnosis Date Noted  . Advanced directives, counseling/discussion 05/20/2017    Priority: High  . B12 deficiency 05/02/2017    Priority: High  . Hyperglycemia 05/20/2017    Priority: Medium  . Memory loss 10/22/2016    Priority: Medium  . Hyperlipidemia 02/03/2009    Priority: Medium  . Essential hypertension 07/17/2007    Priority: Medium  . Former smoker 05/21/2015    Priority: Low  . Adenomatous colon polyp 05/21/2015    Priority: Low  . Chronic frontal sinusitis 12/30/2010    Priority: Low  . ACNE ROSACEA 04/15/2010    Priority: Low  . Actinic keratosis 02/03/2009    Priority: Low  . Basal cell carcinoma of skin 02/29/2008    Priority: Low  . ERECTILE DYSFUNCTION 12/11/2007    Priority: Low  . Osteoarthritis 07/17/2007    Priority: Low    Medications- reviewed and updated Current Outpatient Prescriptions  Medication Sig Dispense Refill  . Omega-3 Fatty Acids (FISH OIL) 1000 MG CAPS Take 1 capsule by mouth daily.    Marland Kitchen amLODipine (NORVASC) 10 MG tablet Take 1 tablet (10 mg total) by mouth daily. 91 tablet 3  . benazepril-hydrochlorthiazide (LOTENSIN HCT) 20-25 MG tablet Take 1 tablet by mouth daily. 91 tablet 3  . sildenafil (VIAGRA) 100 MG tablet Take 1 tablet (100 mg total) by mouth daily as needed for erectile dysfunction. 10 tablet 11   No current facility-administered medications for this visit.     Objective: BP 138/68 (BP Location: Left Arm, Patient Position: Sitting, Cuff Size: Large)   Pulse 60   Temp 98 F (36.7 C) (Oral)   Ht 5' 7.75" (1.721 m)   Wt 195 lb 6.4 oz (88.6 kg)   SpO2 92%   BMI 29.93 kg/m  Gen: NAD, resting comfortably CV: RRR no murmurs  rubs or gallops Lungs: CTAB no crackles, wheeze, rhonchi Abdomen: soft/nontender/nondistended/normal bowel sounds. No rebound or guarding. overweight Ext: no edema Skin: warm, dry, several seborrheic keratosis on back and chest Neuro: grossly normal, moves all extremities  Assessment/Plan:  Former smoker 25 pack years quit 1976- aged out of lung cancer screening and AAA screen  2011 colonoscopy with history of adenoma- advised of need for repeat exam. He did not return GI calls.   Passed age for prostate cancer screening  Skin cancer screening- advised regular sunscreen use. Discussed follow up with dermatology with history basal cell skin cancer. Has seen them within a year or two- Dr. Elvera Lennox.  Encouraged him to get weight back into the 180s- cut down on portion size (double portions of spaghetti at lunch sometimes), cut down on candy intake  Advanced directives, counseling/discussion Brings document [place] decleration of desire for a natural death. If terminal or incurable condition- my physician may withold or discontinue either artificial nutrition or hydration or both. If my physician deterrmines that I am in a persistent vegetative state, I authorize- withholding or d/c extraordinary means or nutrition/hydration as well.   Still wants to remain full code at this time though as long as reasonable chance of recovery.   Wife HCPOA  Essential hypertension S: controlled on benazepril-hctz 20-25mg  as well as amlodipine 10mg . .  ASCVD 10 year risk calculation if age 82-79: outside age range BP Readings from Last 3 Encounters:  05/20/17 138/68  04/11/17 (!) 148/88  01/11/17 132/62  A/P: We discussed blood pressure goal of <140/90- technically could go up to 150 but prefer under 140 SBP if possible. Continue current meds  B12 deficiency S: B12 deficiency discovered by Dr. Posey Pronto with b12 normal but MMA elevated. He has been getting daily injections for 7 days then will transition to  once weekly for 4 weeks then monthly for 1 year.  A/P: continue plan as per Dr. Posey Pronto- receiving injections here. Next week will be weekly injection #2  Hyperlipidemia S: lipids have been elevated but outside clear benefit compared to risk range at his age Lab Results  Component Value Date   CHOL 209 (H) 05/18/2016   HDL 46.90 05/18/2016   LDLCALC 138 (H) 05/18/2016   LDLDIRECT 127.3 02/11/2010   TRIG 122.0 05/18/2016   CHOLHDL 4 05/18/2016  A/P: update lipids. Focus on healthy diet and exercise.   ERECTILE DYSFUNCTION S: Viagra prn- samples formerly. Soft erection not sure if can ejaculate- difficult to penetrate A/P: send in viagra as 100mg - may trial 1/2 tablet to start. May try full tablet. I may send in sildenafil/revatio if cost prohibiative   Hyperglycemia  3 straight 109-115 cbg over past years. Update a1c--> Diabetes mellitus (HCC) Lab Results  Component Value Date   HGBA1C 7.2 (H) 05/20/2017  new diagnosis. Will refer to diabetes education. Follow up 14 weeks. Consider metformin if not improved at that time  6 month. Changed to 14 weeks due to diabetes.   Orders Placed This Encounter  Procedures  . CBC    [place]  . Comprehensive metabolic panel    [place]    Order Specific Question:   Has the patient fasted?    Answer:   No  . Lipid panel    [place]    Order Specific Question:   Has the patient fasted?    Answer:   No  . Hemoglobin A1c    [place]   Meds ordered this encounter  Medications  . sildenafil (VIAGRA) 100 MG tablet    Sig: Take 1 tablet (100 mg total) by mouth daily as needed for erectile dysfunction.    Dispense:  10 tablet    Refill:  11   Return precautions advised.  Garret Reddish, MD

## 2017-05-20 NOTE — Assessment & Plan Note (Signed)
3 straight 109-115 cbg over past years. Update a1c

## 2017-05-25 ENCOUNTER — Ambulatory Visit: Payer: Medicare Other | Admitting: Neurology

## 2017-05-25 ENCOUNTER — Ambulatory Visit (INDEPENDENT_AMBULATORY_CARE_PROVIDER_SITE_OTHER): Payer: Medicare Other

## 2017-05-25 DIAGNOSIS — E538 Deficiency of other specified B group vitamins: Secondary | ICD-10-CM

## 2017-05-25 MED ORDER — CYANOCOBALAMIN 1000 MCG/ML IJ SOLN
1000.0000 ug | Freq: Once | INTRAMUSCULAR | Status: AC
  Administered 2017-05-25: 1000 ug via INTRAMUSCULAR

## 2017-05-31 ENCOUNTER — Encounter: Payer: Self-pay | Admitting: Psychology

## 2017-05-31 ENCOUNTER — Ambulatory Visit (INDEPENDENT_AMBULATORY_CARE_PROVIDER_SITE_OTHER): Payer: Medicare Other | Admitting: Psychology

## 2017-05-31 DIAGNOSIS — R413 Other amnesia: Secondary | ICD-10-CM | POA: Diagnosis not present

## 2017-05-31 NOTE — Progress Notes (Signed)
   Neuropsychology Note  Mitchell Walker came in today for 1 hour of neuropsychological testing with technician, Milana Kidney, BS, under the supervision of Dr. Macarthur Critchley. The patient did not appear overtly distressed by the testing session, per behavioral observation or via self-report to the technician. Rest breaks were offered. Mitchell Walker will return within 2 weeks for a feedback session with Dr. Si Raider at which time his test performances, clinical impressions and treatment recommendations will be reviewed in detail. The patient understands he can contact our office should he require our assistance before this time.  Full report to follow.

## 2017-05-31 NOTE — Progress Notes (Signed)
NEUROPSYCHOLOGICAL INTERVIEW (CPT: [id number])  Name: Mitchell Walker Date of Birth: [birth year] Date of Interview: 05/31/2017  Reason for Referral:  Mitchell Walker (goes by "Buddy") is a 83 y.o. right-handed male who is referred for neuropsychological evaluation by Dr. Narda Amber of [facility] due to concerns about memory changes. This patient is accompanied in the office by his wife Mitchell Walker who supplements the history.  History of Presenting Problem:  Mr. Mitchell Walker was seen for neurologic consultation by Dr. Posey Pronto on 04/11/2017. MOCA was 24/30. Labwork revealed vitamin B12 deficiency and B12 injections were started. MRI of the brain completed on 04/20/2017 revealed no acute intracranial abnormality; moderate for age chronic microvascular ischemic changes and moderate diffuse parenchymal volume loss of the brain was noted. His wife informs me he has also recently been diagnosed with diabetes by Dr. Yong Channel and will be meeting with a dietician soon.   The patient is unsure when he started experiencing cognitive decline. His wife reports noticing cognitive changes around 9-12 months ago with gradual onset and progressive worsening over time. She states the patient is "more directionally challenged now", frequently asking in the middle of a trip where they are going, and then forgetting en route again. He doesn't get lost but makes wrong turns. He continues to drive by himself on a regular basis but his wife is worried that he is going to get lost if she is not with him. He has not had any MVAs.  There is a strong family history of Alzheimer's disease and dementia, including both parents and many aunts and uncles on both sides.  Upon direct questioning, the patient and his wife reported the following with regard to current cognitive functioning:   Forgetting recent conversations/events: Yes Repeating statements/questions: Yes Misplacing/losing items: No  Forgetting appointments or other obligations: Yes,  but he writes everything on the calendar and takes a picture of it on his phone Forgetting to take medications: No  Difficulty concentrating: No Starting but not finishing tasks: Not really; he may leave a cabinet door open, won't quite complete the task Distracted easily: No Processing information more slowly: Yes  Word-finding difficulty: Normal degree Writing difficulty: No Spelling difficulty: No Comprehension difficulty: No  Getting lost when driving: No Making wrong turns when driving: Yes Uncertain about directions when driving or passenger: Yes   He continues to manage all instrumental ADLs. He fills his pill planner and takes his medications independently. He manages appointments independently. His wife has always done the cooking and managed the finances and bills.   He denies any significant physical complaints other than arthritis in his hands. He does not have any difficulty with walking or balance. He has not had any falls. He sleeps well. His appetite is good.  There is no psychiatric history. He has never sought or attained mental health treatment. He reports his current mood is good. His wife denies any significant change in behavior or personality. He denies suicidal ideation or intention.  He continues to enjoy fishing and yardwork. He used to be a cyclist but due to increased traffic in their neighborhood he is no longer able to ride his bicycle.   Social History: Born/RaisedGlass blower/designer, [place] Education: High school graduate Occupational history: Retired x18 years - worked for [employer] Marital history: Married 62 years, 2 kids, 4 grands Alcohol: 2 beers/day Used to drink more when he was younger but it was never a problem No liquor  Tobacco: Former smoker (quit 1973)   Medical History: Past  Medical History:  Diagnosis Date  . EXTERNAL HEMORRHOIDS, THROMBOSED 08/12/2010  . Hypertension   . Osteoarthritis   . Rosacea, acne   . ROTATOR CUFF TEAR 02/06/2008   Worked  through with PT     Recently diagnosed diabetes   Current Medications:  Outpatient Encounter Prescriptions as of 05/31/2017  Medication Sig  . amLODipine (NORVASC) 10 MG tablet Take 1 tablet (10 mg total) by mouth daily.  . benazepril-hydrochlorthiazide (LOTENSIN HCT) 20-25 MG tablet Take 1 tablet by mouth daily.  . Omega-3 Fatty Acids (FISH OIL) 1000 MG CAPS Take 1 capsule by mouth daily.  . sildenafil (VIAGRA) 100 MG tablet Take 1 tablet (100 mg total) by mouth daily as needed for erectile dysfunction.   No facility-administered encounter medications on file as of 05/31/2017.      Behavioral Observations:   Appearance: Neatly, casually and appropriately dressed and groomed Gait: Ambulated independently, no gross abnormalities observed Speech: Fluent; normal rate, rhythm and volume. No observed word finding difficulty during conversational speech. Thought process: Linear, goal directed Affect: Full, euthymic Interpersonal: Pleasant, appropriate   TESTING: There is medical necessity to proceed with neuropsychological assessment as the results will be used to aid in differential diagnosis and clinical decision-making and to inform specific treatment recommendations. Per the patient, his wife and medical records reviewed, there has been a change in cognitive functioning and a reasonable suspicion of dementia.  Following the clinical interview, the patient completed a full battery of neuropsychological testing with my psychometrician under my supervision.   PLAN: The patient will return to see me for a follow-up session at which time his test performances and my impressions and treatment recommendations will be reviewed in detail.  Full report to follow.

## 2017-06-01 ENCOUNTER — Ambulatory Visit (INDEPENDENT_AMBULATORY_CARE_PROVIDER_SITE_OTHER): Payer: Medicare Other | Admitting: Family Medicine

## 2017-06-01 DIAGNOSIS — E538 Deficiency of other specified B group vitamins: Secondary | ICD-10-CM | POA: Diagnosis not present

## 2017-06-01 MED ORDER — CYANOCOBALAMIN 1000 MCG/ML IJ SOLN
1000.0000 ug | Freq: Once | INTRAMUSCULAR | Status: AC
  Administered 2017-06-01: 1000 ug via INTRAMUSCULAR

## 2017-06-01 NOTE — Progress Notes (Signed)
Low b12 Lab Results  Component Value Date   [id number] 10/22/2016  continue monthly injections  I have reviewed and agree with note, evaluation, plan.   Garret Reddish, MD

## 2017-06-07 ENCOUNTER — Encounter: Payer: Medicare Other | Attending: Family Medicine | Admitting: Registered"

## 2017-06-07 ENCOUNTER — Encounter: Payer: Self-pay | Admitting: Registered"

## 2017-06-07 DIAGNOSIS — E119 Type 2 diabetes mellitus without complications: Secondary | ICD-10-CM | POA: Diagnosis not present

## 2017-06-07 DIAGNOSIS — Z713 Dietary counseling and surveillance: Secondary | ICD-10-CM | POA: Diagnosis not present

## 2017-06-07 NOTE — Patient Instructions (Signed)
Plan:  Aim for 2-3 Carb Choices per meal (30-45 grams) +/- 1 either way  Aim for 0-2 Carbs per snack if hungry  Include protein with your meals and snacks Consider reading food labels for Total Carbohydrate and Sat Fat Grams of foods Consider increasing daily activity level as tolerated

## 2017-06-07 NOTE — Progress Notes (Signed)
Diabetes Self-Management Education  Visit Type: First/Initial  Appt. Start Time: 1035 Appt. End Time: 9211  06/07/2017  Mr. Mitchell Walker, identified by name and date of birth, is a 83 y.o. male with a diagnosis of Diabetes: Type 2.   ASSESSMENT Pt states he was surprised to find out about the diagnosis. Pt and wife attended visit appear to be ready to make some changes which will help control blood sugar.  Pt's spouse states that their daughter is an OR nurse and told them to not eat  White food (potatoes, bread, etc) which they have cut back on quite a bit in the last 2 weeks.     Diabetes Self-Management Education - 06/07/17 1026      Visit Information   Visit Type First/Initial     Initial Visit   Diabetes Type Type 2   Are you currently following a meal plan? No   Are you taking your medications as prescribed? Yes  not on DM medcation   Date Diagnosed 05/2017     Health Coping   How would you rate your overall health? Excellent     Psychosocial Assessment   Patient Belief/Attitude about Diabetes Other (comment)  surprised   Other persons present Spouse/SO;Patient   How often do you need to have someone help you when you read instructions, pamphlets, or other written materials from your doctor or pharmacy? 1 - Never   What is the last grade level you completed in school? 94RD     Complications   Last HgB A1C per patient/outside source 7.2 %  05/2017 per chart   How often do you check your blood sugar? 0 times/day (not testing)   Number of hypoglycemic episodes per month 0   Have you had a dilated eye exam in the past 12 months? Yes   Have you had a dental exam in the past 12 months? Yes   Are you checking your feet? No     Dietary Intake   Breakfast none OR eggs, grits, coffee   Snack (morning) none   Lunch meat 2 veggies OR spaghetti   Snack (afternoon) none OR 1/2 Klondike (not for last 2 weeks)   Dinner sandwich OR activia yogurt OR cereal    Snack (evening) none    Beverage(s) 2 beer each night     Exercise   Exercise Type Moderate (swimming / aerobic walking)  yard work   How many days per week to you exercise? 2   How many minutes per day do you exercise? 60   Total minutes per week of exercise 120     Patient Education   Previous Diabetes Education No   Disease state  Definition of diabetes, type 1 and 2, and the diagnosis of diabetes;Factors that contribute to the development of diabetes   Nutrition management  Role of diet in the treatment of diabetes and the relationship between the three main macronutrients and blood glucose level;Food label reading, portion sizes and measuring food.;Carbohydrate counting   Physical activity and exercise  Role of exercise on diabetes management, blood pressure control and cardiac health.   Monitoring Identified appropriate SMBG and/or A1C goals.   Chronic complications Relationship between chronic complications and blood glucose control     Individualized Goals (developed by patient)   Nutrition Follow meal plan discussed   Physical Activity Exercise 5-7 days per week     Outcomes   Expected Outcomes Demonstrated interest in learning. Expect positive outcomes   Future DMSE PRN  Program Status Completed     Individualized Plan for Diabetes Self-Management Training:   Learning Objective:  Patient will have a greater understanding of diabetes self-management. Patient education plan is to attend individual and/or group sessions per assessed needs and concerns.   Patient Instructions  Plan:  Aim for 2-3 Carb Choices per meal (30-45 grams) +/- 1 either way  Aim for 0-2 Carbs per snack if hungry  Include protein with your meals and snacks Consider reading food labels for Total Carbohydrate and Sat Fat Grams of foods Consider increasing daily activity level as tolerated  Expected Outcomes:  Demonstrated interest in learning. Expect positive outcomes  Education material provided: Living Well with  Diabetes, A1C conversion sheet, My Plate and Carbohydrate counting sheet. ADA program enrollment instructions, Types of Fat  If problems or questions, patient to contact team via:  Phone  Future DSME appointment: PRN

## 2017-06-08 ENCOUNTER — Ambulatory Visit (INDEPENDENT_AMBULATORY_CARE_PROVIDER_SITE_OTHER): Payer: Medicare Other

## 2017-06-08 DIAGNOSIS — E538 Deficiency of other specified B group vitamins: Secondary | ICD-10-CM

## 2017-06-08 MED ORDER — CYANOCOBALAMIN 1000 MCG/ML IJ SOLN
1000.0000 ug | Freq: Once | INTRAMUSCULAR | Status: AC
  Administered 2017-06-08: 1000 ug via INTRAMUSCULAR

## 2017-06-21 NOTE — Progress Notes (Signed)
NEUROPSYCHOLOGICAL EVALUATION   Name:    Mitchell Walker  Date of Birth:   1934/07/08 Date of Interview:  05/31/2017 Date of Testing:  05/31/2017   Date of Feedback:  06/23/2017       Background Information:  Reason for Referral:  KELSIE KRAMP (goes by "Buddy") is a 83 y.o. right-handed male referred by Dr. Narda Amber to assess his current level of cognitive functioning and assist in differential diagnosis. The current evaluation consisted of a review of available medical records, an interview with the patient and wife, [name], and the completion of a neuropsychological testing battery. Informed consent was obtained.  History of Presenting Problem:  Mr. Reppond was seen for neurologic consultation by Dr. Posey Pronto on 04/11/2017. MOCA was 24/30. Labwork revealed vitamin B12 deficiency and B12 injections were started. MRI of the brain completed on 04/20/2017 revealed no acute intracranial abnormality; moderate for age chronic microvascular ischemic changes and moderate diffuse parenchymal volume loss of the brain was noted. His wife informs me he has also recently been diagnosed with diabetes by Dr. Yong Channel and will be meeting with a dietician soon.   The patient is unsure when he started experiencing cognitive decline. His wife reports noticing cognitive changes around 9-12 months ago with gradual onset and progressive worsening over time. She states the patient is "more directionally challenged now", frequently asking in the middle of a trip where they are going, and then forgetting en route again. He doesn't get lost but makes wrong turns. He continues to drive by himself on a regular basis but his wife is worried that he is going to get lost if she is not with him. He has not had any MVAs.  There is a strong family history of Alzheimer's disease and dementia, including both parents and many aunts and uncles on both sides.  Upon direct questioning, the patient and his wife reported the following with  regard to current cognitive functioning:   Forgetting recent conversations/events: Yes Repeating statements/questions: Yes Misplacing/losing items: No  Forgetting appointments or other obligations: Yes, but he writes everything on the calendar and takes a picture of it on his phone Forgetting to take medications: No  Difficulty concentrating: No Starting but not finishing tasks: Not really; he may leave a cabinet door open, won't quite complete the task Distracted easily: No Processing information more slowly: Yes  Word-finding difficulty: Normal degree Writing difficulty: No Spelling difficulty: No Comprehension difficulty: No  Getting lost when driving: No Making wrong turns when driving: Yes Uncertain about directions when driving or passenger: Yes   He continues to manage all instrumental ADLs. He fills his pill planner and takes his medications independently. He manages appointments independently. His wife has always done the cooking and managed the finances and bills.   He denies any significant physical complaints other than arthritis in his hands. He does not have any difficulty with walking or balance. He has not had any falls. He sleeps well. His appetite is good.  There is no psychiatric history. He has never sought or attained mental health treatment. He reports his current mood is good. His wife denies any significant change in behavior or personality. He denies suicidal ideation or intention.  He continues to enjoy fishing and yardwork. He used to be a cyclist but due to increased traffic in their neighborhood he is no longer able to ride his bicycle.   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 EXTERNAL HEMORRHOIDS, THROMBOSED 08/12/2010  . Hypertension   . Osteoarthritis   . Rosacea, acne   . ROTATOR CUFF TEAR 02/06/2008   Worked through with PT      Current medications:  Outpatient Encounter Prescriptions as of 06/23/2017  Medication Sig  . amLODipine (NORVASC) 10 MG tablet Take 1 tablet (10 mg total) by mouth daily.  . benazepril-hydrochlorthiazide (LOTENSIN HCT) 20-25 MG tablet Take 1 tablet by mouth daily.  . Omega-3 Fatty Acids (FISH OIL) 1000 MG CAPS Take 1 capsule by mouth daily.  . sildenafil (VIAGRA) 100 MG tablet Take 1 tablet (100 mg total) by mouth daily as needed for erectile dysfunction.   No facility-administered encounter medications on file as of 06/23/2017.      Current Examination:  Behavioral Observations:   Appearance: Neatly, casually and appropriately dressed and groomed Gait: Ambulated independently, no gross abnormalities observed Speech: Fluent; normal rate, rhythm and volume. No observed word finding difficulty during conversational speech. Thought process: Linear, goal directed Affect: Full, euthymic Interpersonal: Pleasant, appropriate Orientation: Oriented to all spheres. Accurately named the current President and his predecessor.   Tests Administered: . Test of Premorbid Functioning (TOPF) . Wechsler Adult Intelligence Scale-Fourth Edition (WAIS-IV): Similarities, Music therapist, Coding and Digit Span subtests . Wechsler Memory Scale-Fourth Edition (WMS-IV) Older Adult Version (ages 47-90): Logical Memory I, II and Recognition subtests  . [employer] Verbal Learning Test - 2nd Edition (CVLT-2) Short Form . Repeatable Battery for the Assessment of Neuropsychological Status (RBANS) Form A:  Figure Copy and Recall subtests and Semantic Fluency subtest . Neuropsychological Assessment Battery (NAB) Language Module, Form 1: Naming subtest . Boston Diagnostic Aphasia  Examination: Complex Ideational Material subtest . Controlled Oral Word Association Test (COWAT) . Trail Making Test A and B . Clock drawing test . Geriatric Depression Scale (GDS) 15 Item . Generalized Anxiety Disorder - 7 item screener (GAD-7)  Test Results: Note: Standardized scores are presented only for use by appropriately trained professionals and to allow for any future test-retest comparison. These scores should not be interpreted without consideration of all the information that is contained in the rest of the report. The most recent standardization samples from the test publisher or other sources were used whenever possible to derive standard scores; scores were corrected for age, gender, ethnicity and education when available.   Test Scores:  Test Name Raw Score Standardized Score Descriptor  TOPF 32/70 SS= 95 Average  WAIS-IV Subtests     Similarities 27/36 ss= 13 High average  Block Design 34/66 ss= 13 High average  Coding 47/135 ss= 12 High average  Digit Span Forward 11/16 ss= 12 High average  Digit Span Backward 9/16 ss= 13 High average  WMS-IV Subtests     LM I 28/53 ss= 10 Average  LM II 9/39 ss= 8 Average  LM II Recognition 21/23 Cum %: >75 Above average  RBANS Subtests     Figure Copy 20/20 Z= 1.4 Superior  Figure Recall 13/20 Z= 0.4 Average  Semantic Fluency 18/40 Z= 0.2 Average  CVLT-II Scores     Trial 1 6/9 Z= 1.5 Superior  Trial 4 8/9 Z= 2 Very superior  Trials 1-4 total 28/36 T= 73 Very superior  SD Free Recall 3/9 Z= -1.5 Borderline  LD Free Recall 4/9 Z= 0 Average  LD Cued Recall 5/9 Z= 0 Average  Recognition Discriminability 8/9 hits, 1 false positives Z= 1 High average  Forced Choice Recognition 9/9  WNL  NAB Language subtests     Naming 31/31 T= 63 High average  BDAE Subtest     Complex Ideational Material 12/12  WNL  COWAT-FAS 45 T= 63 High average  COWAT-Animals 14 T= 50 Average  Trail Making Test A  34" 0 errors T= 60 High average    Trail Making Test B  73" 0 errors T= 60 High average  Clock Drawing   WNL  GDS-15 1/15  WNL  GAD-7 0/21  WNL      Description of Test Results:  Premorbid verbal intellectual abilities were estimated to have been within the average range based on a test of word reading. Psychomotor processing speed was high average. Auditory attention and working memory were high average. Visual-spatial construction was high average to superior. Language abilities were intact. Specifically, confrontation naming was high average, and semantic verbal fluency was average. Auditory comprehension of complex ideational material was intact. With regard to verbal memory, encoding and acquisition of non-contextual information (i.e., word list) was very superior. After a brief distracter task, free recall was borderline (3/9 items recalled). After a delay, free recall was average (4/9 items recalled). Cued recall was average (5/9 items recalled). Performance on a yes/no recognition task was high average. On another verbal memory test, encoding and acquisition of contextual auditory information (i.e., short stories) was average. After a delay, free recall was average. Performance on a yes/no recognition task was above average. With regard to non-verbal memory, delayed free recall of visual information was average. Executive functioning was intact overall. Mental flexibility and set-shifting were high average on Trails B. Verbal fluency with phonemic search restrictions was high average. Verbal abstract reasoning was high average. Performance on a clock drawing task was intact. On self-report measures of mood, the patient's responses were not indicative of clinically significant depression or anxiety at the present time.    Clinical Impressions: Amnestic mild cognitive impairment. Results of the current cognitive evaluation are largely within normal limits, with most areas of function consistent with estimated premorbid  intellectual abilities and above average for his age. However, he demonstrated mild retrieval difficulty on one verbal memory test. His test results certainly do not warrant a diagnosis of dementia; however, a diagnosis mild cognitive impairment is appropriate at this time. 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 Fortunately he is not demonstrating decline in any other areas other than verbal memory, so if this is AD, I believe it is the very earliest stages. There is no indication of a primary psychiatric disorder.   Recommendations: Based on the findings of the present evaluation, the following recommendations are offered:  --Information on MCI was provided to the patient and his wife (including written information). --Optimal control of vascular risk factors (including recently diagnosed diabetes) is recommended in order to reduce the risk of further cognitive decline.  --He should continue using compensatory strategies like reminders and pictures of his calendar. --He should continue to participate in activities that provide mental stimulation and social interaction. --Neuropsychological re-assessment in one year is recommended in order to monitor cognitive status, track any progression of symptoms and further assist with treatment planning.     Feedback to Patient: ARGELIO GRANIER returned for a feedback appointment on 06/23/2017  to review the results of his neuropsychological evaluation with this provider. 15 minutes face-to-face time was spent reviewing his test results, my impressions and my recommendations as detailed above.    Total time spent on this patient's case: 90791x1 unit for interview with psychologist; [id number] units of testing by psychometrician under psychologist's supervision; [phone] units for medical record review, scoring of neuropsychological tests, interpretation of test results, preparation of this report, and review of results to the patient by  psychologist.      Thank you for your referral of CHRISTY FRIEDE. Please feel free to contact me if you have any questions or concerns regarding this report.

## 2017-06-23 ENCOUNTER — Encounter: Payer: Self-pay | Admitting: Psychology

## 2017-06-23 ENCOUNTER — Ambulatory Visit (INDEPENDENT_AMBULATORY_CARE_PROVIDER_SITE_OTHER): Payer: Medicare Other | Admitting: Psychology

## 2017-06-23 DIAGNOSIS — R413 Other amnesia: Secondary | ICD-10-CM

## 2017-06-23 DIAGNOSIS — G3184 Mild cognitive impairment, so stated: Secondary | ICD-10-CM

## 2017-06-29 ENCOUNTER — Ambulatory Visit: Payer: Medicare Other

## 2017-07-06 ENCOUNTER — Ambulatory Visit: Payer: Medicare Other

## 2017-07-06 ENCOUNTER — Ambulatory Visit (INDEPENDENT_AMBULATORY_CARE_PROVIDER_SITE_OTHER): Payer: Medicare Other | Admitting: Family Medicine

## 2017-07-06 DIAGNOSIS — E538 Deficiency of other specified B group vitamins: Secondary | ICD-10-CM | POA: Diagnosis not present

## 2017-07-06 MED ORDER — CYANOCOBALAMIN 1000 MCG/ML IJ SOLN
1000.0000 ug | Freq: Once | INTRAMUSCULAR | Status: AC
  Administered 2017-07-06: 1000 ug via INTRAMUSCULAR

## 2017-08-10 ENCOUNTER — Ambulatory Visit (INDEPENDENT_AMBULATORY_CARE_PROVIDER_SITE_OTHER): Payer: Medicare Other

## 2017-08-10 DIAGNOSIS — E538 Deficiency of other specified B group vitamins: Secondary | ICD-10-CM | POA: Diagnosis not present

## 2017-08-10 MED ORDER — CYANOCOBALAMIN 1000 MCG/ML IJ SOLN
1000.0000 ug | Freq: Once | INTRAMUSCULAR | Status: AC
  Administered 2017-08-10: 1000 ug via INTRAMUSCULAR

## 2017-08-10 NOTE — Progress Notes (Signed)
Pt came in for his b12 injection. Pt tolerated his injection well

## 2017-08-12 ENCOUNTER — Encounter: Payer: Self-pay | Admitting: Neurology

## 2017-08-12 ENCOUNTER — Ambulatory Visit (INDEPENDENT_AMBULATORY_CARE_PROVIDER_SITE_OTHER): Payer: Medicare Other | Admitting: Neurology

## 2017-08-12 VITALS — BP 140/70 | HR 71 | Ht 67.0 in | Wt 185.2 lb

## 2017-08-12 DIAGNOSIS — E538 Deficiency of other specified B group vitamins: Secondary | ICD-10-CM | POA: Diagnosis not present

## 2017-08-12 DIAGNOSIS — G3184 Mild cognitive impairment, so stated: Secondary | ICD-10-CM | POA: Diagnosis not present

## 2017-08-12 NOTE — Patient Instructions (Signed)
Continue monthly vitamin B12 injections  Try to start an exercise program  Return to clinic in May 2019

## 2017-08-12 NOTE — Progress Notes (Signed)
Follow-up Visit   Date: 08/12/17    Mitchell Walker MRN: [id number] DOB: 09/19/1934   Interim History: Mitchell Walker is a 83 y.o. right-handed Caucasian male with hypertension returning to the clinic for follow-up of mild cognitive impairment.  The patient was accompanied to the clinic by wife who also provides collateral information.    History of present illness: Starting around late 2017, his wife began noticing that when he drives, he forgets which roads to turn on and she thinks he may be distracted.  He forgets appointments and always repeats confirming the details. She often has to tell him the same information, or he quickly forgets; for instance, when driving, she will tell him to turn on a specific street, but he would forget this unless, she keeps reminding him or tells him with to turn.   He rarely misplaces objects.  He denies any problems with managing finances or medications.  He has some difficulty with word-finding, but not often.  He gets turned around when driving, and uses a GPS as needed. His wife stays that he drives too fast and needs to slow down, but she denies any safety concerns.  He denies difficulty recognizing faces, but has problems with recalling names.    He drinks 2-3 beers nightly for the past 50 years. He denies paresthesias of the feet.  He stays very active socially and started riding a bike lately with friends.   UPDATE 08/12/2017:   He is here for follow-up visit and reports doing well. 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  His wife states that he continues to be forgetful and sometimes looses track when driving, but still able to manage day-to-day activities and such as finances, medications, and decision-making.  He stay very active in the community.  He has enrolled in [email or web address] and has been doing well with his cognitive exercises.   He was recently diagnosed with diabetes and has been managing this with life  style modification and has lost 10lb over 4 months.     Medications:  Current Outpatient Prescriptions on File Prior to Visit  Medication Sig Dispense Refill  . amLODipine (NORVASC) 10 MG tablet Take 1 tablet (10 mg total) by mouth daily. 91 tablet 3  . benazepril-hydrochlorthiazide (LOTENSIN HCT) 20-25 MG tablet Take 1 tablet by mouth daily. 91 tablet 3  . Omega-3 Fatty Acids (FISH OIL) 1000 MG CAPS Take 1 capsule by mouth daily.    . sildenafil (VIAGRA) 100 MG tablet Take 1 tablet (100 mg total) by mouth daily as needed for erectile dysfunction. 10 tablet 11   No current facility-administered medications on file prior to visit.     Allergies:  Allergies  Allergen Reactions  . Celecoxib     REACTION: Upset GI  . Sulfonamide Derivatives     REACTION: rash    Review of Systems:  CONSTITUTIONAL: No fevers, chills, night sweats, or weight loss.  EYES: No visual changes or eye pain ENT: No hearing changes.  No history of nose bleeds.   RESPIRATORY: No cough, wheezing and shortness of breath.   CARDIOVASCULAR: Negative for chest pain, and palpitations.   GI: Negative for abdominal discomfort, blood in stools or black stools.  No recent change in bowel habits.   GU:  No history of incontinence.   MUSCLOSKELETAL: No history of joint pain or swelling.  No myalgias.   SKIN: Negative for lesions, rash, and itching.   ENDOCRINE: Negative for cold or  heat intolerance, polydipsia or goiter.   PSYCH:  No depression or anxiety symptoms.   NEURO: As Above.   Vital Signs:  BP 140/70   Pulse 71   Ht 5\' 7"  (1.702 m)   Wt 185 lb 3 oz (84 kg)   SpO2 96%   BMI 29.00 kg/m    General: Well appearing, comfortable  Neurological Exam: MENTAL STATUS including orientation to time, place, person, recent and remote memory, attention span and concentration, language, and fund of knowledge is normal.  Speech is not dysarthric.  CRANIAL NERVES: Pupils equal round and reactive to light.  Normal  conjugate, extra-ocular eye movements in all directions of gaze.  No ptosis. Normal facial sensation.  Face is symmetric. Palate elevates symmetrically.  Tongue is midline.  MOTOR:  Motor strength is 5/5 in all extremities.  No pronator drift.  Tone is normal.    COORDINATION/GAIT:  Normal finger-to- nose-finger.   Gait narrow based and stable.   Data: Labs 10/22/2016:  Vitamin B12 422, RPR neg, TSH 2.34, HIV NR  Neuropsychological testing 06/23/2017:  Amnestic mild cognitive impairment  IMPRESSION/PLAN: 1.  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  Vitamin B12 deficiency  - Continue monthly B12 injections  3.  Alcohol use  - Recommend trying to cut back on his intake  Return to clinic in 8 months   The duration of this appointment visit was 20 minutes of face-to-face time with the patient.  Greater than 50% of this time was spent in counseling, explanation of diagnosis, planning of further management, and coordination of care.   Thank you for allowing me to participate in patient's care.  If I can answer any additional questions, I would be pleased to do so.    Sincerely,    Ethyn Schetter K. Posey Pronto, DO

## 2017-08-24 ENCOUNTER — Ambulatory Visit (INDEPENDENT_AMBULATORY_CARE_PROVIDER_SITE_OTHER): Payer: Medicare Other | Admitting: Family Medicine

## 2017-08-24 ENCOUNTER — Encounter: Payer: Self-pay | Admitting: Family Medicine

## 2017-08-24 VITALS — BP 130/68 | HR 68 | Temp 98.0°F | Ht 67.0 in | Wt 187.6 lb

## 2017-08-24 DIAGNOSIS — F528 Other sexual dysfunction not due to a substance or known physiological condition: Secondary | ICD-10-CM | POA: Diagnosis not present

## 2017-08-24 DIAGNOSIS — E785 Hyperlipidemia, unspecified: Secondary | ICD-10-CM | POA: Diagnosis not present

## 2017-08-24 DIAGNOSIS — E119 Type 2 diabetes mellitus without complications: Secondary | ICD-10-CM

## 2017-08-24 DIAGNOSIS — I10 Essential (primary) hypertension: Secondary | ICD-10-CM | POA: Diagnosis not present

## 2017-08-24 DIAGNOSIS — G3184 Mild cognitive impairment, so stated: Secondary | ICD-10-CM | POA: Diagnosis not present

## 2017-08-24 DIAGNOSIS — E538 Deficiency of other specified B group vitamins: Secondary | ICD-10-CM

## 2017-08-24 LAB — POCT GLYCOSYLATED HEMOGLOBIN (HGB A1C): HEMOGLOBIN A1C: 6.2

## 2017-08-24 MED ORDER — TETANUS-DIPHTH-ACELL PERTUSSIS 5-2.5-18.5 LF-MCG/0.5 IM SUSP: INTRAMUSCULAR | 0 refills | Status: DC

## 2017-08-24 NOTE — Assessment & Plan Note (Signed)
S: mild poorly controlled on no statin but no clear strong benefit compared to risk at his age for primay prevention- last lipids had improved though with LDL down 20 points and total down 29. Has lost 8 lbs sine diagnosis and is back in 180s Lab Results  Component Value Date   CHOL 180 05/20/2017   HDL 40.80 05/20/2017   LDLCALC 118 (H) 05/20/2017   LDLDIRECT 127.3 02/11/2010   TRIG 102.0 05/20/2017   CHOLHDL 4 05/20/2017   A/P: hopeful will continue to improve with improved diet

## 2017-08-24 NOTE — Progress Notes (Signed)
Subjective:  Mitchell Walker is a 83 y.o. year old very pleasant male patient who presents for/with See problem oriented charting ROS- no hypoglycemia. No chest pain or shortness of breath. No headache or blurry vision.    Past Medical History-  Patient Active Problem List   Diagnosis Date Noted  . Advanced directives, counseling/discussion 05/20/2017    Priority: High  . Diabetes mellitus ([place]) 05/20/2017    Priority: High  . B12 deficiency 05/02/2017    Priority: High  . MCI (mild cognitive impairment) 10/22/2016    Priority: High  . Hyperlipidemia 02/03/2009    Priority: Medium  . Essential hypertension 07/17/2007    Priority: Medium  . Former smoker 05/21/2015    Priority: Low  . Adenomatous colon polyp 05/21/2015    Priority: Low  . Chronic frontal sinusitis 12/30/2010    Priority: Low  . ACNE ROSACEA 04/15/2010    Priority: Low  . Actinic keratosis 02/03/2009    Priority: Low  . Basal cell carcinoma of skin 02/29/2008    Priority: Low  . ERECTILE DYSFUNCTION 12/11/2007    Priority: Low  . Osteoarthritis 07/17/2007    Priority: Low    Medications- reviewed and updated Current Outpatient Prescriptions  Medication Sig Dispense Refill  . amLODipine (NORVASC) 10 MG tablet Take 1 tablet (10 mg total) by mouth daily. 91 tablet 3  . benazepril-hydrochlorthiazide (LOTENSIN HCT) 20-25 MG tablet Take 1 tablet by mouth daily. 91 tablet 3  . Omega-3 Fatty Acids (FISH OIL) 1000 MG CAPS Take 1 capsule by mouth daily.    . sildenafil (VIAGRA) 100 MG tablet Take 1 tablet (100 mg total) by mouth daily as needed for erectile dysfunction. (Patient not taking: Reported on 08/24/2017) 10 tablet 11  . Tdap (Albers) 5-2.5-18.5 LF-MCG/0.5 injection Please provide one Tdap to patient then notify our office. 0.5 mL 0   No current facility-administered medications for this visit.     Objective: BP 130/68 (BP Location: Left Arm, Patient Position: Sitting, Cuff Size: Large)   Pulse 68    Temp 98 F (36.7 C) (Oral)   Ht 5\' 7"  (1.702 m)   Wt 187 lb 9.6 oz (85.1 kg)   SpO2 92%   BMI 29.38 kg/m  Gen: NAD, resting comfortably nonlabored breathing  Diabetic Foot Exam - Simple   Simple Foot Form Diabetic Foot exam was performed with the following findings:  Yes 08/24/2017  8:35 AM  Visual Inspection No deformities, no ulcerations, no other skin breakdown bilaterally:  Yes Sensation Testing Intact to touch and monofilament testing bilaterally:  Yes Pulse Check Posterior Tibialis and Dorsalis pulse intact bilaterally:  Yes Comments    Assessment/Plan:  Printed last letter from GI Printed Tdap- about to have greatgrandchild  Hypertension S: controlled on benazepril HCT 20-25mg  and amlodipine 10mg . Prefer <140 but per [facility] could go to 416 systolic.  BP Readings from Last 3 Encounters:  08/24/17 130/68  08/12/17 140/70  05/20/17 138/68  A/P: We discussed blood pressure goal of <140/90. Continue current meds  B12 deficiency S: He is now onto monthly injections of b12- discovered to be low by Dr. Posey Pronto due to elevated MMA.  A/P: continue monthly injetions for a year so through at least may 2019. Then consider oral.   Hyperlipidemia S: mild poorly controlled on no statin but no clear strong benefit compared to risk at his age for primay prevention- last lipids had improved though with LDL down 20 points and total down 29. Has lost 8 lbs  sine diagnosis and is back in 180s Lab Results  Component Value Date   CHOL 180 05/20/2017   HDL 40.80 05/20/2017   LDLCALC 118 (H) 05/20/2017   LDLDIRECT 127.3 02/11/2010   TRIG 102.0 05/20/2017   CHOLHDL 4 05/20/2017   A/P: hopeful will continue to improve with improved diet  ERECTILE DYSFUNCTION S:  tral viagra 100mg  last visit- advised to start with half tablet. Was to try sildenafil generic if too costly.  A/P: he states was too costly but does not want to trial sildenafil at present   Diabetes mellitus ([place]) S: poorly  controlled. On no rx at last check. Referred to diabetes education -went to dietician. Walking at [employer] daily. Cut out candy and bread and potatoes as well as portion size.  Lab Results  Component Value Date   HGBA1C 6.2 08/24/2017   HGBA1C 7.2 (H) 05/20/2017   A/P: drastic improvement with a1c down to 6.2. Will watch closely with 4 month follow up. Advised in January to inform optho of diabetes. Foot exam normal today  Future Appointments Date Time Provider [place]  09/08/2017 9:00 AM [facility]  04/24/2018 9:30 AM Narda Amber K, DO [facility] None   Return in about 4 months (around 12/24/2017).  Orders Placed This Encounter  Procedures  . POCT glycosylated hemoglobin (Hb A1C)   Meds ordered this encounter  Medications  . Tdap (BOOSTRIX) 5-2.5-18.5 LF-MCG/0.5 injection    Sig: Please provide one Tdap to patient then notify our office.    Dispense:  0.5 mL    Refill:  0    Return precautions advised.  Garret Reddish, MD

## 2017-08-24 NOTE — Assessment & Plan Note (Signed)
S: poorly controlled. On no rx at last check. Referred to diabetes education -went to dietician. Walking at [employer] daily. Cut out candy and bread and potatoes as well as portion size.  Lab Results  Component Value Date   HGBA1C 6.2 08/24/2017   HGBA1C 7.2 (H) 05/20/2017   A/P: drastic improvement with a1c down to 6.2. Will watch closely with 4 month follow up. Advised in January to inform optho of diabetes. Foot exam normal today

## 2017-08-24 NOTE — Patient Instructions (Addendum)
Lab Results  Component Value Date   HGBA1C 6.2 08/24/2017  Goal is 7 or less always. We prefer under 6.5 so you are at goal for both. This means your average blood sugar is around 120 down from over 150.   Everything else looks good   When you see your eye doctor in January make sure they know you now have diabetes- they do a special exam which is important to do yearly.   Flu shot this fall please  .let us know when you get Tdap

## 2017-08-24 NOTE — Assessment & Plan Note (Signed)
S:  tral viagra 100mg  last visit- advised to start with half tablet. Was to try sildenafil generic if too costly.  A/P: he states was too costly but does not want to trial sildenafil at present

## 2017-09-08 ENCOUNTER — Ambulatory Visit (INDEPENDENT_AMBULATORY_CARE_PROVIDER_SITE_OTHER): Payer: Medicare Other | Admitting: *Deleted

## 2017-09-08 DIAGNOSIS — E538 Deficiency of other specified B group vitamins: Secondary | ICD-10-CM | POA: Diagnosis not present

## 2017-09-08 MED ORDER — CYANOCOBALAMIN 1000 MCG/ML IJ SOLN
1000.0000 ug | Freq: Once | INTRAMUSCULAR | Status: AC
  Administered 2017-09-08: 1000 ug via INTRAMUSCULAR

## 2017-09-29 DIAGNOSIS — Z23 Encounter for immunization: Secondary | ICD-10-CM | POA: Diagnosis not present

## 2017-10-10 ENCOUNTER — Ambulatory Visit (INDEPENDENT_AMBULATORY_CARE_PROVIDER_SITE_OTHER): Payer: Medicare Other | Admitting: *Deleted

## 2017-10-10 DIAGNOSIS — E538 Deficiency of other specified B group vitamins: Secondary | ICD-10-CM

## 2017-10-10 MED ORDER — CYANOCOBALAMIN 1000 MCG/ML IJ SOLN
1000.0000 ug | Freq: Once | INTRAMUSCULAR | Status: AC
  Administered 2017-10-10: 1000 ug via INTRAMUSCULAR

## 2017-10-10 NOTE — Progress Notes (Signed)
Pt here today for B12 Injection. Pt tolerated well and knows to return in one month for next injection.

## 2017-10-10 NOTE — Progress Notes (Signed)
I have reviewed and agree with note, evaluation, plan.   Stephen Hunter, MD  

## 2017-10-17 ENCOUNTER — Ambulatory Visit (INDEPENDENT_AMBULATORY_CARE_PROVIDER_SITE_OTHER): Payer: Medicare Other

## 2017-10-17 DIAGNOSIS — Z23 Encounter for immunization: Secondary | ICD-10-CM

## 2017-11-10 ENCOUNTER — Ambulatory Visit (INDEPENDENT_AMBULATORY_CARE_PROVIDER_SITE_OTHER): Payer: Medicare Other | Admitting: *Deleted

## 2017-11-10 DIAGNOSIS — E538 Deficiency of other specified B group vitamins: Secondary | ICD-10-CM | POA: Diagnosis not present

## 2017-11-10 MED ORDER — CYANOCOBALAMIN 1000 MCG/ML IJ SOLN
1000.0000 ug | Freq: Once | INTRAMUSCULAR | Status: AC
  Administered 2017-11-10: 1000 ug via INTRAMUSCULAR

## 2017-11-10 NOTE — Progress Notes (Signed)
I have reviewed and agree with note, evaluation, plan.   Barry Faircloth, MD  

## 2017-11-10 NOTE — Progress Notes (Signed)
Patient came in for monthly B12 injection. Tolerated well. Knows to return in 1 month for repeat injection.  Pre-visit review using our clinic review tool, if applicable. No additional management support is needed unless otherwise documented below in the visit note.

## 2017-11-23 ENCOUNTER — Ambulatory Visit: Payer: Medicare Other | Admitting: Family Medicine

## 2017-12-02 ENCOUNTER — Ambulatory Visit: Payer: Medicare Other

## 2017-12-07 ENCOUNTER — Ambulatory Visit (INDEPENDENT_AMBULATORY_CARE_PROVIDER_SITE_OTHER): Payer: Medicare Other | Admitting: Surgical

## 2017-12-07 DIAGNOSIS — E538 Deficiency of other specified B group vitamins: Secondary | ICD-10-CM | POA: Diagnosis not present

## 2017-12-07 MED ORDER — CYANOCOBALAMIN 1000 MCG/ML IJ SOLN
1000.0000 ug | Freq: Once | INTRAMUSCULAR | Status: AC
  Administered 2017-12-07: 1000 ug via INTRAMUSCULAR

## 2017-12-07 NOTE — Progress Notes (Signed)
Patient came in today for his monthly B 12 injection. Injection was given in left deltoid. Patient tolerated well.

## 2017-12-15 DIAGNOSIS — H02055 Trichiasis without entropian left lower eyelid: Secondary | ICD-10-CM | POA: Diagnosis not present

## 2017-12-15 DIAGNOSIS — H0100B Unspecified blepharitis left eye, upper and lower eyelids: Secondary | ICD-10-CM | POA: Diagnosis not present

## 2017-12-15 DIAGNOSIS — H25813 Combined forms of age-related cataract, bilateral: Secondary | ICD-10-CM | POA: Diagnosis not present

## 2017-12-15 DIAGNOSIS — H52203 Unspecified astigmatism, bilateral: Secondary | ICD-10-CM | POA: Diagnosis not present

## 2017-12-15 LAB — HM DIABETES EYE EXAM

## 2017-12-26 NOTE — Progress Notes (Signed)
Subjective:  Mitchell Walker is a 84 y.o. year old very pleasant male patient who presents for/with See problem oriented charting ROS- no chest pain or shortness of breath. No hypoglycemia.    Past Medical History-  Patient Active Problem List   Diagnosis Date Noted  . Advanced directives, counseling/discussion 05/20/2017    Priority: High  . Diabetes mellitus ([place]) 05/20/2017    Priority: High  . B12 deficiency 05/02/2017    Priority: High  . MCI (mild cognitive impairment) 10/22/2016    Priority: High  . Glaucoma 12/27/2017    Priority: Medium  . Hyperlipidemia 02/03/2009    Priority: Medium  . Essential hypertension 07/17/2007    Priority: Medium  . Former smoker 05/21/2015    Priority: Low  . Adenomatous colon polyp 05/21/2015    Priority: Low  . Chronic frontal sinusitis 12/30/2010    Priority: Low  . ACNE ROSACEA 04/15/2010    Priority: Low  . Actinic keratosis 02/03/2009    Priority: Low  . Basal cell carcinoma of skin 02/29/2008    Priority: Low  . ERECTILE DYSFUNCTION 12/11/2007    Priority: Low  . Osteoarthritis 07/17/2007    Priority: Low    Medications- reviewed and updated Current Outpatient Medications  Medication Sig Dispense Refill  . amLODipine (NORVASC) 10 MG tablet Take 1 tablet (10 mg total) by mouth daily. 91 tablet 3  . benazepril-hydrochlorthiazide (LOTENSIN HCT) 20-25 MG tablet Take 1 tablet by mouth daily. 91 tablet 3  . Omega-3 Fatty Acids (FISH OIL) 1000 MG CAPS Take 1 capsule by mouth daily.     No current facility-administered medications for this visit.     Objective: BP 116/60 (BP Location: Left Arm, Patient Position: Sitting, Cuff Size: Large)   Pulse 64   Temp (!) 97.4 F (36.3 C) (Oral)   Ht 5\' 7"  (1.702 m)   Wt 180 lb 9.6 oz (81.9 kg)   SpO2 93%   BMI 28.29 kg/m  Gen: NAD, resting comfortably CV: RRR no murmurs rubs or gallops Lungs: CTAB no crackles, wheeze, rhonchi Abdomen: soft/nontender/nondistended/normal bowel sounds.  overweight Ext: no edema Skin: warm, dry Neuro: looks to wife for some items of recollection  Assessment/Plan:  Other issues 1. Printed letter from GI last visit - 11/2015- encouraged him to call- printed again 2. Printed Tdap last visit- was about to have greatgrandchild. Has had this  Hypertension S: controlled on benazepril HCT 20-25mg  and amlodipine 10mg   BP Readings from Last 3 Encounters:  12/27/17 116/60  08/24/17 130/68  08/12/17 140/70  A/P: We discussed blood pressure goal of <140/90. Continue current meds  B12 deficiency S: continued b12 injections monthly. Dr. Posey Pronto detected due to high MMA. Plan is to continue at least through april 2019 and then consider orals A/P: update b12, consider orals at next visit  Hyperlipidemia S: mild poorly controlled on no rx- at his age not clear benefits outweigh risks. No myalgias.  Lab Results  Component Value Date   CHOL 180 05/20/2017   HDL 40.80 05/20/2017   LDLCALC 118 (H) 05/20/2017   LDLDIRECT 127.3 02/11/2010   TRIG 102.0 05/20/2017   CHOLHDL 4 05/20/2017   A/P:  continue to monitor at least yearly. Last visit he was down 8 lbs. Has lost a few more lbs today- recheck lipids next time. Trying to help appetite by doing small amount of roasted nuts at night. Hopeful lipids even better next visit - has done better since diabetes education  Diabetes mellitus ([place]) S:  controlled. On last visit without medication after going to diabetes education. Walking at [employer] daily before (now at least most days) and staying low on candy, bread, potatoes and portion size in general- has slipped up some more Lab Results  Component Value Date   HGBA1C 6.2 08/24/2017   HGBA1C 7.2 (H) 05/20/2017   A/P:  update a1c today. Need copy of eye exam- ROI to be filled out   Future Appointments  Date Time Provider [place]  01/09/2018  9:00 AM [facility] NURSE [facility]  04/24/2018  9:30 AM Patel, Donika K, DO [facility] None   6  months. 4 months if a1c is above 7.   Lab/Order associations: Essential hypertension - Plan: Comprehensive metabolic panel  Z61 deficiency - Plan: Vitamin B12  Hyperlipidemia, unspecified hyperlipidemia type - Plan: Comprehensive metabolic panel  Type 2 diabetes mellitus without complication, without long-term current use of insulin ([place]) - Plan: Hemoglobin A1c  Return precautions advised.  Garret Reddish, MD

## 2017-12-27 ENCOUNTER — Encounter: Payer: Self-pay | Admitting: Family Medicine

## 2017-12-27 ENCOUNTER — Ambulatory Visit (INDEPENDENT_AMBULATORY_CARE_PROVIDER_SITE_OTHER): Payer: Medicare Other | Admitting: Family Medicine

## 2017-12-27 VITALS — BP 116/60 | HR 64 | Temp 97.4°F | Ht 67.0 in | Wt 180.6 lb

## 2017-12-27 DIAGNOSIS — H409 Unspecified glaucoma: Secondary | ICD-10-CM | POA: Insufficient documentation

## 2017-12-27 DIAGNOSIS — E538 Deficiency of other specified B group vitamins: Secondary | ICD-10-CM | POA: Diagnosis not present

## 2017-12-27 DIAGNOSIS — E785 Hyperlipidemia, unspecified: Secondary | ICD-10-CM

## 2017-12-27 DIAGNOSIS — E119 Type 2 diabetes mellitus without complications: Secondary | ICD-10-CM

## 2017-12-27 DIAGNOSIS — I10 Essential (primary) hypertension: Secondary | ICD-10-CM

## 2017-12-27 LAB — COMPREHENSIVE METABOLIC PANEL
ALK PHOS: 67 U/L (ref 39–117)
ALT: 13 U/L (ref 0–53)
AST: 17 U/L (ref 0–37)
Albumin: 4.4 g/dL (ref 3.5–5.2)
BUN: 14 mg/dL (ref 6–23)
CALCIUM: 9.1 mg/dL (ref 8.4–10.5)
CO2: 28 mEq/L (ref 19–32)
Chloride: 97 mEq/L (ref 96–112)
Creatinine, Ser: 0.83 mg/dL (ref 0.40–1.50)
GFR: 94.4 mL/min (ref >60.00)
Glucose, Bld: 113 mg/dL — ABNORMAL HIGH (ref 70–99)
POTASSIUM: 4 meq/L (ref 3.5–5.1)
Sodium: 133 mEq/L — ABNORMAL LOW (ref 135–145)
TOTAL PROTEIN: 7.4 g/dL (ref 6.0–8.3)
Total Bilirubin: 0.9 mg/dL (ref 0.2–1.2)

## 2017-12-27 LAB — VITAMIN B12: Vitamin B-12: 586 pg/mL (ref 211–911)

## 2017-12-27 LAB — HEMOGLOBIN A1C: Hgb A1c MFr Bld: 6.3 % (ref 4.6–6.5)

## 2017-12-27 NOTE — Patient Instructions (Addendum)
Sign release of information at the check out desk for last eye doctor report- we want to have yearly diabetic eye exams in our chart.   6 month check as long as a1c is under 7- otherwise 4 month check in.   Please stop by lab before you go

## 2017-12-27 NOTE — Assessment & Plan Note (Signed)
S: mild poorly controlled on no rx- at his age not clear benefits outweigh risks. No myalgias.  Lab Results  Component Value Date   CHOL 180 05/20/2017   HDL 40.80 05/20/2017   LDLCALC 118 (H) 05/20/2017   LDLDIRECT 127.3 02/11/2010   TRIG 102.0 05/20/2017   CHOLHDL 4 05/20/2017   A/P:  continue to monitor at least yearly. Last visit he was down 8 lbs. Has lost a few more lbs today- recheck lipids next time. Trying to help appetite by doing small amount of roasted nuts at night. Hopeful lipids even better next visit - has done better since diabetes education

## 2017-12-27 NOTE — Assessment & Plan Note (Signed)
S:  controlled. On last visit without medication after going to diabetes education. Walking at [employer] daily before (now at least most days) and staying low on candy, bread, potatoes and portion size in general- has slipped up some more Lab Results  Component Value Date   HGBA1C 6.2 08/24/2017   HGBA1C 7.2 (H) 05/20/2017   A/P:  update a1c today. Need copy of eye exam- ROI to be filled out

## 2017-12-27 NOTE — Assessment & Plan Note (Signed)
S: continued b12 injections monthly. Dr. Posey Pronto detected due to high MMA. Plan is to continue at least through april 2019 and then consider orals A/P: update b12, consider orals at next visit

## 2017-12-29 ENCOUNTER — Encounter: Payer: Self-pay | Admitting: Family Medicine

## 2018-01-09 ENCOUNTER — Ambulatory Visit (INDEPENDENT_AMBULATORY_CARE_PROVIDER_SITE_OTHER): Payer: Medicare Other

## 2018-01-09 DIAGNOSIS — E538 Deficiency of other specified B group vitamins: Secondary | ICD-10-CM

## 2018-01-09 MED ORDER — CYANOCOBALAMIN 1000 MCG/ML IJ SOLN
1000.0000 ug | Freq: Once | INTRAMUSCULAR | Status: AC
Start: 2018-01-09 — End: 2018-01-09
  Administered 2018-01-09: 1000 ug via INTRAMUSCULAR

## 2018-01-09 NOTE — Progress Notes (Signed)
B12 injection administered in the right deltoid. Patient tolerated well.

## 2018-02-06 ENCOUNTER — Ambulatory Visit: Payer: Medicare Other

## 2018-02-09 ENCOUNTER — Ambulatory Visit (INDEPENDENT_AMBULATORY_CARE_PROVIDER_SITE_OTHER): Payer: Medicare Other

## 2018-02-09 DIAGNOSIS — E538 Deficiency of other specified B group vitamins: Secondary | ICD-10-CM | POA: Diagnosis not present

## 2018-02-09 MED ORDER — CYANOCOBALAMIN 1000 MCG/ML IJ SOLN
1000.0000 ug | Freq: Once | INTRAMUSCULAR | Status: AC
  Administered 2018-02-09: 1000 ug via INTRAMUSCULAR

## 2018-02-09 NOTE — Progress Notes (Signed)
Patient in today for B12 injection due to B12 deficiency. Injection administered in the left deltoid and patient tolerated it well.

## 2018-02-13 ENCOUNTER — Encounter: Payer: Self-pay | Admitting: Gastroenterology

## 2018-02-15 NOTE — Progress Notes (Signed)
I have reviewed the patient's encounter and agree with the documentation.  Algis Greenhouse. Jerline Pain, MD 02/15/2018 11:59 AM

## 2018-02-20 DIAGNOSIS — L821 Other seborrheic keratosis: Secondary | ICD-10-CM | POA: Diagnosis not present

## 2018-02-20 DIAGNOSIS — L570 Actinic keratosis: Secondary | ICD-10-CM | POA: Diagnosis not present

## 2018-02-20 DIAGNOSIS — Z85828 Personal history of other malignant neoplasm of skin: Secondary | ICD-10-CM | POA: Diagnosis not present

## 2018-02-20 DIAGNOSIS — D485 Neoplasm of uncertain behavior of skin: Secondary | ICD-10-CM | POA: Diagnosis not present

## 2018-02-20 DIAGNOSIS — L718 Other rosacea: Secondary | ICD-10-CM | POA: Diagnosis not present

## 2018-02-20 DIAGNOSIS — L820 Inflamed seborrheic keratosis: Secondary | ICD-10-CM | POA: Diagnosis not present

## 2018-03-09 ENCOUNTER — Ambulatory Visit (INDEPENDENT_AMBULATORY_CARE_PROVIDER_SITE_OTHER): Payer: Medicare Other

## 2018-03-09 DIAGNOSIS — E538 Deficiency of other specified B group vitamins: Secondary | ICD-10-CM | POA: Diagnosis not present

## 2018-03-09 MED ORDER — CYANOCOBALAMIN 1000 MCG/ML IJ SOLN
1000.0000 ug | Freq: Once | INTRAMUSCULAR | Status: AC
  Administered 2018-03-09: 1000 ug via INTRAMUSCULAR

## 2018-03-09 NOTE — Progress Notes (Signed)
Per orders of Dr.Prker, injection of b-12given by Francella Solian. Patient tolerated injection well. Given in right delt.

## 2018-03-28 ENCOUNTER — Encounter: Payer: Self-pay | Admitting: Gastroenterology

## 2018-03-28 ENCOUNTER — Ambulatory Visit (INDEPENDENT_AMBULATORY_CARE_PROVIDER_SITE_OTHER): Payer: Medicare Other | Admitting: Gastroenterology

## 2018-03-28 VITALS — BP 126/58 | HR 68 | Ht 66.25 in | Wt 191.4 lb

## 2018-03-28 DIAGNOSIS — Z86010 Personal history of colonic polyps: Secondary | ICD-10-CM | POA: Diagnosis not present

## 2018-03-28 MED ORDER — NA SULFATE-K SULFATE-MG SULF 17.5-3.13-1.6 GM/177ML PO SOLN: 1.0000 | Freq: Once | ORAL | 0 refills | Status: AC

## 2018-03-28 NOTE — Progress Notes (Signed)
History of Present Illness: This is an 84 year old male self referred for the evaluation of a personal history of colon polyps. Pt is accompanied by his wife.  Last colonoscopy outlined below.  A 5-year interval surveillance colonoscopy was recommended however he did not return.  Patient states that his wife and Dr. Yong Channel have encouraged him to pursue his surveillance colonoscopy.  Patient is agreeable to do so.  He has no gastrointestinal complaints. Denies weight loss, abdominal pain, constipation, diarrhea, change in stool caliber, melena, hematochezia, nausea, vomiting, dysphagia, reflux symptoms, chest pain.  Colonoscopy 02/2010:     1) 3 - 5 mm, three polyps in the ascending colon     2) 4 mm sessile polyp in the mid transverse colon     3) 4 mm sessile polyp in the descending colon     4) 4 mm sessile polyp in the rectum     5) Internal hemorrhoids (5 adenomas and 1 hyperplastic)   Allergies  Allergen Reactions  . Celecoxib     REACTION: Upset GI  . Sulfonamide Derivatives     REACTION: rash   Outpatient Medications Prior to Visit  Medication Sig Dispense Refill  . amLODipine (NORVASC) 10 MG tablet Take 1 tablet (10 mg total) by mouth daily. 91 tablet 3  . benazepril-hydrochlorthiazide (LOTENSIN HCT) 20-25 MG tablet Take 1 tablet by mouth daily. 91 tablet 3  . Cyanocobalamin (VITAMIN B-12 IJ) Inject 1,000 mcg as directed every 30 (thirty) days.    . Omega-3 Fatty Acids (FISH OIL) 1000 MG CAPS Take 1 capsule by mouth daily.     No facility-administered medications prior to visit.    Past Medical History:  Diagnosis Date  . EXTERNAL HEMORRHOIDS, THROMBOSED 08/12/2010  . Hypertension   . Osteoarthritis   . Rosacea, acne   . ROTATOR CUFF TEAR 02/06/2008   Worked through with PT    . Tubular adenoma of colon 02/2010   Past Surgical History:  Procedure Laterality Date  . GLAUCOMA SURGERY    . JOINT REPLACEMENT     total knee replacement biltateral   Social History    Socioeconomic History  . Marital status: Married    Spouse name: Not on file  . Number of children: 2  . Years of education: Not on file  . Highest education level: Not on file  Occupational History  . Occupation: Retired  Scientific laboratory technician  . Financial resource strain: Not on file  . Food insecurity:    Worry: Not on file    Inability: Not on file  . Transportation needs:    Medical: Not on file    Non-medical: Not on file  Tobacco Use  . Smoking status: Former Smoker    Packs/day: 1.00    Years: 25.00    Pack years: 25.00    Types: Cigarettes    Last attempt to quit: 01/27/1974    Years since quitting: 44.1  . Smokeless tobacco: Never Used  . Tobacco comment: quit smoking 40 years ago  Substance and Sexual Activity  . Alcohol use: Yes    Alcohol/week: 4.2 oz    Types: 7 Standard drinks or equivalent per week    Comment: 2-3 beers nightly  . Drug use: No  . Sexual activity: Yes  Lifestyle  . Physical activity:    Days per week: Not on file    Minutes per session: Not on file  . Stress: Not on file  Relationships  . Social connections:  Talks on phone: Not on file    Gets together: Not on file    Attends religious service: Not on file    Active member of club or organization: Not on file    Attends meetings of clubs or organizations: Not on file    Relationship status: Not on file  Other Topics Concern  . Not on file  Social History Narrative   Designated Party Release signed on 04/29/10      Married (42 years in 2016-wife patient of Dr. Maudie Mercury), 2 children, 4 grandkids      Retired from [employer] of [employer] and [employer] [place] before then      Hobbies: exercise/biking, avid bass fisherman, yardwork      Highest level of education:  High school   Family History  Problem Relation Age of Onset  . Dementia Mother   . Heart attack Father        in his 53s, sedentary  . Osteoporosis Sister       Review of Systems: Pertinent  positive and negative review of systems were noted in the above HPI section. All other review of systems were otherwise negative.    Physical Exam: General: Well developed, well nourished, no acute distress Head: Normocephalic and atraumatic Eyes:  sclerae anicteric, EOMI Ears: Normal auditory acuity Mouth: No deformity or lesions Neck: Supple, no masses or thyromegaly Lungs: Clear throughout to auscultation Heart: Regular rate and rhythm; no murmurs, rubs or bruits Abdomen: Soft, non tender and non distended. No masses, hepatosplenomegaly or hernias noted. Normal Bowel sounds Rectal: Deferred to colonoscopy Musculoskeletal: Symmetrical with no gross deformities  Skin: No lesions on visible extremities Pulses:  Normal pulses noted Extremities: No clubbing, cyanosis, edema or deformities noted Neurological: Alert oriented x 4, grossly nonfocal Cervical Nodes:  No significant cervical adenopathy Inguinal Nodes: No significant inguinal adenopathy Psychological:  Alert and cooperative. Normal mood and affect  Assessment and Recommendations:  1.  Personal history of adenomatous colon polyps.  He is in good health and he wants to proceed with surveillance colonoscopy.  Schedule colonoscopy. The risks (including bleeding, perforation, infection, missed lesions, medication reactions and possible hospitalization or surgery if complications occur), benefits, and alternatives to colonoscopy with possible biopsy and possible polypectomy were discussed with the patient and they consent to proceed.

## 2018-03-28 NOTE — Patient Instructions (Signed)
You have been scheduled for a colonoscopy. Please follow written instructions given to you at your visit today.  Please pick up your prep supplies at the pharmacy within the next 1-3 days. If you use inhalers (even only as needed), please bring them with you on the day of your procedure. Your physician has requested that you go to www.startemmi.com and enter the access code given to you at your visit today. This web site gives a general overview about your procedure. However, you should still follow specific instructions given to you by our office regarding your preparation for the procedure.  Normal BMI (Body Mass Index- based on height and weight) is between 23 and 30. Your BMI today is Body mass index is 30.66 kg/m. Marland Kitchen Please consider follow up  regarding your BMI with your Primary Care Provider.  Thank you for choosing me and [facility].  Pricilla Riffle. Dagoberto Ligas., MD., Marval Regal

## 2018-04-06 ENCOUNTER — Ambulatory Visit (INDEPENDENT_AMBULATORY_CARE_PROVIDER_SITE_OTHER): Payer: Medicare Other

## 2018-04-06 DIAGNOSIS — E538 Deficiency of other specified B group vitamins: Secondary | ICD-10-CM

## 2018-04-06 MED ORDER — CYANOCOBALAMIN 1000 MCG/ML IJ SOLN
1000.0000 ug | Freq: Once | INTRAMUSCULAR | Status: AC
  Administered 2018-04-06: 1000 ug via INTRAMUSCULAR

## 2018-04-06 NOTE — Progress Notes (Signed)
Per orders of Dr.Hunter, injection of B-12 given by Francella Solian. Patient tolerated injection well. Given in left deltoid.

## 2018-04-24 ENCOUNTER — Other Ambulatory Visit (INDEPENDENT_AMBULATORY_CARE_PROVIDER_SITE_OTHER): Payer: Medicare Other

## 2018-04-24 ENCOUNTER — Encounter: Payer: Self-pay | Admitting: Neurology

## 2018-04-24 ENCOUNTER — Ambulatory Visit (INDEPENDENT_AMBULATORY_CARE_PROVIDER_SITE_OTHER): Payer: Medicare Other | Admitting: Neurology

## 2018-04-24 VITALS — BP 128/70 | HR 60 | Ht 66.25 in | Wt 188.1 lb

## 2018-04-24 DIAGNOSIS — G3184 Mild cognitive impairment, so stated: Secondary | ICD-10-CM

## 2018-04-24 DIAGNOSIS — E538 Deficiency of other specified B group vitamins: Secondary | ICD-10-CM

## 2018-04-24 LAB — VITAMIN B12: Vitamin B-12: 739 pg/mL (ref 211–911)

## 2018-04-24 NOTE — Progress Notes (Signed)
Follow-up Visit   Date: 04/24/18    Mitchell Walker MRN: [id number] DOB: Feb 07, 1934   Interim History: Mitchell Walker is a 84 y.o. right-handed Caucasian male with hypertension returning to the clinic for follow-up of mild cognitive impairment.  The patient was accompanied to the clinic by wife who also provides collateral information.    History of present illness: Starting around late 2017, his wife began noticing that when he drives, he forgets which roads to turn on and she thinks he may be distracted.  He forgets appointments and always repeats confirming the details. She often has to tell him the same information, or he quickly forgets; for instance, when driving, she will tell him to turn on a specific street, but he would forget this unless, she keeps reminding him or tells him with to turn.   He rarely misplaces objects.  He denies any problems with managing finances or medications.  He has some difficulty with word-finding, but not often.  He gets turned around when driving, and uses a GPS as needed. His wife stays that he drives too fast and needs to slow down, but she denies any safety concerns.  He denies difficulty recognizing faces, but has problems with recalling names.    He drinks 2-3 beers nightly for the past 50 years. He denies paresthesias of the feet.  He stays very active socially and started riding a bike lately with friends.   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   UPDATE 04/24/2018:  He is here for follow-up visit.  Overall, he is doing well with respect to his memory, but his wife continues to say that he gets confused with directions and navigating to places. He says that he forgets to take a turn every now and then.  He is able to manage day-to-day activities such as finances, medications, and decision-making. No changes in behavior.  Mood and sleep is good. He enjoys fishing and home improvement projects.  He has completed  vitamin B12 injections.    Medications:  Current Outpatient Medications on File Prior to Visit  Medication Sig Dispense Refill  . amLODipine (NORVASC) 10 MG tablet Take 1 tablet (10 mg total) by mouth daily. 91 tablet 3  . benazepril-hydrochlorthiazide (LOTENSIN HCT) 20-25 MG tablet Take 1 tablet by mouth daily. 91 tablet 3  . Omega-3 Fatty Acids (FISH OIL) 1000 MG CAPS Take 1 capsule by mouth daily.    . Cyanocobalamin (VITAMIN B-12 IJ) Inject 1,000 mcg as directed every 30 (thirty) days.     No current facility-administered medications on file prior to visit.     Allergies:  Allergies  Allergen Reactions  . Celecoxib     REACTION: Upset GI  . Sulfonamide Derivatives     REACTION: rash    Review of Systems:  CONSTITUTIONAL: No fevers, chills, night sweats, or weight loss.  EYES: No visual changes or eye pain ENT: No hearing changes.  No history of nose bleeds.   RESPIRATORY: No cough, wheezing and shortness of breath.   CARDIOVASCULAR: Negative for chest pain, and palpitations.   GI: Negative for abdominal discomfort, blood in stools or black stools.  No recent change in bowel habits.   GU:  No history of incontinence.   MUSCLOSKELETAL: No history of joint pain or swelling.  No myalgias.   SKIN: Negative for lesions, rash, and itching.   ENDOCRINE: Negative for cold or heat intolerance, polydipsia or goiter.   PSYCH:  No  depression or anxiety symptoms.   NEURO: As Above.   Vital Signs:  BP 128/70   Pulse 60   Ht 5' 6.25" (1.683 m)   Wt 188 lb 2 oz (85.3 kg)   SpO2 93%   BMI 30.14 kg/m    General Medical Exam:   General:  Well appearing, younger than stated age, comfortable  Neck:  No carotid bruits. Respiratory:  Clear to auscultation, good air entry bilaterally.   Cardiac:  Regular rate and rhythm, no murmur.   Ext; No edema  Neurological Exam: MENTAL STATUS including orientation to time, place, person, recent and remote memory, attention span and  concentration, language, and fund of knowledge is normal.  Speech is not dysarthric. Montreal Cognitive Assessment  04/24/2018 04/11/2017  Visuospatial/ Executive (0/5) 4 4  Naming (0/3) 3 3  Attention: Read list of digits (0/2) 2 2  Attention: Read list of letters (0/1) 1 1  Attention: Serial 7 subtraction starting at 100 (0/3) 3 3  Language: Repeat phrase (0/2) 2 2  Language : Fluency (0/1) 1 1  Abstraction (0/2) 2 1  Delayed Recall (0/5) 1 0  Orientation (0/6) 6 6  Total 25 23  Adjusted Score (based on education) 26 24    CRANIAL NERVES:  Pupils equal round and reactive to light.  Normal conjugate, extra-ocular eye movements in all directions of gaze.  No ptosis.Face is symmetric. Palate elevates symmetrically.  Tongue is midline.  MOTOR:  Motor strength is 5/5 in all extremities  SENSORY:  Intact to vibration throughout  COORDINATION/GAIT:   Gait narrow based and stable.    Data: Labs 10/22/2016:  Vitamin B12 422, RPR neg, TSH 2.34, HIV NR  Neuropsychological testing 06/23/2017:  Amnestic mild cognitive impairment  IMPRESSION/PLAN: 1.  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    - He scored well on his cognitive screening evaluation with 26/30 (-4 recall), which is improved from 2018.    - I offered to repeat neuropsychological testing, but since there has not been marked change, patient would like to reconsider this if there is any change in day-to-day functioning  - He is independent and high-functioning for his age.  I encouraged him to continue to stay active and engage in cognitively stimulating activities  - Driving safety discussed and encouraged for him to always use GPS   2.  Vitamin B12 deficiency  - He has completed monthly injections.  Recheck vitmain B12 level today to assess whether he can be transitioned to oral therapy  Return to clinic in 1 year   Thank you for allowing me to participate in patient's care.  If I can answer any additional questions, I  would be pleased to do so.    Sincerely,    Leeman Johnsey K. Posey Pronto, DO

## 2018-04-24 NOTE — Patient Instructions (Signed)
Check vitamin B12 levels.  If your levels are normal, start over the counter vitamin B12 1048mcg daily  Keep up the great work!  Continue to drive as long as you feel safe.  Return to clinic in 1 year

## 2018-04-25 ENCOUNTER — Telehealth: Payer: Self-pay | Admitting: *Deleted

## 2018-04-25 NOTE — Telephone Encounter (Signed)
-----   Message from Alda Berthold, DO sent at 04/24/2018  5:25 PM EDT ----- Please inform patient that his vitamin B12 level is better.  OK to start OTC B12 108mcg daily. Thanks.

## 2018-04-25 NOTE — Telephone Encounter (Signed)
Patient's wife given the instructions and results.

## 2018-06-08 ENCOUNTER — Encounter: Payer: Medicare Other | Admitting: Gastroenterology

## 2018-06-26 ENCOUNTER — Encounter: Payer: Self-pay | Admitting: Family Medicine

## 2018-06-26 ENCOUNTER — Ambulatory Visit: Payer: Medicare Other | Admitting: Family Medicine

## 2018-06-26 ENCOUNTER — Ambulatory Visit (INDEPENDENT_AMBULATORY_CARE_PROVIDER_SITE_OTHER): Payer: Medicare Other | Admitting: Family Medicine

## 2018-06-26 VITALS — BP 118/64 | HR 61 | Temp 97.7°F | Ht 66.25 in | Wt 185.2 lb

## 2018-06-26 DIAGNOSIS — E538 Deficiency of other specified B group vitamins: Secondary | ICD-10-CM

## 2018-06-26 DIAGNOSIS — E119 Type 2 diabetes mellitus without complications: Secondary | ICD-10-CM

## 2018-06-26 DIAGNOSIS — E785 Hyperlipidemia, unspecified: Secondary | ICD-10-CM | POA: Diagnosis not present

## 2018-06-26 DIAGNOSIS — G3184 Mild cognitive impairment, so stated: Secondary | ICD-10-CM

## 2018-06-26 DIAGNOSIS — D126 Benign neoplasm of colon, unspecified: Secondary | ICD-10-CM | POA: Diagnosis not present

## 2018-06-26 DIAGNOSIS — I10 Essential (primary) hypertension: Secondary | ICD-10-CM | POA: Diagnosis not present

## 2018-06-26 LAB — COMPREHENSIVE METABOLIC PANEL
ALBUMIN: 4.3 g/dL (ref 3.5–5.2)
ALT: 16 U/L (ref 0–53)
AST: 19 U/L (ref 0–37)
Alkaline Phosphatase: 71 U/L (ref 39–117)
BUN: 12 mg/dL (ref 6–23)
CALCIUM: 9.2 mg/dL (ref 8.4–10.5)
CHLORIDE: 95 meq/L — AB (ref 96–112)
CO2: 28 mEq/L (ref 19–32)
Creatinine, Ser: 0.98 mg/dL (ref 0.40–1.50)
GFR: 77.84 mL/min (ref >60.00)
Glucose, Bld: 106 mg/dL — ABNORMAL HIGH (ref 70–99)
POTASSIUM: 4.2 meq/L (ref 3.5–5.1)
Sodium: 132 mEq/L — ABNORMAL LOW (ref 135–145)
Total Bilirubin: 1.4 mg/dL — ABNORMAL HIGH (ref 0.2–1.2)
Total Protein: 7.1 g/dL (ref 6.0–8.3)

## 2018-06-26 LAB — CBC
HEMATOCRIT: 40.3 % (ref 39.0–52.0)
HEMOGLOBIN: 13.9 g/dL (ref 13.0–17.0)
MCHC: 34.6 g/dL (ref 30.0–36.0)
MCV: 85.6 fl (ref 78.0–100.0)
PLATELETS: 228 10*3/uL (ref 150.0–400.0)
RBC: 4.71 Mil/uL (ref 4.22–5.81)
RDW: 13.4 % (ref 11.5–15.5)
WBC: 6 10*3/uL (ref 4.0–10.5)

## 2018-06-26 LAB — POCT GLYCOSYLATED HEMOGLOBIN (HGB A1C): Hemoglobin A1C: 6 % — AB (ref 4.0–5.6)

## 2018-06-26 LAB — LIPID PANEL
CHOL/HDL RATIO: 4
Cholesterol: 203 mg/dL — ABNORMAL HIGH (ref 0–200)
HDL: 45.9 mg/dL (ref >39.00)
LDL CALC: 136 mg/dL — AB (ref 0–99)
NonHDL: 157.05
TRIGLYCERIDES: 107 mg/dL (ref 0.0–149.0)
VLDL: 21.4 mg/dL (ref 0.0–40.0)

## 2018-06-26 LAB — VITAMIN B12: Vitamin B-12: 785 pg/mL (ref 211–911)

## 2018-06-26 NOTE — Assessment & Plan Note (Signed)
S: poorly controlled on no medicine but not clear that primary prevention would benefit him at this age.  Lab Results  Component Value Date   CHOL 180 05/20/2017   HDL 40.80 05/20/2017   LDLCALC 118 (H) 05/20/2017   LDLDIRECT 127.3 02/11/2010   TRIG 102.0 05/20/2017   CHOLHDL 4 05/20/2017   A/P: update lipids today- he was kind enough to fast

## 2018-06-26 NOTE — Progress Notes (Signed)
Subjective:  Mitchell Walker is a 84 y.o. year old very pleasant male patient who presents for/with See problem oriented charting ROS- continued memory issues. No chest pain or shortness of breath or hypoglycemia reportd.    Past Medical History-  Patient Active Problem List   Diagnosis Date Noted  . Advanced directives, counseling/discussion 05/20/2017    Priority: High  . Diabetes mellitus ([place]) 05/20/2017    Priority: High  . B12 deficiency 05/02/2017    Priority: High  . MCI (mild cognitive impairment) 10/22/2016    Priority: High  . Glaucoma 12/27/2017    Priority: Medium  . Hyperlipidemia 02/03/2009    Priority: Medium  . Essential hypertension 07/17/2007    Priority: Medium  . Former smoker 05/21/2015    Priority: Low  . Adenomatous colon polyp 05/21/2015    Priority: Low  . Chronic frontal sinusitis 12/30/2010    Priority: Low  . ACNE ROSACEA 04/15/2010    Priority: Low  . Actinic keratosis 02/03/2009    Priority: Low  . Basal cell carcinoma of skin 02/29/2008    Priority: Low  . ERECTILE DYSFUNCTION 12/11/2007    Priority: Low  . Osteoarthritis 07/17/2007    Priority: Low    Medications- reviewed and updated Current Outpatient Medications  Medication Sig Dispense Refill  . amLODipine (NORVASC) 10 MG tablet Take 1 tablet (10 mg total) by mouth daily. 91 tablet 3  . benazepril-hydrochlorthiazide (LOTENSIN HCT) 20-25 MG tablet Take 1 tablet by mouth daily. 91 tablet 3  . Omega-3 Fatty Acids (FISH OIL) 1000 MG CAPS Take 1 capsule by mouth daily.    . Cyanocobalamin (VITAMIN B-12 IJ) Inject 1,000 mcg as directed every 30 (thirty) days.     No current facility-administered medications for this visit.     Objective: BP 118/64 (BP Location: Left Arm, Patient Position: Sitting, Cuff Size: Large)   Pulse 61   Temp 97.7 F (36.5 C) (Oral)   Ht 5' 6.25" (1.683 m)   Wt 185 lb 3.2 oz (84 kg)   SpO2 93%   BMI 29.67 kg/m  Gen: NAD, resting comfortably CV: RRR no  murmurs rubs or gallops Lungs: CTAB no crackles, wheeze, rhonchi Abdomen: soft/nontender/nondistended/normal bowel sounds  Ext: no edema Skin: warm, dry  Assessment/Plan:  Colonoscopy planned in august due to adenoma 2011 and patient needing follow up- hoping this will be last one.   Diabetes mellitus ([place]) S: diet controlled Lab Results  Component Value Date   HGBA1C 6.0 (A) 06/26/2018   HGBA1C 6.3 12/27/2017   HGBA1C 6.2 08/24/2017   A/P: continue without medication   Essential hypertension S: controlled on benazepril hct 20-25mg  and amlodipine 10mg  BP Readings from Last 3 Encounters:  06/26/18 118/64  04/24/18 128/70  03/28/18 (!) 126/58  A/P: We discussed blood pressure goal of <140/90. Continue current meds  Hyperlipidemia S: poorly controlled on no medicine but not clear that primary prevention would benefit him at this age.  Lab Results  Component Value Date   CHOL 180 05/20/2017   HDL 40.80 05/20/2017   LDLCALC 118 (H) 05/20/2017   LDLDIRECT 127.3 02/11/2010   TRIG 102.0 05/20/2017   CHOLHDL 4 05/20/2017   A/P: update lipids today- he was kind enough to fast  B12 deficiency S: b12 has done well on monthly injections- had last one in may. He was supposed to take his b12 pills- but had unopened bottles x2 Lab Results  Component Value Date   VITAMINB12 739 04/24/2018  A/P: we need  to update b12 and perhaps use injections to boost again if level is low. If level still normal will just transition to pills  MCI (mild cognitive impairment) Following with Dr. Posey Pronto- has follow up in 2020. Largely stable - still some issues with forgetfulness with driving but uses GPS very well  Future Appointments  Date Time Provider [place]  08/03/2018  3:30 PM Ladene Artist, MD [facility]  04/30/2019  9:30 AM Narda Amber K, DO [facility] None   Return in about 6 months (around 12/27/2018) for follow up- or sooner if needed.  Lab/Order associations: Type 2  diabetes mellitus without complication, without long-term current use of insulin ([place]) - Plan: POCT glycosylated hemoglobin (Hb A1C)  Essential hypertension  Hyperlipidemia, unspecified hyperlipidemia type - Plan: Lipid panel, CBC, Comprehensive metabolic panel, Comprehensive metabolic panel, CBC, Lipid panel  B12 deficiency - Plan: Vitamin B12, Vitamin B12  MCI (mild cognitive impairment)  Adenomatous polyp of colon, unspecified part of colon  Return precautions advised.  Garret Reddish, MD

## 2018-06-26 NOTE — Assessment & Plan Note (Signed)
Following with Dr. Posey Pronto- has follow up in 2020. Largely stable - still some issues with forgetfulness with driving but uses GPS very well

## 2018-06-26 NOTE — Assessment & Plan Note (Signed)
S: b12 has done well on monthly injections- had last one in may. He was supposed to take his b12 pills- but had unopened bottles x2 Lab Results  Component Value Date   [id number] 122 04/24/2018  A/P: we need to update b12 and perhaps use injections to boost again if level is low. If level still normal will just transition to pills

## 2018-06-26 NOTE — Patient Instructions (Addendum)
No changes today- need to take 1000 mcg or 1 mg of b12 every day  If #s are low- we may do short term injections to try to help  Please stop by lab before you go

## 2018-06-26 NOTE — Assessment & Plan Note (Signed)
S: diet controlled Lab Results  Component Value Date   HGBA1C 6.0 (A) 06/26/2018   HGBA1C 6.3 12/27/2017   HGBA1C 6.2 08/24/2017   A/P: continue without medication

## 2018-06-26 NOTE — Assessment & Plan Note (Signed)
S: controlled on benazepril hct 20-25mg  and amlodipine 10mg  BP Readings from Last 3 Encounters:  06/26/18 118/64  04/24/18 128/70  03/28/18 (!) 126/58  A/P: We discussed blood pressure goal of <140/90. Continue current meds

## 2018-06-29 ENCOUNTER — Other Ambulatory Visit: Payer: Self-pay | Admitting: Family Medicine

## 2018-06-30 ENCOUNTER — Other Ambulatory Visit: Payer: Self-pay | Admitting: Family Medicine

## 2018-07-05 ENCOUNTER — Telehealth: Payer: Self-pay | Admitting: Family Medicine

## 2018-07-05 NOTE — Telephone Encounter (Signed)
Copied from [place] [phone]. Topic: Quick Communication - Rx Refill/Question >> Jul 05, 2018  3:35 PM Neva Seat wrote: amLODipine (NORVASC) 10 MG tablet  Needing refills  [facility] [address] [place] - [address] [phone] (Phone) [phone] (Fax)

## 2018-07-07 NOTE — Telephone Encounter (Signed)
Norvasc refilled 06/29/18.

## 2018-08-03 ENCOUNTER — Encounter: Payer: Self-pay | Admitting: Gastroenterology

## 2018-08-03 ENCOUNTER — Ambulatory Visit (AMBULATORY_SURGERY_CENTER): Payer: Medicare Other | Admitting: Gastroenterology

## 2018-08-03 VITALS — BP 126/63 | HR 56 | Temp 98.9°F | Resp 19 | Ht 66.25 in | Wt 191.0 lb

## 2018-08-03 DIAGNOSIS — K6289 Other specified diseases of anus and rectum: Secondary | ICD-10-CM

## 2018-08-03 DIAGNOSIS — D128 Benign neoplasm of rectum: Secondary | ICD-10-CM

## 2018-08-03 DIAGNOSIS — Z86010 Personal history of colonic polyps: Secondary | ICD-10-CM

## 2018-08-03 DIAGNOSIS — I10 Essential (primary) hypertension: Secondary | ICD-10-CM | POA: Diagnosis not present

## 2018-08-03 DIAGNOSIS — D123 Benign neoplasm of transverse colon: Secondary | ICD-10-CM | POA: Diagnosis not present

## 2018-08-03 DIAGNOSIS — D125 Benign neoplasm of sigmoid colon: Secondary | ICD-10-CM | POA: Diagnosis not present

## 2018-08-03 DIAGNOSIS — D124 Benign neoplasm of descending colon: Secondary | ICD-10-CM

## 2018-08-03 MED ORDER — SODIUM CHLORIDE 0.9 % IV SOLN
500.0000 mL | Freq: Once | INTRAVENOUS | Status: DC
Start: 2018-08-03 — End: 2018-08-03

## 2018-08-03 NOTE — Patient Instructions (Signed)
**  Handout given on polyps**  **DO NOT take any anti inflammatory drugs, aspirin, Ibuprofen or advil for the next 2 weeks**   YOU HAD AN ENDOSCOPIC PROCEDURE TODAY: Refer to the procedure report and other information in the discharge instructions given to you for any specific questions about what was found during the examination. If this information does not answer your questions, please call [place] office at [phone] to clarify.   YOU SHOULD EXPECT: Some feelings of bloating in the abdomen. Passage of more gas than usual. Walking can help get rid of the air that was put into your GI tract during the procedure and reduce the bloating. If you had a lower endoscopy (such as a colonoscopy or flexible sigmoidoscopy) you may notice spotting of blood in your stool or on the toilet paper. Some abdominal soreness may be present for a day or two, also.  DIET: Your first meal following the procedure should be a light meal and then it is ok to progress to your normal diet. A half-sandwich or bowl of soup is an example of a good first meal. Heavy or fried foods are harder to digest and may make you feel nauseous or bloated. Drink plenty of fluids but you should avoid alcoholic beverages for 24 hours. If you had a esophageal dilation, please see attached instructions for diet.    ACTIVITY: Your care partner should take you home directly after the procedure. You should plan to take it easy, moving slowly for the rest of the day. You can resume normal activity the day after the procedure however YOU SHOULD NOT DRIVE, use power tools, machinery or perform tasks that involve climbing or major physical exertion for 24 hours (because of the sedation medicines used during the test).   SYMPTOMS TO REPORT IMMEDIATELY: A gastroenterologist can be reached at any hour. Please call [phone]  for any of the following symptoms:  Following lower endoscopy (colonoscopy, flexible sigmoidoscopy) Excessive amounts of blood  in the stool  Significant tenderness, worsening of abdominal pains  Swelling of the abdomen that is new, acute  Fever of 100 or higher   FOLLOW UP:  If any biopsies were taken you will be contacted by phone or by letter within the next 1-3 weeks. Call [phone]  if you have not heard about the biopsies in 3 weeks.  Please also call with any specific questions about appointments or follow up tests.

## 2018-08-03 NOTE — Op Note (Signed)
[place] Patient Name: Mitchell Walker Procedure Date: 08/03/2018 3:11 PM MRN: [id number] Endoscopist: Ladene Artist , MD Age: 84 Referring MD:  Date of Birth: Nov 24, 1934 Gender: Male Account #: [id number] Procedure:                Colonoscopy Indications:              Surveillance: Personal history of adenomatous                            polyps on last colonoscopy > 5 years ago Medicines:                Monitored Anesthesia Care Procedure:                Pre-Anesthesia Assessment:                           - Prior to the procedure, a History and Physical                            was performed, and patient medications and                            allergies were reviewed. The patient's tolerance of                            previous anesthesia was also reviewed. The risks                            and benefits of the procedure and the sedation                            options and risks were discussed with the patient.                            All questions were answered, and informed consent                            was obtained. Prior Anticoagulants: The patient has                            taken no previous anticoagulant or antiplatelet                            agents. ASA Grade Assessment: II - A patient with                            mild systemic disease. After reviewing the risks                            and benefits, the patient was deemed in                            satisfactory condition to undergo the procedure.  After obtaining informed consent, the colonoscope                            was passed under direct vision. Throughout the                            procedure, the patient's blood pressure, pulse, and                            oxygen saturations were monitored continuously. The                            Colonoscope was introduced through the anus and                            advanced to the the cecum,  identified by                            appendiceal orifice and ileocecal valve. The                            ileocecal valve, appendiceal orifice, and rectum                            were photographed. The quality of the bowel                            preparation was excellent. The colonoscopy was                            performed without difficulty. The patient tolerated                            the procedure well. Scope In: 3:17:49 PM Scope Out: 3:35:43 PM Scope Withdrawal Time: 0 hours 14 minutes 34 seconds  Total Procedure Duration: 0 hours 17 minutes 54 seconds  Findings:                 The perianal and digital rectal examinations were                            normal.                           Seven sessile polyps were found in the sigmoid                            colon, descending colon and transverse colon. The                            polyps were 6 to 8 mm in size. These polyps were                            removed with a cold snare. Resection and retrieval  were complete.                           A 6 mm polyp was found in the rectum. The polyp was                            sessile. The polyp was removed with a hot snare.                            Resection and retrieval were complete.                           The exam was otherwise without abnormality on                            direct and retroflexion views. Complications:            No immediate complications. Estimated blood loss:                            None. Estimated Blood Loss:     Estimated blood loss: none. Impression:               - One 6 mm polyp in the rectum, removed with a hot                            snare. Resected and retrieved.                           - Seven 6 to 8 mm polyps in the sigmoid colon, in                            the descending colon and in the transverse colon                            (5), removed with a cold snare. Resected and                             retrieved.                           - The examination was otherwise normal on direct                            and retroflexion views. Recommendation:           - Consider repeat colonoscopy after pending                            pathology results are reviewed for surveillance                            based on pathology results.                           - Patient has a contact  number available for                            emergencies. The signs and symptoms of potential                            delayed complications were discussed with the                            patient. Return to normal activities tomorrow.                            Written discharge instructions were provided to the                            patient.                           - Resume previous diet.                           - Continue present medications.                           - Await pathology results.                           - No aspirin, ibuprofen, naproxen, or other                            non-steroidal anti-inflammatory drugs for 2 weeks                            after polyp removal. Ladene Artist, MD 08/03/2018 3:41:09 PM This report has been signed electronically.

## 2018-08-03 NOTE — Progress Notes (Signed)
Called to room to assist during endoscopic procedure.  Patient ID and intended procedure confirmed with present staff. Received instructions for my participation in the procedure from the performing physician.  

## 2018-08-03 NOTE — Progress Notes (Signed)
History reviewed today 

## 2018-08-03 NOTE — Progress Notes (Signed)
Report to PACU, RN, vss, BBS= Clear.  

## 2018-08-04 ENCOUNTER — Telehealth: Payer: Self-pay | Admitting: *Deleted

## 2018-08-04 ENCOUNTER — Telehealth: Payer: Self-pay

## 2018-08-04 NOTE — Telephone Encounter (Signed)
Message left

## 2018-08-04 NOTE — Telephone Encounter (Signed)
Left message

## 2018-08-16 DIAGNOSIS — H25813 Combined forms of age-related cataract, bilateral: Secondary | ICD-10-CM | POA: Diagnosis not present

## 2018-08-16 DIAGNOSIS — H0100A Unspecified blepharitis right eye, upper and lower eyelids: Secondary | ICD-10-CM | POA: Diagnosis not present

## 2018-08-16 DIAGNOSIS — H5203 Hypermetropia, bilateral: Secondary | ICD-10-CM | POA: Diagnosis not present

## 2018-08-16 DIAGNOSIS — H40243 Residual stage of angle-closure glaucoma, bilateral: Secondary | ICD-10-CM | POA: Diagnosis not present

## 2018-08-22 ENCOUNTER — Encounter: Payer: Self-pay | Admitting: Gastroenterology

## 2018-09-11 DIAGNOSIS — H5203 Hypermetropia, bilateral: Secondary | ICD-10-CM | POA: Diagnosis not present

## 2018-09-11 DIAGNOSIS — H0100A Unspecified blepharitis right eye, upper and lower eyelids: Secondary | ICD-10-CM | POA: Diagnosis not present

## 2018-09-11 DIAGNOSIS — H25813 Combined forms of age-related cataract, bilateral: Secondary | ICD-10-CM | POA: Diagnosis not present

## 2018-10-09 ENCOUNTER — Ambulatory Visit (INDEPENDENT_AMBULATORY_CARE_PROVIDER_SITE_OTHER): Payer: Medicare Other

## 2018-10-09 DIAGNOSIS — Z23 Encounter for immunization: Secondary | ICD-10-CM | POA: Diagnosis not present

## 2018-10-31 DIAGNOSIS — H21562 Pupillary abnormality, left eye: Secondary | ICD-10-CM | POA: Diagnosis not present

## 2018-10-31 DIAGNOSIS — H268 Other specified cataract: Secondary | ICD-10-CM | POA: Diagnosis not present

## 2018-10-31 DIAGNOSIS — H25812 Combined forms of age-related cataract, left eye: Secondary | ICD-10-CM | POA: Diagnosis not present

## 2018-11-21 DIAGNOSIS — H25811 Combined forms of age-related cataract, right eye: Secondary | ICD-10-CM | POA: Diagnosis not present

## 2018-11-21 DIAGNOSIS — H21561 Pupillary abnormality, right eye: Secondary | ICD-10-CM | POA: Diagnosis not present

## 2018-12-27 ENCOUNTER — Encounter: Payer: Self-pay | Admitting: Family Medicine

## 2018-12-27 ENCOUNTER — Ambulatory Visit (INDEPENDENT_AMBULATORY_CARE_PROVIDER_SITE_OTHER): Payer: Medicare Other | Admitting: Family Medicine

## 2018-12-27 VITALS — BP 124/70 | HR 65 | Temp 97.5°F | Ht 66.25 in | Wt 188.2 lb

## 2018-12-27 DIAGNOSIS — E119 Type 2 diabetes mellitus without complications: Secondary | ICD-10-CM | POA: Diagnosis not present

## 2018-12-27 DIAGNOSIS — I10 Essential (primary) hypertension: Secondary | ICD-10-CM | POA: Diagnosis not present

## 2018-12-27 DIAGNOSIS — E785 Hyperlipidemia, unspecified: Secondary | ICD-10-CM | POA: Diagnosis not present

## 2018-12-27 DIAGNOSIS — E538 Deficiency of other specified B group vitamins: Secondary | ICD-10-CM | POA: Diagnosis not present

## 2018-12-27 LAB — COMPREHENSIVE METABOLIC PANEL
ALBUMIN: 4.1 g/dL (ref 3.5–5.2)
ALT: 16 U/L (ref 0–53)
AST: 18 U/L (ref 0–37)
Alkaline Phosphatase: 67 U/L (ref 39–117)
BUN: 9 mg/dL (ref 6–23)
CO2: 30 mEq/L (ref 19–32)
Calcium: 8.9 mg/dL (ref 8.4–10.5)
Chloride: 98 mEq/L (ref 96–112)
Creatinine, Ser: 0.83 mg/dL (ref 0.40–1.50)
GFR: 94.17 mL/min (ref >60.00)
Glucose, Bld: 118 mg/dL — ABNORMAL HIGH (ref 70–99)
Potassium: 3.8 mEq/L (ref 3.5–5.1)
Sodium: 134 mEq/L — ABNORMAL LOW (ref 135–145)
Total Bilirubin: 0.7 mg/dL (ref 0.2–1.2)
Total Protein: 7 g/dL (ref 6.0–8.3)

## 2018-12-27 LAB — POCT GLYCOSYLATED HEMOGLOBIN (HGB A1C): Hemoglobin A1C: 5.9 % — AB (ref 4.0–5.6)

## 2018-12-27 NOTE — Progress Notes (Signed)
Subjective:  Mitchell Walker is a 85 y.o. year old very pleasant male patient who presents for/with See problem oriented charting ROS- No chest pain or shortness of breath. No headache or blurry vision.  Memory issues slightly worsening over time.    Past Medical History-  Patient Active Problem List   Diagnosis Date Noted  . Advanced directives, counseling/discussion 05/20/2017    Priority: High  . Diabetes mellitus ([place]) 05/20/2017    Priority: High  . B12 deficiency 05/02/2017    Priority: High  . MCI (mild cognitive impairment) 10/22/2016    Priority: High  . Glaucoma 12/27/2017    Priority: Medium  . Hyperlipidemia 02/03/2009    Priority: Medium  . Essential hypertension 07/17/2007    Priority: Medium  . Former smoker 05/21/2015    Priority: Low  . Adenomatous colon polyp 05/21/2015    Priority: Low  . Chronic frontal sinusitis 12/30/2010    Priority: Low  . ACNE ROSACEA 04/15/2010    Priority: Low  . Actinic keratosis 02/03/2009    Priority: Low  . Basal cell carcinoma of skin 02/29/2008    Priority: Low  . ERECTILE DYSFUNCTION 12/11/2007    Priority: Low  . Osteoarthritis 07/17/2007    Priority: Low    Medications- reviewed and updated Current Outpatient Medications  Medication Sig Dispense Refill  . amLODipine (NORVASC) 10 MG tablet TAKE 1 TABLET BY MOUTH ONCE DAILY 90 tablet 3  . benazepril-hydrochlorthiazide (LOTENSIN HCT) 20-25 MG tablet TAKE 1 TABLET DAILY 90 tablet 3  . Omega-3 Fatty Acids (FISH OIL) 1000 MG CAPS Take 1 capsule by mouth daily.     No current facility-administered medications for this visit.     Objective: BP 124/70 (BP Location: Left Arm, Patient Position: Sitting, Cuff Size: Large)   Pulse 65   Temp (!) 97.5 F (36.4 C) (Oral)   Ht 5' 6.25" (1.683 m)   Wt 188 lb 3.2 oz (85.4 kg)   SpO2 94%   BMI 30.15 kg/m  Gen: NAD, resting comfortably CV: RRR no murmurs rubs or gallops Lungs: CTAB no crackles, wheeze, rhonchi Abdomen:  soft/nontender/nondistended/normal bowel sounds.  Ext: no edema Skin: warm, dry Neuro: grossly normal, moves all extremities Psych: Looks to wife for some answers  Diabetic Foot Exam - Simple   Simple Foot Form Diabetic Foot exam was performed with the following findings:  Yes 12/27/2018 10:21 AM  Visual Inspection No deformities, no ulcerations, no other skin breakdown bilaterally:  Yes Sensation Testing Intact to touch and monofilament testing bilaterally:  Yes Pulse Check Posterior Tibialis and Dorsalis pulse intact bilaterally:  Yes Comments    Assessment/Plan:  Diabetes mellitus ([place]) S: Remains diet controlled- despite the holidays and lots of chocolate covered cherries Lab Results  Component Value Date   HGBA1C 5.9 (A) 12/27/2018   HGBA1C 6.0 (A) 06/26/2018   HGBA1C 6.3 12/27/2017   A/P: Stable- continue without medication  Essential hypertension S: controlled on benazepril hydrochlorothiazide 20-25 mg and amlodipine 10 mg BP Readings from Last 3 Encounters:  12/27/18 124/70  08/03/18 126/63  06/26/18 118/64  A/P:  blood pressure goal of <140/90. Continue current meds  Hyperlipidemia S: Poorly controlled on no medication- primary prevention unclear at his age Lab Results  Component Value Date   CHOL 203 (H) 06/26/2018   HDL 45.90 06/26/2018   LDLCALC 136 (H) 06/26/2018   LDLDIRECT 127.3 02/11/2010   TRIG 107.0 06/26/2018   CHOLHDL 4 06/26/2018   A/P: Stable with mild poor control-continue without  medication unless indication changes to secondary prevention which we certainly hope it does not. Also has potential to cause memory issues though low risk     B12 deficiency S: Thankfully his vitamin B-12 remained up last visit even with missing 2 months of vitamin B12 supplements-since that time he has started the supplements  Lab Results  Component Value Date   VITAMINB12 785 06/26/2018  A/P: Likely stable-continue current medications-B12 1000 mcg  Future  Appointments  Date Time Provider Laura  04/30/2019  9:30 AM Narda Amber K, DO [facility] None   Return in about 6 months (around 06/27/2019) for follow up- or sooner if needed.  Lab/Order associations: Type 2 diabetes mellitus without complication, without long-term current use of insulin ([place]) - Plan: POCT glycosylated hemoglobin (Hb A1C), Comprehensive metabolic panel  Essential hypertension  Hyperlipidemia, unspecified hyperlipidemia type  B12 deficiency  Return precautions advised.  Garret Reddish, MD

## 2018-12-27 NOTE — Assessment & Plan Note (Signed)
S: controlled on benazepril hydrochlorothiazide 20-25 mg and amlodipine 10 mg BP Readings from Last 3 Encounters:  12/27/18 124/70  08/03/18 126/63  06/26/18 118/64  A/P:  blood pressure goal of <140/90. Continue current meds

## 2018-12-27 NOTE — Assessment & Plan Note (Signed)
S: Poorly controlled on no medication- primary prevention unclear at his age Lab Results  Component Value Date   CHOL 203 (H) 06/26/2018   HDL 45.90 06/26/2018   LDLCALC 136 (H) 06/26/2018   LDLDIRECT 127.3 02/11/2010   TRIG 107.0 06/26/2018   CHOLHDL 4 06/26/2018   A/P: Stable with mild poor control-continue without medication unless indication changes to secondary prevention which we certainly hope it does not. Also has potential to cause memory issues though low risk

## 2018-12-27 NOTE — Assessment & Plan Note (Signed)
S: Thankfully his vitamin B-12 remained up last visit even with missing 2 months of vitamin B12 supplements-since that time he has started the supplements  Lab Results  Component Value Date   VITAMINB12 785 06/26/2018  A/P: Likely stable-continue current medications-B12 1000 mcg

## 2018-12-27 NOTE — Assessment & Plan Note (Signed)
S: Remains diet controlled- despite the holidays and lots of chocolate covered cherries Lab Results  Component Value Date   HGBA1C 5.9 (A) 12/27/2018   HGBA1C 6.0 (A) 06/26/2018   HGBA1C 6.3 12/27/2017   A/P: Stable- continue without medication

## 2018-12-27 NOTE — Patient Instructions (Addendum)
Health Maintenance Due  Topic Date Due  . FOOT EXAM -completed today 08/24/2018  . OPHTHALMOLOGY EXAM -post op visit scheduled next week, they will schedule a diabetic eye exam 12/15/2018  . HEMOGLOBIN A1C -completed today 12/27/2018   Lab Results  Component Value Date   HGBA1C 5.9 (A) 12/27/2018   Please stop by lab before you go  No other changes today

## 2019-01-08 DIAGNOSIS — H0100B Unspecified blepharitis left eye, upper and lower eyelids: Secondary | ICD-10-CM | POA: Diagnosis not present

## 2019-01-08 DIAGNOSIS — H10503 Unspecified blepharoconjunctivitis, bilateral: Secondary | ICD-10-CM | POA: Diagnosis not present

## 2019-01-08 DIAGNOSIS — H02102 Unspecified ectropion of right lower eyelid: Secondary | ICD-10-CM | POA: Diagnosis not present

## 2019-01-08 DIAGNOSIS — H0100A Unspecified blepharitis right eye, upper and lower eyelids: Secondary | ICD-10-CM | POA: Diagnosis not present

## 2019-01-17 DIAGNOSIS — H04223 Epiphora due to insufficient drainage, bilateral lacrimal glands: Secondary | ICD-10-CM | POA: Diagnosis not present

## 2019-01-17 DIAGNOSIS — E119 Type 2 diabetes mellitus without complications: Secondary | ICD-10-CM | POA: Diagnosis not present

## 2019-01-17 DIAGNOSIS — H0100A Unspecified blepharitis right eye, upper and lower eyelids: Secondary | ICD-10-CM | POA: Diagnosis not present

## 2019-01-17 DIAGNOSIS — H0100B Unspecified blepharitis left eye, upper and lower eyelids: Secondary | ICD-10-CM | POA: Diagnosis not present

## 2019-01-17 LAB — HM DIABETES EYE EXAM

## 2019-02-01 ENCOUNTER — Encounter: Payer: Self-pay | Admitting: Family Medicine

## 2019-04-13 ENCOUNTER — Telehealth: Payer: Medicare Other | Admitting: Neurology

## 2019-04-13 ENCOUNTER — Other Ambulatory Visit: Payer: Self-pay

## 2019-04-18 ENCOUNTER — Other Ambulatory Visit: Payer: Self-pay

## 2019-04-18 MED ORDER — BENAZEPRIL-HYDROCHLOROTHIAZIDE 20-25 MG PO TABS: 1.0000 | ORAL_TABLET | Freq: Every day | ORAL | 0 refills | Status: DC

## 2019-04-18 NOTE — Telephone Encounter (Signed)
Rx refill  Lotensin HCT 20-25mg  Last fill 06/30/18  #90/3 Last ov 12/27/18 Next ov 04/26/19

## 2019-04-19 ENCOUNTER — Other Ambulatory Visit: Payer: Self-pay

## 2019-04-26 ENCOUNTER — Encounter: Payer: Self-pay | Admitting: Neurology

## 2019-04-26 ENCOUNTER — Telehealth (INDEPENDENT_AMBULATORY_CARE_PROVIDER_SITE_OTHER): Payer: Medicare Other | Admitting: Neurology

## 2019-04-26 ENCOUNTER — Other Ambulatory Visit: Payer: Self-pay

## 2019-04-26 VITALS — Ht 66.25 in | Wt 180.0 lb

## 2019-04-26 DIAGNOSIS — G3184 Mild cognitive impairment, so stated: Secondary | ICD-10-CM | POA: Diagnosis not present

## 2019-04-26 DIAGNOSIS — E538 Deficiency of other specified B group vitamins: Secondary | ICD-10-CM

## 2019-04-26 NOTE — Progress Notes (Signed)
Virtual Visit via Video Note The purpose of this virtual visit is to provide medical care while limiting exposure to the novel coronavirus.    Consent was obtained for video visit:  Yes.   Answered questions that patient had about telehealth interaction:  Yes.   I discussed the limitations, risks, security and privacy concerns of performing an evaluation and management service by telemedicine. I also discussed with the patient that there may be a patient responsible charge related to this service. The patient expressed understanding and agreed to proceed.  Pt location: Home Physician Location: office Name of referring provider:  [name], MD I connected with Mitchell Walker at patients initiation/request on 04/26/2019 at 10:30 AM EDT by video enabled telemedicine application and verified that I am speaking with the correct person using two identifiers. Pt MRN:  [id number] Pt DOB:  [birth year] Video Participants:  Mitchell Walker;  wife   History of Present Illness: This is a 85 y.o. male returning for follow-up of mild cognitive impairment.  Over the past year, he his wife has noticed ongoing problems with short-term memory and navigation.  He often forgets directions and regularly uses his GPS.  Wife does not have any safety concerns with his driving.  There have been no recent car accidents.  She is usually with him in the car and assist with directions as needed.  Wife says that she is the primary decision maker and initiates tasks, however when these tests are delegated to Mitchell Walker, he is able to perform them correctly.  No new changes with mood or memory which are well.  He continues to manage his own medications and household chores.  His wife has always managed his finances, however he does not have problems with dealing with cash at the stores.  No changes in behavior.   Observations/Objective:   Vitals:   04/26/19 0842  Weight: 180 lb (81.6 kg)  Height: 5' 6.25" (1.683 m)    [place] Cognitive Assessment  04/19/2019 04/24/2018 04/11/2017  Visuospatial/ Executive (0/5) 4 4 4   Naming (0/3) 3 3 3   Attention: Read list of digits (0/2) 2 2 2   Attention: Read list of letters (0/1) 1 1 1   Attention: Serial 7 subtraction starting at 100 (0/3) 3 3 3   Language: Repeat phrase (0/2) 2 2 2   Language : Fluency (0/1) 1 1 1   Abstraction (0/2) 2 2 1   Delayed Recall (0/5) 1 1 0  Orientation (0/6) 5 6 6   Total 24 25 23   Adjusted Score (based on education) 25 26 24     Patient is awake, alert, and appears comfortable.  Oriented x 4.   Extraocular muscles are intact. No ptosis.  Face is symmetric.  Speech is not dysarthric. Tongue is midline. Antigravity in all extremities.  No pronator drift. Gait appears normal.  Assessment and Plan:  1.  Mild cognitive impairment, stable.  -He scored 25/30 on MOCA, which has been relatively stable over the past few years.  -Clinically, he continues to be independent with IADLs and ADLs  -If there is a change in his cognition going forward, repeat neuropsychological testing would be indicated  -No role for medications at this time  -I educated patient on staying both physically and mentally active for brain health  -Wife will continue to oversee tasks such as driving and medications  -Strategies to help with task reminders discussed, such as keeping a list and calendar  2.  History of vitamin B12 deficiency  -Start  over-the-counter vitamin B12 1000 mcg daily   Follow Up Instructions:   I discussed the assessment and treatment plan with the patient. The patient was provided an opportunity to ask questions and all were answered. The patient agreed with the plan and demonstrated an understanding of the instructions.   The patient was advised to call back or seek an in-person evaluation if the symptoms worsen or if the condition fails to improve as anticipated.  Follow-up in 1 year  Time spent:  25 min  Alda Berthold, DO

## 2019-04-30 ENCOUNTER — Ambulatory Visit: Payer: Medicare Other | Admitting: Neurology

## 2019-05-04 ENCOUNTER — Ambulatory Visit: Payer: Medicare Other | Admitting: Neurology

## 2019-06-27 ENCOUNTER — Encounter: Payer: Self-pay | Admitting: Family Medicine

## 2019-06-27 ENCOUNTER — Ambulatory Visit (INDEPENDENT_AMBULATORY_CARE_PROVIDER_SITE_OTHER): Payer: Medicare Other | Admitting: Family Medicine

## 2019-06-27 ENCOUNTER — Other Ambulatory Visit: Payer: Self-pay

## 2019-06-27 VITALS — BP 128/70 | HR 61 | Temp 98.2°F | Ht 66.25 in | Wt 180.2 lb

## 2019-06-27 DIAGNOSIS — E663 Overweight: Secondary | ICD-10-CM

## 2019-06-27 DIAGNOSIS — E785 Hyperlipidemia, unspecified: Secondary | ICD-10-CM

## 2019-06-27 DIAGNOSIS — Z0000 Encounter for general adult medical examination without abnormal findings: Secondary | ICD-10-CM

## 2019-06-27 DIAGNOSIS — E1159 Type 2 diabetes mellitus with other circulatory complications: Secondary | ICD-10-CM | POA: Diagnosis not present

## 2019-06-27 DIAGNOSIS — E538 Deficiency of other specified B group vitamins: Secondary | ICD-10-CM | POA: Diagnosis not present

## 2019-06-27 DIAGNOSIS — E119 Type 2 diabetes mellitus without complications: Secondary | ICD-10-CM | POA: Diagnosis not present

## 2019-06-27 DIAGNOSIS — I10 Essential (primary) hypertension: Secondary | ICD-10-CM

## 2019-06-27 DIAGNOSIS — E1169 Type 2 diabetes mellitus with other specified complication: Secondary | ICD-10-CM | POA: Diagnosis not present

## 2019-06-27 DIAGNOSIS — I152 Hypertension secondary to endocrine disorders: Secondary | ICD-10-CM

## 2019-06-27 LAB — CBC
HCT: 39.7 % (ref 39.0–52.0)
Hemoglobin: 13.5 g/dL (ref 13.0–17.0)
MCHC: 34 g/dL (ref 30.0–36.0)
MCV: 86.9 fl (ref 78.0–100.0)
Platelets: 240 10*3/uL (ref 150.0–400.0)
RBC: 4.57 Mil/uL (ref 4.22–5.81)
RDW: 13.9 % (ref 11.5–15.5)
WBC: 5.6 10*3/uL (ref 4.0–10.5)

## 2019-06-27 LAB — COMPREHENSIVE METABOLIC PANEL
ALT: 12 U/L (ref 0–53)
AST: 16 U/L (ref 0–37)
Albumin: 4.3 g/dL (ref 3.5–5.2)
Alkaline Phosphatase: 69 U/L (ref 39–117)
BUN: 11 mg/dL (ref 6–23)
CO2: 27 mEq/L (ref 19–32)
Calcium: 8.7 mg/dL (ref 8.4–10.5)
Chloride: 97 mEq/L (ref 96–112)
Creatinine, Ser: 0.79 mg/dL (ref 0.40–1.50)
GFR: 93.68 mL/min (ref >60.00)
Glucose, Bld: 105 mg/dL — ABNORMAL HIGH (ref 70–99)
Potassium: 3.7 mEq/L (ref 3.5–5.1)
Sodium: 131 mEq/L — ABNORMAL LOW (ref 135–145)
Total Bilirubin: 0.9 mg/dL (ref 0.2–1.2)
Total Protein: 7 g/dL (ref 6.0–8.3)

## 2019-06-27 LAB — VITAMIN B12: Vitamin B-12: 783 pg/mL (ref 211–911)

## 2019-06-27 LAB — LIPID PANEL
Cholesterol: 179 mg/dL (ref 0–200)
HDL: 47.6 mg/dL (ref >39.00)
LDL Cholesterol: 117 mg/dL — ABNORMAL HIGH (ref 0–99)
NonHDL: 131.16
Total CHOL/HDL Ratio: 4
Triglycerides: 70 mg/dL (ref 0.0–149.0)
VLDL: 14 mg/dL (ref 0.0–40.0)

## 2019-06-27 LAB — HEMOGLOBIN A1C: Hgb A1c MFr Bld: 6 % (ref 4.6–6.5)

## 2019-06-27 MED ORDER — BENAZEPRIL-HYDROCHLOROTHIAZIDE 20-25 MG PO TABS: 1.0000 | ORAL_TABLET | Freq: Every day | ORAL | 3 refills | Status: DC

## 2019-06-27 MED ORDER — AMLODIPINE BESYLATE 10 MG PO TABS: 10.0000 mg | ORAL_TABLET | Freq: Every day | ORAL | 3 refills | Status: DC

## 2019-06-27 NOTE — Patient Instructions (Addendum)
Please stop by lab before you go If you do not have mychart- we will call you about results within 5 business days of [place] receiving them.  If you have mychart- we will send your results within 3 business days of [place] receiving them.  If abnormal or we want to clarify a result, we will call or mychart you to make sure you receive the message.  If you have questions or concerns or don't hear within 5-7 days, please send [place] a message or call us.     Mitchell Walker , Thank you for taking time to come for your Medicare Wellness Visit. I appreciate your ongoing commitment to your health goals. Please review the following plan we discussed and let me know if I can assist you in the future.   These are the goals we discussed: 1. Recommend 87-month follow-up ir sooner if you need [place] 2. Keep up the healthy eating! Eat a healthy diet rich with vegetables. Try to use fruit as "nature's candy"   This is a list of the screening recommended for you and due dates:  Health Maintenance  Topic Date Due  . Hemoglobin A1C  06/27/2019  . Flu Shot  07/14/2019  . Complete foot exam   12/28/2019  . Eye exam for diabetics  01/18/2020  . Colon Cancer Screening  08/03/2020  . Tetanus Vaccine  09/30/2027  . Pneumonia vaccines  Completed

## 2019-06-27 NOTE — Progress Notes (Signed)
Phone: [phone]    Subjective:   Patient presents today for their annual wellness visit.    Preventive Screening-Counseling & Management  Smoking Status: former Smoker but quit in 1975 Second Hand Smoking status: No smokers in home Alcohol intake: 14 per week, does not affect cognition - per him. Family has not noted difference   Risk Factors Regular exercise: cuts grass but on riding mower, doing some walking out in the yard- advised to target 150 minutes a week Staying well hydrated when out in heat Diet:  Wife has tightened up diet . Not getting out as much to grab sweets and treats Wt Readings from Last 3 Encounters:  06/27/19 180 lb 3.2 oz (81.7 kg)  04/26/19 180 lb (81.6 kg)  12/27/18 188 lb 3.2 oz (85.4 kg)  Fall Risk: None  Fall Risk  06/27/2019 06/26/2018 04/24/2018 08/12/2017 06/07/2017  Falls in the past year? 0 No No No No  Number falls in past yr: 0 - - - -  Injury with Fall? 0 - - - -  Opioid use history:  no long term opioids use  Cardiac risk factors:  advanced age (older than 67 for men, 48 for women)  known Hyperlipidemia - untreated for primary prevention controlled Hypertension traditionally DIabetes noted Family History: Heart attack in father in his 85s  Depression Screen None. PHQ2 0  Depression screen [facility] 2/9 06/27/2019 12/27/2018 06/26/2018 06/07/2017 05/20/2017  Decreased Interest 0 0 0 0 0  Down, Depressed, Hopeless 0 0 0 0 0  PHQ - 2 Score 0 0 0 0 0    Activities of Daily Living Independent ADLs and IADLs (he shops with a list from wife, [name] wife manages finances)  Hearing Difficulties: -patient declines issues as long as wearing hearing aid  Cognitive Testing             Patient with known mild cognitive impairment-follows closely with Dr. Ozella Rocks on medication at present  List the Names of Other Physician/Practitioners you currently use: -Dr. Posey Pronto of neurology -Dr. Felipa Evener ophthalmology - Dr. Fuller [facility]- now released   Immunization History  Administered Date(s) Administered  . Influenza Split 11/18/2011, 11/01/2012  . Influenza Whole 09/01/2010  . Influenza, High Dose Seasonal PF 09/18/2014, 10/28/2015, 10/12/2016, 10/17/2017, 10/09/2018  . Influenza,inj,Quad PF,6+ Mos 09/11/2013  . Pneumococcal Conjugate-13 05/21/2015  . Pneumococcal Polysaccharide-23 12/13/2004, 02/11/2010  . Td 12/14/2003  . Tdap 09/29/2017  . Tetanus 05/20/2014  . Zoster 03/04/2010   Required Immunizations needed today -discussed Shingrix but will defer this due to overlap of COVID-19 symptoms and side effects of Shingrix Health Maintenance  Topic Date Due  . Hemoglobin A1C  06/27/2019  . Flu Shot  07/14/2019  . Complete foot exam   12/28/2019  . Eye exam for diabetics  01/18/2020  . Colon Cancer Screening  08/03/2020  . Tetanus Vaccine  09/30/2027  . Pneumonia vaccines  Completed   Screening tests-updating A1c today for health maintenance 1. Colon cancer screening- last colonoscopy August 2019-was released from follow-up due to age 50. Lung Cancer screening- not a candidate as quit smoking over 15 years ago 3. Skin cancer screening- no dermatologist. No concerning skin lesions. Advised sunscreen use.  4. Prostate cancer screening-would not recommend routine screening as outside age based screening recommendations Lab Results  Component Value Date   PSA 4.52 (H) 05/20/2014   PSA 3.73 05/14/2013   PSA 4.04 (H) 05/10/2012   ROS- No pertinent positives discovered in course of AWV  The following were reviewed  and entered/updated in epic: Past Medical History:  Diagnosis Date  . EXTERNAL HEMORRHOIDS, THROMBOSED 08/12/2010  . Hypertension   . Osteoarthritis   . Rosacea, acne   . ROTATOR CUFF TEAR 02/06/2008   Worked through with PT    . Tubular adenoma of colon 02/2010   Patient Active Problem List   Diagnosis Date Noted  . Advanced directives, counseling/discussion 05/20/2017    Priority: High  . Diabetes mellitus  ([place]) 05/20/2017    Priority: High  . B12 deficiency 05/02/2017    Priority: High  . MCI (mild cognitive impairment) 10/22/2016    Priority: High  . Glaucoma 12/27/2017    Priority: Medium  . Hyperlipidemia associated with type 2 diabetes mellitus ([place]) 02/03/2009    Priority: Medium  . Hypertension associated with diabetes ([place]) 07/17/2007    Priority: Medium  . Former smoker 05/21/2015    Priority: Low  . Adenomatous colon polyp 05/21/2015    Priority: Low  . Chronic frontal sinusitis 12/30/2010    Priority: Low  . ACNE ROSACEA 04/15/2010    Priority: Low  . Actinic keratosis 02/03/2009    Priority: Low  . Basal cell carcinoma of skin 02/29/2008    Priority: Low  . ERECTILE DYSFUNCTION 12/11/2007    Priority: Low  . Osteoarthritis 07/17/2007    Priority: Low   Past Surgical History:  Procedure Laterality Date  . GLAUCOMA SURGERY    . JOINT REPLACEMENT     total knee replacement biltateral    Family History  Problem Relation Age of Onset  . Dementia Mother   . Heart attack Father        in his 10s, sedentary  . Osteoporosis Sister   . Colon cancer Maternal Grandfather   . Esophageal cancer Neg Hx   . Stomach cancer Neg Hx   . Rectal cancer Neg Hx     Medications- reviewed and updated Current Outpatient Medications  Medication Sig Dispense Refill  . amLODipine (NORVASC) 10 MG tablet TAKE 1 TABLET BY MOUTH ONCE DAILY 90 tablet 3  . benazepril-hydrochlorthiazide (LOTENSIN HCT) 20-25 MG tablet Take 1 tablet by mouth daily. 90 tablet 0  . Omega-3 Fatty Acids (FISH OIL) 1000 MG CAPS Take 1 capsule by mouth daily.    . vitamin B-12 (CYANOCOBALAMIN) 1000 MCG tablet Take 1,000 mcg by mouth daily.     No current facility-administered medications for this visit.     Allergies-reviewed and updated Allergies  Allergen Reactions  . Celecoxib     REACTION: Upset GI  . Sulfonamide Derivatives     REACTION: rash    Social History   Socioeconomic History  .  Marital status: Married    Spouse name: Not on file  . Number of children: 2  . Years of education: Not on file  . Highest education level: Not on file  Occupational History  . Occupation: Retired  Scientific laboratory technician  . Financial resource strain: Not on file  . Food insecurity    Worry: Not on file    Inability: Not on file  . Transportation needs    Medical: Not on file    Non-medical: Not on file  Tobacco Use  . Smoking status: Former Smoker    Packs/day: 1.00    Years: 25.00    Pack years: 25.00    Types: Cigarettes    Quit date: 01/27/1974    Years since quitting: 45.4  . Smokeless tobacco: Never Used  . Tobacco comment: quit smoking 40 years ago  Substance and Sexual Activity  . Alcohol use: Yes    Alcohol/week: 7.0 standard drinks    Types: 7 Standard drinks or equivalent per week    Comment: 2-3 beers nightly  . Drug use: No  . Sexual activity: Yes  Lifestyle  . Physical activity    Days per week: Not on file    Minutes per session: Not on file  . Stress: Not on file  Relationships  . Social Herbalist on phone: Not on file    Gets together: Not on file    Attends religious service: Not on file    Active member of club or organization: Not on file    Attends meetings of clubs or organizations: Not on file    Relationship status: Not on file  Other Topics Concern  . Not on file  Social History Narrative   Designated Party Release signed on 04/29/10      Married (58 years in 2016-wife patient of Dr. Maudie Mercury), 2 children, 4 grandkids      Retired from department of [employer] and [employer] [place] before then      Hobbies: exercise/biking, avid bass fisherman, yardwork      Highest level of education:  High school      Objective:  BP 128/70 (BP Location: Left Arm, Patient Position: Sitting, Cuff Size: Normal)   Pulse 61   Temp 98.2 F (36.8 C) (Oral)   Ht 5' 6.25" (1.683 m)   Wt 180 lb 3.2 oz (81.7 kg)   SpO2 94%   BMI  28.87 kg/m  Gen: NAD, resting comfortably HEENT: Mucous membranes are moist. Oropharynx normal Neck: no thyromegaly CV: RRR no murmurs rubs or gallops Lungs: CTAB no crackles, wheeze, rhonchi Abdomen: soft/nontender/nondistended/normal bowel sounds. No rebound or guarding.  Ext: no edema Skin: warm, dry Neuro: grossly normal, moves all extremities, PERRLA   Assessment/Plan:  AWV completed- discussed recommended screenings and documented any personalized health advice and referrals for preventive counseling. See AVS as well which was given to patient.   Status of chronic or acute concerns  See problem oriented note  Recommended follow up: 1 year Medicare wellness exam advised-with me or if we have a nurse available at that time Future Appointments  Date Time Provider [place]  04/30/2020 10:30 AM Narda Amber K, DO [facility] None    Lab/Order associations: None-labs are problem-oriented   ICD-10-CM   1. Preventative health care      Return precautions advised. Garret Reddish, MD

## 2019-06-27 NOTE — Progress Notes (Signed)
Phone [phone]   Subjective:  Mitchell Walker is a 85 y.o. year old very pleasant male patient who presents for/with See problem oriented charting Chief Complaint  Patient presents with  . Follow-up    Fasting today.   . Diabetes   ROS-  No chest pain or shortness of breath. No headache or blurry vision.    Past Medical History-  Patient Active Problem List   Diagnosis Date Noted  . Advanced directives, counseling/discussion 05/20/2017    Priority: High  . Diabetes mellitus ([place]) 05/20/2017    Priority: High  . B12 deficiency 05/02/2017    Priority: High  . MCI (mild cognitive impairment) 10/22/2016    Priority: High  . Glaucoma 12/27/2017    Priority: Medium  . Hyperlipidemia associated with type 2 diabetes mellitus ([place]) 02/03/2009    Priority: Medium  . Hypertension associated with diabetes ([place]) 07/17/2007    Priority: Medium  . Former smoker 05/21/2015    Priority: Low  . Adenomatous colon polyp 05/21/2015    Priority: Low  . Chronic frontal sinusitis 12/30/2010    Priority: Low  . ACNE ROSACEA 04/15/2010    Priority: Low  . Actinic keratosis 02/03/2009    Priority: Low  . Basal cell carcinoma of skin 02/29/2008    Priority: Low  . ERECTILE DYSFUNCTION 12/11/2007    Priority: Low  . Osteoarthritis 07/17/2007    Priority: Low    Medications- reviewed and updated Current Outpatient Medications  Medication Sig Dispense Refill  . amLODipine (NORVASC) 10 MG tablet TAKE 1 TABLET BY MOUTH ONCE DAILY 90 tablet 3  . benazepril-hydrochlorthiazide (LOTENSIN HCT) 20-25 MG tablet Take 1 tablet by mouth daily. 90 tablet 0  . Omega-3 Fatty Acids (FISH OIL) 1000 MG CAPS Take 1 capsule by mouth daily.    . vitamin B-12 (CYANOCOBALAMIN) 1000 MCG tablet Take 1,000 mcg by mouth daily.     No current facility-administered medications for this visit.      Objective:  BP 128/70 (BP Location: Left Arm, Patient Position: Sitting, Cuff Size: Normal)   Pulse 61   Temp 98.2  F (36.8 C) (Oral)   Ht 5' 6.25" (1.683 m)   Wt 180 lb 3.2 oz (81.7 kg)   SpO2 94%   BMI 28.87 kg/m  Gen: NAD, resting comfortably CV: RRR no murmurs rubs or gallops Lungs: CTAB no crackles, wheeze, rhonchi Abdomen: soft/nontender/nondistended/normal bowel sounds. No rebound or guarding.  Ext: no edema Skin: warm, dry Neuro: grossly normal, moves all extremities, daughter in law helps him answer some questions    Assessment and Plan   % Mild cognitive impairment-follows with Dr. Posey Pronto- saw in may  #Glaucoma- follows with ophthalmology regularly  #hypertension S: compliant with Amlodipine 10 mg and Benazepril-HCT 20-25 mg daily.  A/P:  Stable. Continue current medications.    # Diabetes/overweight S: diet controlled. No Rx.  CBGs- does not check cbgs Exercise and diet- has improved diet recently- advised regular exercise Lab Results  Component Value Date   HGBA1C 5.9 (A) 12/27/2018   HGBA1C 6.0 (A) 06/26/2018   HGBA1C 6.3 12/27/2017   A/P:  hopefully stable- update lipids today Overweight- Encouraged need for healthy eating, regular exercise, weight loss.   #hyperlipidemia S: patient takes Omega-3. Benefit of primary prevention at his age and with memory loss is unclear- will hold off on statin Lab Results  Component Value Date   CHOL 203 (H) 06/26/2018   HDL 45.90 06/26/2018   LDLCALC 136 (H) 06/26/2018  LDLDIRECT 127.3 02/11/2010   TRIG 107.0 06/26/2018   CHOLHDL 4 06/26/2018   A/P:  hopefully at least stable- update lipids today. Wonder if will have some improvement with improved diet  # B12 Deficiency S:B12 last checked 06/26/18 and was 785 pg/mL.  He remains on daily supplements 1000 mcg A/P: We will update B12 level today-hopefully stable  Recommended follow up: 6 months Future Appointments  Date Time Provider [place]  04/30/2020 10:30 AM Narda Amber K, DO [facility] None   Lab/Order associations:   ICD-10-CM   1. Type 2 diabetes mellitus  without complication, without long-term current use of insulin (HCC)  E11.9 CBC    Comprehensive metabolic panel    Lipid panel    Hemoglobin A1c  2. Hyperlipidemia associated with type 2 diabetes mellitus ([place])  E11.69    E78.5   3. Hypertension associated with diabetes ([place])  E11.59    I10   5. B12 deficiency  E53.8 Vitamin B12  6. Overweight  E66.3    Return precautions advised.  Garret Reddish, MD

## 2019-09-21 ENCOUNTER — Encounter: Payer: Self-pay | Admitting: Neurology

## 2019-09-25 ENCOUNTER — Encounter: Payer: Self-pay | Admitting: Family Medicine

## 2019-09-25 ENCOUNTER — Other Ambulatory Visit: Payer: Self-pay

## 2019-09-25 ENCOUNTER — Ambulatory Visit (INDEPENDENT_AMBULATORY_CARE_PROVIDER_SITE_OTHER): Payer: Medicare Other

## 2019-09-25 DIAGNOSIS — Z23 Encounter for immunization: Secondary | ICD-10-CM | POA: Diagnosis not present

## 2019-10-10 DIAGNOSIS — H0100A Unspecified blepharitis right eye, upper and lower eyelids: Secondary | ICD-10-CM | POA: Diagnosis not present

## 2019-10-10 DIAGNOSIS — H1031 Unspecified acute conjunctivitis, right eye: Secondary | ICD-10-CM | POA: Diagnosis not present

## 2019-10-12 DIAGNOSIS — D0461 Carcinoma in situ of skin of right upper limb, including shoulder: Secondary | ICD-10-CM | POA: Diagnosis not present

## 2019-10-12 DIAGNOSIS — D224 Melanocytic nevi of scalp and neck: Secondary | ICD-10-CM | POA: Diagnosis not present

## 2019-10-12 DIAGNOSIS — L821 Other seborrheic keratosis: Secondary | ICD-10-CM | POA: Diagnosis not present

## 2019-10-12 DIAGNOSIS — D225 Melanocytic nevi of trunk: Secondary | ICD-10-CM | POA: Diagnosis not present

## 2019-10-12 DIAGNOSIS — Z85828 Personal history of other malignant neoplasm of skin: Secondary | ICD-10-CM | POA: Diagnosis not present

## 2019-10-12 DIAGNOSIS — L570 Actinic keratosis: Secondary | ICD-10-CM | POA: Diagnosis not present

## 2019-10-12 DIAGNOSIS — L718 Other rosacea: Secondary | ICD-10-CM | POA: Diagnosis not present

## 2019-10-12 DIAGNOSIS — D1801 Hemangioma of skin and subcutaneous tissue: Secondary | ICD-10-CM | POA: Diagnosis not present

## 2019-10-12 DIAGNOSIS — L814 Other melanin hyperpigmentation: Secondary | ICD-10-CM | POA: Diagnosis not present

## 2019-10-19 DIAGNOSIS — D0461 Carcinoma in situ of skin of right upper limb, including shoulder: Secondary | ICD-10-CM | POA: Diagnosis not present

## 2019-10-19 DIAGNOSIS — Z85828 Personal history of other malignant neoplasm of skin: Secondary | ICD-10-CM | POA: Diagnosis not present

## 2019-10-24 DIAGNOSIS — H1031 Unspecified acute conjunctivitis, right eye: Secondary | ICD-10-CM | POA: Diagnosis not present

## 2019-10-24 DIAGNOSIS — H0100A Unspecified blepharitis right eye, upper and lower eyelids: Secondary | ICD-10-CM | POA: Diagnosis not present

## 2019-11-07 ENCOUNTER — Other Ambulatory Visit: Payer: Self-pay

## 2019-12-22 ENCOUNTER — Ambulatory Visit: Payer: Medicare Other | Attending: Internal Medicine

## 2019-12-22 DIAGNOSIS — Z23 Encounter for immunization: Secondary | ICD-10-CM | POA: Diagnosis not present

## 2019-12-22 NOTE — Progress Notes (Signed)
.    Covid-19 Vaccination Clinic  Name:  Mitchell Walker    MRN: [id number] DOB: 1934-10-23  12/22/2019  [name] was observed post Covid-19 immunization for 15 minutes without incidence. He was provided with Vaccine Information Sheet and instruction to access the V-Safe system.   [name] was instructed to call 911 with any severe reactions post vaccine: Marland Kitchen Difficulty breathing  . Swelling of your face and throat  . A fast heartbeat  . A bad rash all over your body  . Dizziness and weakness    Immunizations Administered    Name Date Dose VIS Date Route   Pfizer COVID-19 Vaccine 12/22/2019  2:49 PM 0.3 mL 11/23/2019 Intramuscular   Manufacturer: [place]   Lot: [id number]   [place]: [id number]

## 2020-01-02 ENCOUNTER — Ambulatory Visit: Payer: Medicare Other | Admitting: Family Medicine

## 2020-01-12 ENCOUNTER — Ambulatory Visit: Payer: Medicare Other | Attending: Internal Medicine

## 2020-01-12 ENCOUNTER — Ambulatory Visit: Payer: Medicare Other

## 2020-01-12 DIAGNOSIS — Z23 Encounter for immunization: Secondary | ICD-10-CM | POA: Insufficient documentation

## 2020-01-12 NOTE — Progress Notes (Signed)
   Covid-19 Vaccination Clinic  Name:  Mitchell Walker    MRN: [id number] DOB: 1934/02/27  01/12/2020  Mr. Mitchell Walker was observed post Covid-19 immunization for 15 minutes without incidence. He was provided with Vaccine Information Sheet and instruction to access the V-Safe system.   Mr. Mitchell Walker was instructed to call 911 with any severe reactions post vaccine: Marland Kitchen Difficulty breathing  . Swelling of your face and throat  . A fast heartbeat  . A bad rash all over your body  . Dizziness and weakness    Immunizations Administered    Name Date Dose VIS Date Route   Pfizer COVID-19 Vaccine 01/12/2020 11:13 AM 0.3 mL 11/23/2019 Intramuscular   Manufacturer: [place]   Lot: [id number]   [place]: [id number]

## 2020-01-21 DIAGNOSIS — E119 Type 2 diabetes mellitus without complications: Secondary | ICD-10-CM | POA: Diagnosis not present

## 2020-01-21 DIAGNOSIS — H52203 Unspecified astigmatism, bilateral: Secondary | ICD-10-CM | POA: Diagnosis not present

## 2020-01-21 DIAGNOSIS — H26493 Other secondary cataract, bilateral: Secondary | ICD-10-CM | POA: Diagnosis not present

## 2020-01-21 DIAGNOSIS — H0100A Unspecified blepharitis right eye, upper and lower eyelids: Secondary | ICD-10-CM | POA: Diagnosis not present

## 2020-01-21 LAB — HM DIABETES EYE EXAM

## 2020-01-25 ENCOUNTER — Other Ambulatory Visit: Payer: Self-pay

## 2020-01-28 ENCOUNTER — Ambulatory Visit (INDEPENDENT_AMBULATORY_CARE_PROVIDER_SITE_OTHER): Payer: Medicare Other | Admitting: Family Medicine

## 2020-01-28 ENCOUNTER — Encounter: Payer: Self-pay | Admitting: Family Medicine

## 2020-01-28 ENCOUNTER — Other Ambulatory Visit: Payer: Self-pay

## 2020-01-28 VITALS — BP 130/68 | HR 68 | Temp 98.0°F | Ht 66.25 in | Wt 181.0 lb

## 2020-01-28 DIAGNOSIS — E785 Hyperlipidemia, unspecified: Secondary | ICD-10-CM | POA: Diagnosis not present

## 2020-01-28 DIAGNOSIS — E119 Type 2 diabetes mellitus without complications: Secondary | ICD-10-CM

## 2020-01-28 DIAGNOSIS — E1169 Type 2 diabetes mellitus with other specified complication: Secondary | ICD-10-CM

## 2020-01-28 DIAGNOSIS — I10 Essential (primary) hypertension: Secondary | ICD-10-CM

## 2020-01-28 DIAGNOSIS — E1159 Type 2 diabetes mellitus with other circulatory complications: Secondary | ICD-10-CM | POA: Diagnosis not present

## 2020-01-28 DIAGNOSIS — E538 Deficiency of other specified B group vitamins: Secondary | ICD-10-CM

## 2020-01-28 DIAGNOSIS — I152 Hypertension secondary to endocrine disorders: Secondary | ICD-10-CM

## 2020-01-28 LAB — POCT GLYCOSYLATED HEMOGLOBIN (HGB A1C): Hemoglobin A1C: 5.9 % — AB (ref 4.0–5.6)

## 2020-01-28 NOTE — Patient Instructions (Addendum)
Lab Results  Component Value Date   HGBA1C 5.9 (A) 01/28/2020    Do want you to start some indoor exercise. I suspect if we had drawn blood instead of fingerprick a1c would be up closer to 6.4- we want to prevent this from getting higher. May consider every other day on the ice cream as well  Recommended follow up: Return in about 6 months (around 07/27/2020) for follow up- or sooner if needed.

## 2020-01-28 NOTE — Progress Notes (Signed)
Phone [phone] In person visit   Subjective:   Mitchell Walker is a 86 y.o. year old very pleasant male patient who presents for/with See problem oriented charting Chief Complaint  Patient presents with  . Hypertension  . Diabetes  . Hyperlipidemia   This visit occurred during the SARS-CoV-2 public health emergency.  Safety protocols were in place, including screening questions prior to the visit, additional usage of staff PPE, and extensive cleaning of exam room while observing appropriate contact time as indicated for disinfecting solutions.   Past Medical History-  Patient Active Problem List   Diagnosis Date Noted  . Advanced directives, counseling/discussion 05/20/2017    Priority: High  . Diabetes mellitus ([place]) 05/20/2017    Priority: High  . B12 deficiency 05/02/2017    Priority: High  . MCI (mild cognitive impairment) 10/22/2016    Priority: High  . Glaucoma 12/27/2017    Priority: Medium  . Hyperlipidemia associated with type 2 diabetes mellitus ([place]) 02/03/2009    Priority: Medium  . Hypertension associated with diabetes ([place]) 07/17/2007    Priority: Medium  . Former smoker 05/21/2015    Priority: Low  . Adenomatous colon polyp 05/21/2015    Priority: Low  . Chronic frontal sinusitis 12/30/2010    Priority: Low  . ACNE ROSACEA 04/15/2010    Priority: Low  . Actinic keratosis 02/03/2009    Priority: Low  . Basal cell carcinoma of skin 02/29/2008    Priority: Low  . ERECTILE DYSFUNCTION 12/11/2007    Priority: Low  . Osteoarthritis 07/17/2007    Priority: Low    Medications- reviewed and updated Current Outpatient Medications  Medication Sig Dispense Refill  . amLODipine (NORVASC) 10 MG tablet Take 1 tablet (10 mg total) by mouth daily. 90 tablet 3  . benazepril-hydrochlorthiazide (LOTENSIN HCT) 20-25 MG tablet Take 1 tablet by mouth daily. 90 tablet 3  . Omega-3 Fatty Acids (FISH OIL) 1000 MG CAPS Take 1 capsule by mouth daily.    . vitamin B-12  (CYANOCOBALAMIN) 1000 MCG tablet Take 1,000 mcg by mouth daily.     No current facility-administered medications for this visit.     Objective:  BP 130/68   Pulse 68   Temp 98 F (36.7 C) (Temporal)   Ht 5' 6.25" (1.683 m)   Wt 181 lb (82.1 kg)   SpO2 95%   BMI 28.99 kg/m  Gen: NAD, resting comfortably CV: RRR no murmurs rubs or gallops Lungs: CTAB no crackles, wheeze, rhonchi  Ext: no edema Skin: warm, dry    Assessment and Plan   % Mild cognitive impairment-follows with Dr. Posey Pronto -has follow-up scheduled in May  #Glaucoma- no issues since laser treatment for narrow angle glaucoma  #hypertension S: compliant with Amlodipine 10 mg and Benazepril-HCT 20-25 mg daily. Patient denies any chest pain, shortness of breath, changes or changes in vision. Does not add salt to food. Tries to maintain a heart healthy diet.   BP Readings from Last 3 Encounters:  01/28/20 130/68  06/27/19 128/70  12/27/18 124/70   A/P: Good control today-continue current medication  # Diabetes S: diet controlled. No. Rx.   Lab Results  Component Value Date   HGBA1C   POC 6.0 06/27/2019   HGBA1C 5.9 (A) 12/27/2018   HGBA1C 6.0 (A) 06/26/2018  CBGs- Does not check blood sugars at home.  Exercise and diet- Eats heart healthy diet. Has not been exercising due to weather.   A/P: Remains well controlled on point-of-care A1c today-recommended he  try to exercise even if indoors.  #hyperlipidemia S: patient takes Omega-3. Benefit of primary prevention at his age and with memory loss is unclear- will hold off on statin  Lab Results  Component Value Date   CHOL 179 06/27/2019   HDL 47.60 06/27/2019   LDLCALC 117 (H) 06/27/2019   LDLDIRECT 127.3 02/11/2010   TRIG 70.0 06/27/2019   CHOLHDL 4 06/27/2019   A/P: Mild poor control of lipids but as above would not recommend starting statin for primary prevention  # B12 Deficiency S:B12 last checked July 2020 and within normal range he remains on daily  supplements 1000 mcg  Lab Results  Component Value Date   [id number] 06/27/2019   A/P:  Doing well-continue current supplements  #Hyponatremia-has had slightly low sodium-discussed checking today but htye would prefer to wait until next visit  #Vaccination status-fully immunized against COVID-19!  recommended follow up: 25-month follow-up or sooner if needed Future Appointments  Date Time Provider [place]  05/01/2020 10:30 AM Narda Amber K, DO [facility] None   Lab/Order associations:   ICD-10-CM   1. Hypertension associated with diabetes ([place])  E11.59    I10   2. Type 2 diabetes mellitus without complication, without long-term current use of insulin (HCC)  E11.9 POCT glycosylated hemoglobin (Hb A1C)  3. Hyperlipidemia associated with type 2 diabetes mellitus ([place])  E11.69    E78.5   4. B12 deficiency  E53.8    Return precautions advised.  Garret Reddish, MD

## 2020-01-31 ENCOUNTER — Encounter: Payer: Self-pay | Admitting: Family Medicine

## 2020-01-31 NOTE — Telephone Encounter (Deleted)
Mitchell Walker" Male, 86 y.o., 02-04-1934

## 2020-02-25 ENCOUNTER — Other Ambulatory Visit: Payer: Self-pay

## 2020-02-25 ENCOUNTER — Encounter: Payer: Self-pay | Admitting: Neurology

## 2020-02-25 ENCOUNTER — Ambulatory Visit (INDEPENDENT_AMBULATORY_CARE_PROVIDER_SITE_OTHER): Payer: Medicare Other | Admitting: Neurology

## 2020-02-25 VITALS — BP 116/60 | HR 64 | Ht 67.0 in | Wt 185.0 lb

## 2020-02-25 DIAGNOSIS — G3184 Mild cognitive impairment, so stated: Secondary | ICD-10-CM | POA: Diagnosis not present

## 2020-02-25 NOTE — Progress Notes (Signed)
Follow-up Visit   Date: 02/25/20   Mitchell Walker MRN: [id number] DOB: Apr 28, 1934   Interim History: Mitchell Walker is a 86 y.o. right-handed Caucasian male returning to the clinic for follow-up of MCI.  The patient was accompanied to the clinic by wife who also provides collateral information.    History of present illness: Starting around late 2017, his wife began noticing that when he drives, he forgets which roads to turn on and she thinks he may be distracted. He forgets appointments and always repeats confirming the details. She often has to tell him the same information, or he quickly forgets; for instance, when driving, she will tell him to turn on a specific street, but he would forget this unless, she keeps reminding him or tells him with to turn. He rarely misplaces objects. He denies any problems with managing finances or medications. He has some difficulty with word-finding, but not often. He gets turned around when driving, and uses a GPS as needed. His wife stays that he drives too fast and needs to slow down, but she denies any safety concerns. He denies difficulty recognizing faces, but has problems with recalling names.   He drinks 2-3 beers nightly for the past 50 years. He denies paresthesias of the feet. He stays very active socially and started riding a bike lately with friends.   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   UPDATE 02/25/2020:  He is here for follow-up visit. Wife notices mild progression of difficulty with navigational skills when driving, but still drives very well and does not have any safety concerns.  She does not let him drive alone because she's afraid he would get lost.  Otherwise, he remains highly independent and manages his own medication, finances, and enjoys Teacher, music.  He is handy and repairing items.  There has been no significant change over the past year.     Medications:  Current Outpatient Medications on File Prior to Visit  Medication Sig Dispense Refill  . amLODipine (NORVASC) 10 MG tablet Take 1 tablet (10 mg total) by mouth daily. 90 tablet 3  . benazepril-hydrochlorthiazide (LOTENSIN HCT) 20-25 MG tablet Take 1 tablet by mouth daily. 90 tablet 3  . Cholecalciferol (VITAMIN D-3) 125 MCG (5000 UT) TABS Take 1 tablet by mouth daily.    . Omega-3 Fatty Acids (FISH OIL) 1000 MG CAPS Take 1 capsule by mouth daily.    . vitamin B-12 (CYANOCOBALAMIN) 1000 MCG tablet Take 1,000 mcg by mouth daily.     No current facility-administered medications on file prior to visit.    Allergies:  Allergies  Allergen Reactions  . Celecoxib     REACTION: Upset GI  . Sulfonamide Derivatives     REACTION: rash    Vital Signs:  Ht 5\' 7"  (1.702 m)   Wt 185 lb (83.9 kg)   BMI 28.98 kg/m    Neurological Exam: MENTAL STATUS including orientation to time, place, person, recent and remote memory, attention span and concentration, language, and fund of knowledge is normal.  Speech is not dysarthric.    CRANIAL NERVES:  No visual field defects.  Pupils equal round and reactive to light.  Normal conjugate, extra-ocular eye movements in all directions of gaze.  No ptosis.    MOTOR:  Motor strength is 5/5 in all extremities  COORDINATION/GAIT:  Gait narrow based and stable.   Data: Labs 10/22/2016: Vitamin B12 422, RPR neg, TSH 2.34,  HIV NR  MRI brain wo contrast 04/20/2017: 1. No acute intracranial abnormality. 2. Moderate for age chronic microvascular ischemic changes and moderate diffuse parenchymal volume loss of the brain. 3. Moderate diffuse paranasal sinus disease greatest in ethmoid and right sphenoid sinuses.  Neuropsychological testing 06/23/2017:  Amnestic mild cognitive impairment  IMPRESSION/PLAN:  Mild cognitive impairment, stable.  He remains highly independent with IADLs and ADLs, except for navigational difficulty when driving.  His  wife is usually always with him when driving and does not have any safety concerns.  - Repeat neurocognitive testing offered, he will think about it  - Continue to stay physically and mentally active.  Recommend he start walking daily  Return to clinic in 1 year  Thank you for allowing me to participate in patient's care.  If I can answer any additional questions, I would be pleased to do so.    Sincerely,    Peggie Hornak K. Posey Pronto, DO

## 2020-02-25 NOTE — Patient Instructions (Signed)
Return to clinic 1 year  

## 2020-04-28 ENCOUNTER — Other Ambulatory Visit: Payer: Self-pay

## 2020-04-28 ENCOUNTER — Encounter: Payer: Self-pay | Admitting: Family Medicine

## 2020-04-28 ENCOUNTER — Ambulatory Visit (INDEPENDENT_AMBULATORY_CARE_PROVIDER_SITE_OTHER): Payer: Medicare Other | Admitting: Family Medicine

## 2020-04-28 VITALS — BP 120/62 | HR 91 | Temp 98.2°F | Ht 67.0 in | Wt 182.6 lb

## 2020-04-28 DIAGNOSIS — E1159 Type 2 diabetes mellitus with other circulatory complications: Secondary | ICD-10-CM | POA: Diagnosis not present

## 2020-04-28 DIAGNOSIS — I10 Essential (primary) hypertension: Secondary | ICD-10-CM | POA: Diagnosis not present

## 2020-04-28 DIAGNOSIS — R55 Syncope and collapse: Secondary | ICD-10-CM | POA: Diagnosis not present

## 2020-04-28 DIAGNOSIS — E119 Type 2 diabetes mellitus without complications: Secondary | ICD-10-CM

## 2020-04-28 MED ORDER — BLOOD GLUCOSE MONITOR KIT: PACK | 0 refills | Status: DC

## 2020-04-28 NOTE — Addendum Note (Signed)
Addended by: Francella Solian on: 04/28/2020 05:06 PM   Modules accepted: Orders

## 2020-04-28 NOTE — Progress Notes (Signed)
Phone [phone] In person visit   Subjective:   Mitchell Walker is a 86 y.o. year old very pleasant male patient who presents for/with See problem oriented charting Chief Complaint  Patient presents with  . Loss of Consciousness   This visit occurred during the SARS-CoV-2 public health emergency.  Safety protocols were in place, including screening questions prior to the visit, additional usage of staff PPE, and extensive cleaning of exam room while observing appropriate contact time as indicated for disinfecting solutions.   Past Medical History-  Patient Active Problem List   Diagnosis Date Noted  . Advanced directives, counseling/discussion 05/20/2017    Priority: High  . Diabetes mellitus ([place]) 05/20/2017    Priority: High  . B12 deficiency 05/02/2017    Priority: High  . MCI (mild cognitive impairment) 10/22/2016    Priority: High  . Glaucoma 12/27/2017    Priority: Medium  . Hyperlipidemia associated with type 2 diabetes mellitus ([place]) 02/03/2009    Priority: Medium  . Hypertension associated with diabetes ([place]) 07/17/2007    Priority: Medium  . Former smoker 05/21/2015    Priority: Low  . Adenomatous colon polyp 05/21/2015    Priority: Low  . Chronic frontal sinusitis 12/30/2010    Priority: Low  . ACNE ROSACEA 04/15/2010    Priority: Low  . Actinic keratosis 02/03/2009    Priority: Low  . Basal cell carcinoma of skin 02/29/2008    Priority: Low  . ERECTILE DYSFUNCTION 12/11/2007    Priority: Low  . Osteoarthritis 07/17/2007    Priority: Low    Medications- reviewed and updated Current Outpatient Medications  Medication Sig Dispense Refill  . amLODipine (NORVASC) 10 MG tablet Take 1 tablet (10 mg total) by mouth daily. 90 tablet 3  . benazepril-hydrochlorthiazide (LOTENSIN HCT) 20-25 MG tablet Take 1 tablet by mouth daily. 90 tablet 3  . Cholecalciferol (VITAMIN D-3) 125 MCG (5000 UT) TABS Take 1 tablet by mouth daily.    . Omega-3 Fatty Acids (FISH OIL)  1000 MG CAPS Take 1 capsule by mouth daily.    . vitamin B-12 (CYANOCOBALAMIN) 1000 MCG tablet Take 1,000 mcg by mouth daily.     No current facility-administered medications for this visit.     Objective:  BP 120/62   Pulse 91   Temp 98.2 F (36.8 C) (Temporal)   Ht 5\' 7"  (1.702 m)   Wt 182 lb 9.6 oz (82.8 kg)   SpO2 96%   BMI 28.60 kg/m  Gen: NAD, resting comfortably CV: RRR no murmurs rubs or gallops Lungs: CTAB no crackles, wheeze, rhonchi Abdomen: soft/nontender/nondistended/normal bowel sounds.  Ext: no edema Skin: warm, dry  Neuro: CN II-XII intact, sensation and reflexes normal throughout, 5/5 muscle strength in bilateral upper and lower extremities. Normal finger to nose. Normal rapid alternating movements. No pronator drift. Normal romberg. Normal gait.    EKG: sinus bradycardia with rate 58, normal axis, prolonged pr interval- first degree av block, no hypertrophy, does have flattened t wave isolated in v2 (compared to 02/11/10 EKG) but otherwise no st or t wave changes    Assessment and Plan   # Syncope S: Patient was at funeral on Saturday. He stood outside in hot suit and tie for 45 minutes. He was not well hydrated- tried to avoid drinking so wouldn't have to pee.   He had a syncopal event after feeling dizzy- he does not remember passing out- caught by some men behind him- they moved him down to a chair.  Did not hit head. He denies any any symptoms at all- no chest pain or shortness of breath, abnormal fatigue, dizziness. There was an EMT on scene that evaluated him- they cleared him to go home- friends drove him home.    He was standing outside at funeral service in full suite and tie. He came home after and just felt fatigue.  No symptoms after.   Cbg: he had eaten that day. Does not have a metter to check sugar  BP: did not have blood pressure checked at time of the episode A/P: This certainly sounds like a case of vasovagal syncope-patient had avoided  drinking before the funeral intentionally and was likely dehydrated, he was in a full suit outdoors as well and was likely sweating a fair amount.  We will get an updated EKG as well as blood work as below. -ekg with isolated t wave flattening in v2- I do not think we need to do further cardiac workup at this time. Also first dgree av block and sinus bradycardia- mild issues- doubt cause of syncope- still more likely vasovaglal  Neurological exam was reassuring today-doubt CVA.  We discussed possibility of more advanced testing like carotid duplex, echocardiogram, CT or MRI-we jointly agreed to hold off for now unless recurrent symptoms  No history of seizure-doubt seizure  #hypertension S: medication: amlodipine 10mg  and benazepril hctz 20-25mg  BP Readings from Last 3 Encounters:  04/28/20 120/62  02/25/20 116/60  01/28/20 130/68  A/P: Excellent control today-continue current medications-given no change in position from heavy episode this weekend I doubt orthostatic hypotension was the cause  # Diabetes S: Medication:none  CBGs- does not check, will give meter to have on hand Exercise and diet- exercise limited. Weight stable- stable diet Lab Results  Component Value Date   HGBA1C 5.9 (A) 01/28/2020   HGBA1C 6.0 06/27/2019   HGBA1C 5.9 (A) 12/27/2018   A/P: Hopefully diabetes remains controlled.  I would like for him to have a meter on hand in case he has another incident like this we can check his blood sugar-Joellen will train today on how to use this  # Knot on arm S: knot on right arm. Started out as crusty sore about 2 weeks ago. Now has head on it. Tender to touch. Denies any discharge.  A/P: This looks like a possible squamous cell carcinoma to me-he saw dermatology about 2 months ago and it was not this large and did not look like this-I would like for them to go back to dermatology to make sure/consider biopsy   Recommended follow up: You are scheduled for follow-up on  August 17-since we are checking A1c today and we have to wait 3 months and 1 day between A1c's-lets push that back at least 1 day to 1 month. Future Appointments  Date Time Provider [place]  07/29/2020  9:40 AM [name], MD [facility]  02/27/2021 10:30 AM Narda Amber K, DO [facility] None   Lab/Order associations:   ICD-10-CM   1. Syncope, unspecified syncope type  R55 EKG 12-Lead  2. Type 2 diabetes mellitus without complication, without long-term current use of insulin (HCC)  E11.9 CBC with Differential/Platelet    Comprehensive metabolic panel    Hemoglobin A1c  3. Hypertension associated with diabetes ([place])  E11.59    I10    Return precautions advised.  Garret Reddish, MD

## 2020-04-28 NOTE — Patient Instructions (Addendum)
Thanks for doing labs today-I suspect diabetes is well controlled  EKG today was reassuring  If you have recurrent episodes of passing out-seek care immediately.  If you have other new or worsening symptoms please let us know  Recommended follow up: You are scheduled for follow-up on August 17-since we are checking A1c today and we have to wait 3 months and 1 day between A1c's-lets push that back at least 1 day to 1 month.  Team-please get him set up with a glucometer

## 2020-04-29 LAB — COMPREHENSIVE METABOLIC PANEL
ALT: 13 U/L (ref 0–53)
AST: 18 U/L (ref 0–37)
Albumin: 4.4 g/dL (ref 3.5–5.2)
Alkaline Phosphatase: 75 U/L (ref 39–117)
BUN: 11 mg/dL (ref 6–23)
CO2: 30 mEq/L (ref 19–32)
Calcium: 8.9 mg/dL (ref 8.4–10.5)
Chloride: 96 mEq/L (ref 96–112)
Creatinine, Ser: 0.95 mg/dL (ref 0.40–1.50)
GFR: 75.57 mL/min (ref >60.00)
Glucose, Bld: 155 mg/dL — ABNORMAL HIGH (ref 70–99)
Potassium: 3.5 mEq/L (ref 3.5–5.1)
Sodium: 132 mEq/L — ABNORMAL LOW (ref 135–145)
Total Bilirubin: 0.7 mg/dL (ref 0.2–1.2)
Total Protein: 7.2 g/dL (ref 6.0–8.3)

## 2020-04-29 LAB — CBC WITH DIFFERENTIAL/PLATELET
Basophils Absolute: 0.1 10*3/uL (ref 0.0–0.1)
Basophils Relative: 1.3 % (ref 0.0–3.0)
Eosinophils Absolute: 0.2 10*3/uL (ref 0.0–0.7)
Eosinophils Relative: 3.9 % (ref 0.0–5.0)
HCT: 39.2 % (ref 39.0–52.0)
Hemoglobin: 13.7 g/dL (ref 13.0–17.0)
Lymphocytes Relative: 34.6 % (ref 12.0–46.0)
Lymphs Abs: 2.2 10*3/uL (ref 0.7–4.0)
MCHC: 34.8 g/dL (ref 30.0–36.0)
MCV: 86.1 fl (ref 78.0–100.0)
Monocytes Absolute: 0.6 10*3/uL (ref 0.1–1.0)
Monocytes Relative: 10.3 % (ref 3.0–12.0)
Neutro Abs: 3.1 10*3/uL (ref 1.4–7.7)
Neutrophils Relative %: 49.9 % (ref 43.0–77.0)
Platelets: 232 10*3/uL (ref 150.0–400.0)
RBC: 4.55 Mil/uL (ref 4.22–5.81)
RDW: 13.4 % (ref 11.5–15.5)
WBC: 6.3 10*3/uL (ref 4.0–10.5)

## 2020-04-29 LAB — HEMOGLOBIN A1C: Hgb A1c MFr Bld: 6.3 % (ref 4.6–6.5)

## 2020-04-30 ENCOUNTER — Ambulatory Visit: Payer: Medicare Other | Admitting: Neurology

## 2020-05-01 ENCOUNTER — Ambulatory Visit: Payer: Medicare Other | Admitting: Neurology

## 2020-05-06 DIAGNOSIS — C44622 Squamous cell carcinoma of skin of right upper limb, including shoulder: Secondary | ICD-10-CM | POA: Diagnosis not present

## 2020-05-06 DIAGNOSIS — Z85828 Personal history of other malignant neoplasm of skin: Secondary | ICD-10-CM | POA: Diagnosis not present

## 2020-05-06 DIAGNOSIS — L570 Actinic keratosis: Secondary | ICD-10-CM | POA: Diagnosis not present

## 2020-06-30 ENCOUNTER — Ambulatory Visit (INDEPENDENT_AMBULATORY_CARE_PROVIDER_SITE_OTHER): Payer: Medicare Other

## 2020-06-30 DIAGNOSIS — Z0000 Encounter for general adult medical examination without abnormal findings: Secondary | ICD-10-CM

## 2020-06-30 NOTE — Progress Notes (Signed)
Virtual Visit via Telephone Note  I connected with  [name] on 06/30/20 at  8:45 AM EDT by telephone and verified that I am speaking with the correct person using two identifiers.  Medicare Annual Wellness visit completed telephonically due to Covid-19 pandemic.   Persons participating in this call: This Health Coach, patient and wife [name]  Location: Patient: Home Provider: Office   I discussed the limitations, risks, security and privacy concerns of performing an evaluation and management service by telephone and the availability of in person appointments. The patient expressed understanding and agreed to proceed.  Unable to perform video visit due to video visit attempted and failed and/or patient does not have video capability.   Some vital signs may be absent or patient reported.   Willette Brace, LPN    Subjective:   MORRILL BOMKAMP is a 86 y.o. male who presents for an Initial Medicare Annual Wellness Visit.  Review of Systems     Cardiac Risk Factors include: diabetes mellitus;dyslipidemia;hypertension;male gender     Objective:    There were no vitals filed for this visit. There is no height or weight on file to calculate BMI.  Advanced Directives 06/30/2020 02/25/2020 06/07/2017 05/20/2017  Does Patient Have a Medical Advance Directive? Yes Yes Yes Yes  Type of Paramedic of [place];Living will [place];Living will - -  Does patient want to make changes to medical advance directive? - - No - Patient declined -  Copy of [place] in Chart? Yes - validated most recent copy scanned in chart (See row information) - - -    Current Medications (verified) Outpatient Encounter Medications as of 06/30/2020  Medication Sig   amLODipine (NORVASC) 10 MG tablet Take 1 tablet (10 mg total) by mouth daily.   benazepril-hydrochlorthiazide (LOTENSIN HCT) 20-25 MG tablet Take 1 tablet by mouth daily.    Cholecalciferol (VITAMIN D-3) 125 MCG (5000 UT) TABS Take 1 tablet by mouth daily.   Omega-3 Fatty Acids (FISH OIL) 1000 MG CAPS Take 1 capsule by mouth daily.   vitamin B-12 (CYANOCOBALAMIN) 1000 MCG tablet Take 1,000 mcg by mouth daily.   [DISCONTINUED] blood glucose meter kit and supplies KIT Dispense based on patient and insurance preference. Use up to four times daily as directed. (FOR ICD-9 250.00, 250.01). (Patient not taking: Reported on 06/30/2020)   No facility-administered encounter medications on file as of 06/30/2020.    Allergies (verified) Celecoxib and Sulfonamide derivatives   History: Past Medical History:  Diagnosis Date   EXTERNAL HEMORRHOIDS, THROMBOSED 08/12/2010   Hypertension    Osteoarthritis    Rosacea, acne    ROTATOR CUFF TEAR 02/06/2008   Worked through with PT     Tubular adenoma of colon 02/2010   Past Surgical History:  Procedure Laterality Date   GLAUCOMA SURGERY     JOINT REPLACEMENT     total knee replacement biltateral   Family History  Problem Relation Age of Onset   Dementia Mother    Heart attack Father        in his 64s, sedentary   Osteoporosis Sister    Colon cancer Maternal Grandfather    Esophageal cancer Neg Hx    Stomach cancer Neg Hx    Rectal cancer Neg Hx    Social History   Socioeconomic History   Marital status: Married    Spouse name: Not on file   Number of children: 2   Years of education:  Not on file   Highest education level: Not on file  Occupational History   Occupation: Retired  Tobacco Use   Smoking status: Former Smoker    Packs/day: 1.00    Years: 25.00    Pack years: 25.00    Types: Cigarettes    Quit date: 01/27/1974    Years since quitting: 46.4   Smokeless tobacco: Never Used   Tobacco comment: quit smoking 40 years ago  Vaping Use   Vaping Use: Never used  Substance and Sexual Activity   Alcohol use: Yes    Alcohol/week: 7.0 standard drinks    Types: 7 Standard  drinks or equivalent per week    Comment: 2-3 beers nightly   Drug use: No   Sexual activity: Yes  Other Topics Concern   Not on file  Social History Narrative   Designated Party Release signed on 04/29/10      Married (72 years in 2016-wife patient of Dr. Maudie Mercury), 2 children, 4 grandkids      Retired from [employer] before then      Hobbies: exercise/biking, avid bass fisherman, yardwork      Highest level of education:  High school      Right handed   One story home    Social Determinants of Health   Financial Resource Strain: Low Risk    Difficulty of Paying Living Expenses: Not hard at all  Food Insecurity: No Food Insecurity   Worried About Charity fundraiser in the Last Year: Never true   Arboriculturist in the Last Year: Never true  Transportation Needs: No Transportation Needs   Lack of Transportation (Medical): No   Lack of Transportation (Non-Medical): No  Physical Activity: Inactive   Days of Exercise per Week: 0 days   Minutes of Exercise per Session: 0 min  Stress: No Stress Concern Present   Feeling of Stress : Not at all  Social Connections: Moderately Integrated   Frequency of Communication with Friends and Family: More than three times a week   Frequency of Social Gatherings with Friends and Family: Once a week   Attends Religious Services: Never   Marine scientist or Organizations: Yes   Attends Music therapist: 1 to 4 times per year   Marital Status: Married    Tobacco Counseling Counseling given: Not Answered Comment: quit smoking 40 years ago   Clinical Intake:  Pre-visit preparation completed: Yes  Pain : No/denies pain     BMI - recorded: 28.6 Nutritional Status: BMI 25 -29 Overweight Diabetes: Yes CBG done?: No Did pt. bring in CBG monitor from home?: No  How often do you need to have someone help you when you read instructions,  pamphlets, or other written materials from your doctor or pharmacy?: 1 - Never  Diabetic?Yes  Interpreter Needed?: No  Information entered by :: Charlott Rakes, LPN   Activities of Daily Living In your present state of health, do you have any difficulty performing the following activities: 06/30/2020 01/28/2020  Hearing? Y Y  Comment wear hearing aids has hearing aid  Vision? N N  Difficulty concentrating or making decisions? N Y  Comment - short memory issues  Walking or climbing stairs? N N  Dressing or bathing? N N  Doing errands, shopping? N N  Preparing Food and eating ? N -  Using the Toilet? N -  In the past six months, have you  accidently leaked urine? N -  Do you have problems with loss of bowel control? N -  Managing your Medications? N -  Managing your Finances? N -  Housekeeping or managing your Housekeeping? N -  Some recent data might be hidden    Patient Care Team: [name], MD as PCP - General (Family Medicine) Alda Berthold, DO as Consulting Physician (Neurology) [name], MD as Consulting Physician (Dermatology)  Indicate any recent Medical Services you may have received from other than [facility] providers in the past year (date may be approximate).     Assessment:   This is a routine wellness examination for [place].  Hearing/Vision screen  Hearing Screening   _0  _1  _2  _3  _4  _5  _6  _7  _8   Right ear:           Left ear:           Comments: Wears hearing aids   Vision Screening Comments: Wears readers and had cataracts in both eyes follows up annually with dr Kathrin Penner  Dietary issues and exercise activities discussed: Current Exercise Habits: The patient does not participate in regular exercise at present  Goals     Patient Stated     Stay healthy      Weight (lb) < 188 lb (85.3 kg)     Would stop sugar Cut back on bread  Check out  online nutrition programs as GumSearch.nl and  http://vang.com/; fit47m; Loseit or calorieking.com  Look for foods with "whole" wheat; bran; oatmeal etc Shot at the farmer's markets in season for fresher choices  Watch for "hydrogenated" on the label of oils which are trans-fats.  Watch for "high fructose corn syrup" in snacks, yogurt or ketchup  Meats have less marbling; bright colored fruits and vegetables;  Canned; dump out liquid and wash vegetables. Be mindful of what we are eating  Portion control is essential to a health weight! Sit down; take a break and enjoy your meal; take smaller bites; put the fork down between bites;  It takes 20 minutes to get full; so check in with your fullness cues and stop eating when you start to fill full   Serving Sizes A serving size is a measured amount of food or drink, such as one slice of bread, that has an associated nutrient content. Knowing the serving size of a food or drink can help you determine how much of that food you should consume. What is the size of one serving? The size of one healthy serving depends on the food or drink. To determine a serving size, read the food label. If the food or drink does not have a food label, try to find serving size information online. Or, use the following to estimate the size of one adult serving: Grain 1 slice bread.  bagel.  cup pasta. Vegetable  cup cooked or canned vegetables. 1 cup raw, leafy greens. Fruit  cup canned fruit. 1 medium fruit.  cup dried fruit. Meat and Other Protein Sources 1 oz meat, poultry, or fish.  cup cooked beans. 1 egg.  cup nuts or seeds. 1 Tbsp nut butter.  cup tofu or tempeh. 2 Tbsp hummus. Dairy An individual container of yogurt (6-8 oz). 1 piece of cheese the size of your thumb (1 oz). 1 cup (8 oz) milk or milk alternative. Fat A piece the size of one dice. 1 tsp soft margarine. 1 Tbsp mayonnaise. 1 tsp vegetable oil. 1 Tbsp regular salad dressing. 2 Tbsp low-fat salad dressing.  How many servings should I  eat from each food group each day? The following are the suggested number of servings to try and have every day from each food group. You can also look at your eating throughout the week and aim for meeting these requirements on most days for overall healthy eating. Grain 6-8 servings. Try to have half of your grains from whole grains, such as whole wheat bread, corn tortillas, oatmeal, brown rice, whole wheat pasta, and bulgur. Vegetable At least 2-3 servings. Fruit 2 servings. Meat and Other Protein Foods 5-6 servings. Aim to have lean proteins, such as chicken, [place], fish, beans, or tofu. Dairy 3 servings. Choose low-fat or nonfat if you are trying to control your weight. Fat 2-3 servings. Is a serving the same thing as a portion? No. A portion is the actual amount you eat, which may be more than one serving. Knowing the specific serving size of a food and the nutritional information that goes with it can help you make a healthy decision on what size portion to eat. What are some tips to help me learn healthy serving sizes?  Check food labels for serving sizes. Many foods that come as a single portion actually contain multiple servings.  Determine the serving size of foods you commonly eat and figure out how large a portion you usually eat.  Measure the number of servings that can be held by the bowls, glasses, cups, and plates you typically use. For example, pour your breakfast cereal into your regular bowl and then pour it into a measuring cup.  For 1-2 days, measure the serving sizes of all the foods you eat.  Practice estimating serving sizes and determining how big your portions should be. This information is not intended to replace advice given to you by your health care provider. Make sure you discuss any questions you have with your health care provider. Document Released: 08/28/2003 Document Revised: 07/24/2016 Document Reviewed: 02/26/2014 Elsevier Interactive Patient  Education  2018 [place] 2/9 Scores 06/30/2020 04/28/2020 06/27/2019 12/27/2018 06/26/2018 06/07/2017 05/20/2017  PHQ - 2 Score 0 0 0 0 0 0 0  PHQ- 9 Score - 0 - - - - -    Fall Risk Fall Risk  06/30/2020 02/25/2020 01/28/2020 11/07/2019 06/27/2019  Falls in the past year? 1 0 0 0 0  Comment - - - Emmi Telephone Survey: data to providers prior to load -  Number falls in past yr: 1 0 0 - 0  Comment fell out at funeral no injury - - - -  Injury with Fall? 0 0 0 - 0  Risk for fall due to : Impaired vision - - - -  Follow up Falls prevention discussed - - - -    Any stairs in or around the home? Yes  If so, are there any without handrails? Yes  Home free of loose throw rugs in walkways, pet beds, electrical cords, etc? Yes  Adequate lighting in your home to reduce risk of falls? Yes   ASSISTIVE DEVICES UTILIZED TO PREVENT FALLS:  Life alert? No  Use of a cane, walker or w/c? No  Grab bars in the bathroom? No  Shower chair or bench in shower? No  Elevated toilet seat or a handicapped toilet? No   TIMED UP AND GO:  Was the test performed? No .   Cognitive Function: MMSE - Mini Mental State Exam 05/20/2017  Not  completed: (No Data)   Montreal Cognitive Assessment  04/19/2019 04/24/2018 04/11/2017  Visuospatial/ Executive (0/5) _0 Naming (0/3) _1 Attention: Read list of digits (0/2) _2 Attention: Read list of letters (0/1) _3 Attention: Serial 7 subtraction starting at 100 (0/3) _4 Language: Repeat phrase (0/2) _5 Language : Fluency (0/1) _6 Abstraction (0/2) _7 Delayed Recall (0/5) 1 1 0  Orientation (0/6) _8 Total _9 Adjusted Score (based on education) _10 6CIT Screen 06/30/2020  What Year? 0 points  What month? 0 points  What time? 0 points  Count back from 20 0 points  Months in reverse 0 points  Repeat phrase 6 points  Total Score 6    Immunizations Immunization History    Administered Date(s) Administered   Fluad Quad(high Dose 65+) 09/25/2019   Influenza Split 11/18/2011, 11/01/2012   Influenza Whole 09/01/2010   Influenza, High Dose Seasonal PF 09/18/2014, 10/28/2015, 10/12/2016, 10/17/2017, 10/09/2018   Influenza,inj,Quad PF,6+ Mos 09/11/2013   PFIZER SARS-COV-2 Vaccination 12/22/2019, 01/12/2020   Pneumococcal Conjugate-13 05/21/2015   Pneumococcal Polysaccharide-23 12/13/2004, 02/11/2010   Td 12/14/2003   Tdap 09/29/2017   Tetanus 05/20/2014   Zoster 03/04/2010    TDAP status: Up to date Flu Vaccine status: Up to date Pneumococcal vaccine status: Up to date Covid-19 vaccine status: Completed vaccines  Qualifies for Shingles Vaccine? Yes   Zostavax completed Yes   Shingrix Completed?: No.    Education has been provided regarding the importance of this vaccine. Patient has been advised to call insurance company to determine out of pocket expense if they have not yet received this vaccine. Advised may also receive vaccine at local pharmacy or Health Dept. Verbalized acceptance and understanding.  Screening Tests Health Maintenance  Topic Date Due   INFLUENZA VACCINE  07/13/2020   COLONOSCOPY  08/03/2020   HEMOGLOBIN A1C  10/29/2020   OPHTHALMOLOGY EXAM  01/20/2021   FOOT EXAM  01/27/2021   TETANUS/TDAP  09/30/2027   COVID-19 Vaccine  Completed   PNA vac Low Risk Adult  Completed    Health Maintenance  There are no preventive care reminders to display for this patient.  Colorectal cancer screening: No longer required.     Additional Screening:    Vision Screening: Recommended annual ophthalmology exams for early detection of glaucoma and other disorders of the eye. Is the patient up to date with their annual eye exam?  Yes  Who is the provider or what is the name of the office in which the patient attends annual eye exams? Dr Juanito Doom   Dental Screening: Recommended annual dental exams for proper  oral hygiene  Community Resource Referral / Chronic Care Management: CRR required this visit?  No   CCM required this visit?  No      Plan:     I have personally reviewed and noted the following in the patients chart:    Medical and social history  Use of alcohol, tobacco or illicit drugs   Current medications and supplements  Functional ability and status  Nutritional status  Physical activity  Advanced directives  List of other physicians  Hospitalizations, surgeries, and ER visits in previous 12 months  Vitals  Screenings to include cognitive, depression, and falls  Referrals and appointments  In addition, I have reviewed and discussed with patient certain preventive protocols,  quality metrics, and best practice recommendations. A written personalized care plan for preventive services as well as general preventive health recommendations were provided to patient.     Willette Brace, LPN   06/14/5008   Nurse Notes: None

## 2020-06-30 NOTE — Progress Notes (Signed)
I have personally reviewed the Medicare Annual Wellness Visit and agree with the documentation.  This is a subsequent wellness visit.   Algis Greenhouse. Jerline Pain, MD 06/30/2020 10:41 AM

## 2020-06-30 NOTE — Addendum Note (Signed)
Addended by: Vivi Barrack on: 06/30/2020 10:42 AM   Modules accepted: Level of Service

## 2020-06-30 NOTE — Patient Instructions (Addendum)
Mitchell Walker , Thank you for taking time to come for your Medicare Wellness Visit. I appreciate your ongoing commitment to your health goals. Please review the following plan we discussed and let me know if I can assist you in the future.   Screening recommendations/referrals: Colonoscopy: Done 08/03/18 Recommended yearly ophthalmology/optometry visit for glaucoma screening and checkup Recommended yearly dental visit for hygiene and checkup  Vaccinations: Influenza vaccine: Done  Pneumococcal vaccine: Up to date Tdap vaccine: Up to date Shingles vaccine: Shingrix discussed. Please contact your pharmacy for coverage information.    Covid-19: Completed 1/9 & 01/12/20  Advanced directives: Copy in chart  Conditions/risks identified: Stay healthy  Next appointment: Follow up in one year for your annual wellness visit.   Preventive Care 40 Years and Older, Male Preventive care refers to lifestyle choices and visits with your health care provider that can promote health and wellness. What does preventive care include?  A yearly physical exam. This is also called an annual well check.  Dental exams once or twice a year.  Routine eye exams. Ask your health care provider how often you should have your eyes checked.  Personal lifestyle choices, including:  Daily care of your teeth and gums.  Regular physical activity.  Eating a healthy diet.  Avoiding tobacco and drug use.  Limiting alcohol use.  Practicing safe sex.  Taking low doses of aspirin every day.  Taking vitamin and mineral supplements as recommended by your health care provider. What happens during an annual well check? The services and screenings done by your health care provider during your annual well check will depend on your age, overall health, lifestyle risk factors, and family history of disease. Counseling  Your health care provider may ask you questions about your:  Alcohol use.  Tobacco use.  Drug  use.  Emotional well-being.  Home and relationship well-being.  Sexual activity.  Eating habits.  History of falls.  Memory and ability to understand (cognition).  Work and work Statistician. Screening  You may have the following tests or measurements:  Height, weight, and BMI.  Blood pressure.  Lipid and cholesterol levels. These may be checked every 5 years, or more frequently if you are over 6 years old.  Skin check.  Lung cancer screening. You may have this screening every year starting at age 74 if you have a 30-pack-year history of smoking and currently smoke or have quit within the past 15 years.  Fecal occult blood test (FOBT) of the stool. You may have this test every year starting at age 56.  Flexible sigmoidoscopy or colonoscopy. You may have a sigmoidoscopy every 5 years or a colonoscopy every 10 years starting at age 60.  Prostate cancer screening. Recommendations will vary depending on your family history and other risks.  Hepatitis C blood test.  Hepatitis B blood test.  Sexually transmitted disease (STD) testing.  Diabetes screening. This is done by checking your blood sugar (glucose) after you have not eaten for a while (fasting). You may have this done every 1-3 years.  Abdominal aortic aneurysm (AAA) screening. You may need this if you are a current or former smoker.  Osteoporosis. You may be screened starting at age 72 if you are at high risk. Talk with your health care provider about your test results, treatment options, and if necessary, the need for more tests. Vaccines  Your health care provider may recommend certain vaccines, such as:  Influenza vaccine. This is recommended every year.  Tetanus, diphtheria,  and acellular pertussis (Tdap, Td) vaccine. You may need a Td booster every 10 years.  Zoster vaccine. You may need this after age 81.  Pneumococcal 13-valent conjugate (PCV13) vaccine. One dose is recommended after age  29.  Pneumococcal polysaccharide (PPSV23) vaccine. One dose is recommended after age 35. Talk to your health care provider about which screenings and vaccines you need and how often you need them. This information is not intended to replace advice given to you by your health care provider. Make sure you discuss any questions you have with your health care provider. Document Released: 12/26/2015 Document Revised: 08/18/2016 Document Reviewed: 09/30/2015 Elsevier Interactive Patient Education  2017 [employer] Prevention in the Home Falls can cause injuries. They can happen to people of all ages. There are many things you can do to make your home safe and to help prevent falls. What can I do on the outside of my home?  Regularly fix the edges of walkways and driveways and fix any cracks.  Remove anything that might make you trip as you walk through a door, such as a raised step or threshold.  Trim any bushes or trees on the path to your home.  Use bright outdoor lighting.  Clear any walking paths of anything that might make someone trip, such as rocks or tools.  Regularly check to see if handrails are loose or broken. Make sure that both sides of any steps have handrails.  Any raised decks and porches should have guardrails on the edges.  Have any leaves, snow, or ice cleared regularly.  Use sand or salt on walking paths during winter.  Clean up any spills in your garage right away. This includes oil or grease spills. What can I do in the bathroom?  Use night lights.  Install grab bars by the toilet and in the tub and shower. Do not use towel bars as grab bars.  Use non-skid mats or decals in the tub or shower.  If you need to sit down in the shower, use a plastic, non-slip stool.  Keep the floor dry. Clean up any water that spills on the floor as soon as it happens.  Remove soap buildup in the tub or shower regularly.  Attach bath mats securely with double-sided  non-slip rug tape.  Do not have throw rugs and other things on the floor that can make you trip. What can I do in the bedroom?  Use night lights.  Make sure that you have a light by your bed that is easy to reach.  Do not use any sheets or blankets that are too big for your bed. They should not hang down onto the floor.  Have a firm chair that has side arms. You can use this for support while you get dressed.  Do not have throw rugs and other things on the floor that can make you trip. What can I do in the kitchen?  Clean up any spills right away.  Avoid walking on wet floors.  Keep items that you use a lot in easy-to-reach places.  If you need to reach something above you, use a strong step stool that has a grab bar.  Keep electrical cords out of the way.  Do not use floor polish or wax that makes floors slippery. If you must use wax, use non-skid floor wax.  Do not have throw rugs and other things on the floor that can make you trip. What can I do with my  stairs?  Do not leave any items on the stairs.  Make sure that there are handrails on both sides of the stairs and use them. Fix handrails that are broken or loose. Make sure that handrails are as long as the stairways.  Check any carpeting to make sure that it is firmly attached to the stairs. Fix any carpet that is loose or worn.  Avoid having throw rugs at the top or bottom of the stairs. If you do have throw rugs, attach them to the floor with carpet tape.  Make sure that you have a light switch at the top of the stairs and the bottom of the stairs. If you do not have them, ask someone to add them for you. What else can I do to help prevent falls?  Wear shoes that:  Do not have high heels.  Have rubber bottoms.  Are comfortable and fit you well.  Are closed at the toe. Do not wear sandals.  If you use a stepladder:  Make sure that it is fully opened. Do not climb a closed stepladder.  Make sure that both  sides of the stepladder are locked into place.  Ask someone to hold it for you, if possible.  Clearly mark and make sure that you can see:  Any grab bars or handrails.  First and last steps.  Where the edge of each step is.  Use tools that help you move around (mobility aids) if they are needed. These include:  Canes.  Walkers.  Scooters.  Crutches.  Turn on the lights when you go into a dark area. Replace any light bulbs as soon as they burn out.  Set up your furniture so you have a clear path. Avoid moving your furniture around.  If any of your floors are uneven, fix them.  If there are any pets around you, be aware of where they are.  Review your medicines with your doctor. Some medicines can make you feel dizzy. This can increase your chance of falling. Ask your doctor what other things that you can do to help prevent falls. This information is not intended to replace advice given to you by your health care provider. Make sure you discuss any questions you have with your health care provider. Document Released: 09/25/2009 Document Revised: 05/06/2016 Document Reviewed: 01/03/2015 Elsevier Interactive Patient Education  2017 [employer].

## 2020-07-09 ENCOUNTER — Telehealth: Payer: Self-pay | Admitting: Family Medicine

## 2020-07-09 NOTE — Progress Notes (Signed)
  Chronic Care Management   Note  07/09/2020 Name: Mitchell Walker MRN: [id number] DOB: 12-20-1933  Mitchell Walker is a 86 y.o. year old male who is a primary care patient of Yong Channel, Brayton Mars, MD. I reached out to Mitchell Walker by phone today in response to a referral sent by [name]'s PCP, [name], MD.   [name] was given information about Chronic Care Management services today including:  1. CCM service includes personalized support from designated clinical staff supervised by his physician, including individualized plan of care and coordination with other care providers 2. 24/7 contact phone numbers for assistance for urgent and routine care needs. 3. Service will only be billed when office clinical staff spend 20 minutes or more in a month to coordinate care. 4. Only one practitioner may furnish and bill the service in a calendar month. 5. The patient may stop CCM services at any time (effective at the end of the month) by phone call to the office staff.   Patient agreed to services and verbal consent obtained.   Follow up plan:   Earney [place] [facility] Scheduler

## 2020-07-26 ENCOUNTER — Other Ambulatory Visit: Payer: Self-pay | Admitting: Family Medicine

## 2020-07-29 ENCOUNTER — Ambulatory Visit: Payer: Medicare Other | Admitting: Family Medicine

## 2020-08-12 ENCOUNTER — Other Ambulatory Visit: Payer: Self-pay

## 2020-08-12 DIAGNOSIS — E119 Type 2 diabetes mellitus without complications: Secondary | ICD-10-CM

## 2020-08-12 DIAGNOSIS — I10 Essential (primary) hypertension: Secondary | ICD-10-CM

## 2020-08-12 DIAGNOSIS — E785 Hyperlipidemia, unspecified: Secondary | ICD-10-CM

## 2020-08-12 DIAGNOSIS — E1159 Type 2 diabetes mellitus with other circulatory complications: Secondary | ICD-10-CM

## 2020-08-19 ENCOUNTER — Ambulatory Visit: Payer: Medicare Other

## 2020-08-20 ENCOUNTER — Ambulatory Visit: Payer: Medicare Other

## 2020-08-20 ENCOUNTER — Other Ambulatory Visit: Payer: Self-pay

## 2020-08-20 DIAGNOSIS — E785 Hyperlipidemia, unspecified: Secondary | ICD-10-CM

## 2020-08-20 DIAGNOSIS — E1159 Type 2 diabetes mellitus with other circulatory complications: Secondary | ICD-10-CM

## 2020-08-20 DIAGNOSIS — E119 Type 2 diabetes mellitus without complications: Secondary | ICD-10-CM

## 2020-08-20 NOTE — Progress Notes (Signed)
Chronic Care Management Pharmacy  Name: Mitchell Walker  MRN: [id number] DOB: November 16, 1934  Chief Complaint/ HPI  Mitchell Walker,  86 y.o., male presents for their Initial CCM visit with the clinical pharmacist via telephone due to COVID-19 Pandemic.  PCP : [name], MD  Chronic conditions include:  Encounter Diagnoses  Name Primary?  . Type 2 diabetes mellitus without complication, without long-term current use of insulin ([place]) Yes  . Hypertension associated with diabetes ([place])   . Hyperlipidemia associated with type 2 diabetes mellitus ([place])    Office Visits:  04/28/2020 (PCP): Syncope while outside at funeral, was not well hydrated had not eaten. Unlikely orthostatic hypotension as cause. Provided with meter to check BG. F/u with Dr. Yong Channel 09/2020.  Patient Active Problem List   Diagnosis Date Noted  . Glaucoma 12/27/2017  . Advanced directives, counseling/discussion 05/20/2017  . Diabetes mellitus ([place]) 05/20/2017  . B12 deficiency 05/02/2017  . MCI (mild cognitive impairment) 10/22/2016  . Former smoker 05/21/2015  . Adenomatous colon polyp 05/21/2015  . Chronic frontal sinusitis 12/30/2010  . ACNE ROSACEA 04/15/2010  . Hyperlipidemia associated with type 2 diabetes mellitus ([place]) 02/03/2009  . Actinic keratosis 02/03/2009  . Basal cell carcinoma of skin 02/29/2008  . ERECTILE DYSFUNCTION 12/11/2007  . Hypertension associated with diabetes ([place]) 07/17/2007  . Osteoarthritis 07/17/2007   Past Surgical History:  Procedure Laterality Date  . GLAUCOMA SURGERY    . JOINT REPLACEMENT     total knee replacement biltateral   Family History  Problem Relation Age of Onset  . Dementia Mother   . Heart attack Father        in his 48s, sedentary  . Osteoporosis Sister   . Colon cancer Maternal Grandfather   . Esophageal cancer Neg Hx   . Stomach cancer Neg Hx   . Rectal cancer Neg Hx    Allergies  Allergen Reactions  . Celecoxib     REACTION: Upset GI  .  Sulfonamide Derivatives     REACTION: rash   Outpatient Encounter Medications as of 08/20/2020  Medication Sig  . amLODipine (NORVASC) 10 MG tablet Take 1 tablet by mouth once daily  . benazepril-hydrochlorthiazide (LOTENSIN HCT) 20-25 MG tablet Take 1 tablet by mouth once daily  . Cholecalciferol (VITAMIN D-3) 125 MCG (5000 UT) TABS Take 1 tablet by mouth daily.  . Omega-3 Fatty Acids (FISH OIL) 1000 MG CAPS Take 1 capsule by mouth daily.  . vitamin B-12 (CYANOCOBALAMIN) 1000 MCG tablet Take 1,000 mcg by mouth daily.   No facility-administered encounter medications on file as of 08/20/2020.   Patient Care Team    Relationship Specialty Notifications Start End  [name], MD PCP - General Family Medicine  09/18/14    Comment: Ronie Spies, Delena Serve, DO Consulting Physician Neurology  04/25/19   [name], MD Consulting Physician Dermatology  01/28/20   [name], [facility] Pharmacist Pharmacist  07/09/20    Comment: [phone]   Current Diagnosis/Assessment: Goals Addressed            This Visit's Progress   . PharmD Care Plan       CARE PLAN ENTRY (see longitudinal plan of care for additional care plan information)  Current Barriers:  . Chronic Disease Management support, education, and care coordination needs related to Hypertension, Hyperlipidemia, and Diabetes   Hypertension BP Readings from Last 3 Encounters:  04/28/20 120/62  02/25/20 116/60  01/28/20 130/68   . Pharmacist Clinical Goal(s): o  Over the next 365 days, patient will work with PharmD and providers to maintain BP goal <140/90 . Current regimen:  o Amlodipine 10 mg once daily o Lotensin 20-25 mg once daily  . Interventions: o Diet/exercise recommendations . Patient self care activities - Over the next 365 days, patient will: o Check BP at least once every 1-2 weeks, document, and provide at future appointments o Ensure daily salt intake < 2300 mg/day  Hyperlipidemia Lab Results  Component  Value Date/Time   LDLCALC 117 (H) 06/27/2019 08:37 AM   LDLDIRECT 127.3 02/11/2010 11:35 AM   . Pharmacist Clinical Goal(s): o Over the next 365 days, patient will work with PharmD and providers to achieve LDL goal < 100 . Current regimen:  o N/a  . Interventions: o Diet/exercise recommendations  . Patient self care activities - Over the next 365 days, patient will:  Diabetes Lab Results  Component Value Date/Time   HGBA1C 6.3 04/28/2020 04:12 PM   HGBA1C 5.9 (A) 01/28/2020 10:11 AM   HGBA1C 6.0 06/27/2019 08:37 AM   . Pharmacist Clinical Goal(s): o Over the next 365 days, patient will work with PharmD and providers to maintain A1c goal <6.5% . Current regimen:  o N/a . Interventions: o Diet/exercise recommendations . Patient self care activities - Over the next 365 days, patient will: o Check blood sugar as directed, document, and provide at future appointments o Contact provider with any episodes of hypoglycemia  Medication management . Pharmacist Clinical Goal(s): o Over the next 365 days, patient will work with PharmD and providers to maintain optimal medication adherence . Current pharmacy: [facility] . Interventions o Comprehensive medication review performed. o Continue current medication management strategy . Patient self care activities - Over the next 365 days, patient will: o Take medications as prescribed o Report any questions or concerns to PharmD and/or provider(s) Initial goal documentation.      Hypertension   BP goal <140/90  BP Readings from Last 3 Encounters:  04/28/20 120/62  02/25/20 116/60  01/28/20 130/68   Patient checks BP at home infrequently Patient home BP readings are ranging: n/a Patient is currently controlled on the following medications:  . Amlodipine 10 mg once daily  . Lotensin 20-25 mg once daily   We discussed diet and exercise extensively. Reports healthy diet, does not exercise. Discussed routine walks 2-3 times weekly  to start. Biking not safe near house due to busy road.   Plan  Continue current medications.   Diabetes   A1c goal < 7%  Lab Results  Component Value Date/Time   HGBA1C 6.3 04/28/2020 04:12 PM   HGBA1C 5.9 (A) 01/28/2020 10:11 AM   HGBA1C 6.0 06/27/2019 08:37 AM    Checking BG: Rarely. Recent FBG readings n/a.  Previous medications: Patient is currently controlled on the following medications:  . No medications at this time   We discussed: diet and exercise extensively.  Plan  Continue current medications and control with diet and exercise.  Hyperlipidemia      Component Value Date/Time   CHOL 179 06/27/2019 0837   TRIG 70.0 06/27/2019 0837   HDL 47.60 06/27/2019 0837   LDLCALC 117 (H) 06/27/2019 0837   LDLDIRECT 127.3 02/11/2010 1135    Hepatic Function Latest Ref Rng & Units 04/28/2020 06/27/2019 12/27/2018  Total Protein 6.0 - 8.3 g/dL 7.2 7.0 7.0  Albumin 3.5 - 5.2 g/dL 4.4 4.3 4.1  AST 0 - 37 U/L 18 16 18   ALT 0 - 53 U/L 13 12  16  Alk Phosphatase 39 - 117 U/L 75 69 67  Total Bilirubin 0.2 - 1.2 mg/dL 0.7 0.9 0.7  Bilirubin, Direct 0.0 - 0.3 mg/dL - - -    The ASCVD Risk score (Oberon., et al., 2013) failed to calculate for the following reasons:   The 2013 ASCVD risk score is only valid for ages 6 to 51   LDL not at goal. Patient has failed these meds in past: n/a. Patient is currently taking: . Omega-3 fatty acids 1 capsule (1000 mg once daily)  We discussed:  diet and exercise extensively.  Plan  Continue current medications.  Vaccines   Immunization History  Administered Date(s) Administered  . Fluad Quad(high Dose 65+) 09/25/2019  . Influenza Split 11/18/2011, 11/01/2012  . Influenza Whole 09/01/2010  . Influenza, High Dose Seasonal PF 09/18/2014, 10/28/2015, 10/12/2016, 10/17/2017, 10/09/2018  . Influenza,inj,Quad PF,6+ Mos 09/11/2013  . PFIZER SARS-COV-2 Vaccination 12/22/2019, 01/12/2020  . Pneumococcal Conjugate-13 05/21/2015  .  Pneumococcal Polysaccharide-23 12/13/2004, 02/11/2010  . Td 12/14/2003  . Tdap 09/29/2017  . Tetanus 05/20/2014  . Zoster 03/04/2010   Reviewed and discussed patient's vaccination history.    Plan  Recommended patient receive covid booster, flu, shingrix vaccine.   Medication Management Coordination   Receives prescription medications from:  [place], [place] - [address] [place] [place] [address] Phone: [phone] Fax: [phone]  [facility] [place], [place] to [address] [place] [address] Phone: [phone] Fax: [phone]   Denies any issues with current medication management.   Plan  Continue current medication management strategy. ___________________________ SDOH (Social Determinants of Health) assessments performed: Yes.  Future Appointments  Date Time Provider [place]  09/29/2020  4:20 PM [name], MD [facility]  02/27/2021 10:30 AM Narda Amber K, DO [facility] None  08/17/2021  1:00 PM [facility] CCM PHARMACIST [facility]   Visit follow-up:  . [place] follow-up: 1 year month f/u telephone visit.  [name], Pharm.D., BCGP Clinical Pharmacist [facility] [phone]

## 2020-08-22 NOTE — Patient Instructions (Addendum)
Please review care plan below and call me at [phone] with any questions!  Thank you, Edyth Gunnels., Clinical Pharmacist  Goals Addressed            This Visit's Progress   . PharmD Care Plan       CARE PLAN ENTRY (see longitudinal plan of care for additional care plan information)  Current Barriers:  . Chronic Disease Management support, education, and care coordination needs related to Hypertension, Hyperlipidemia, and Diabetes   Hypertension BP Readings from Last 3 Encounters:  04/28/20 120/62  02/25/20 116/60  01/28/20 130/68   . Pharmacist Clinical Goal(s): o Over the next 365 days, patient will work with PharmD and providers to maintain BP goal <140/90 . Current regimen:  o Amlodipine 10 mg once daily o Lotensin 20-25 mg once daily  . Interventions: o Diet/exercise recommendations . Patient self care activities - Over the next 365 days, patient will: o Check BP at least once every 1-2 weeks, document, and provide at future appointments o Ensure daily salt intake < 2300 mg/day  Hyperlipidemia Lab Results  Component Value Date/Time   LDLCALC 117 (H) 06/27/2019 08:37 AM   LDLDIRECT 127.3 02/11/2010 11:35 AM   . Pharmacist Clinical Goal(s): o Over the next 365 days, patient will work with PharmD and providers to achieve LDL goal < 100 . Current regimen:  o N/a  . Interventions: o Diet/exercise recommendations  . Patient self care activities - Over the next 365 days, patient will:  Diabetes Lab Results  Component Value Date/Time   HGBA1C 6.3 04/28/2020 04:12 PM   HGBA1C 5.9 (A) 01/28/2020 10:11 AM   HGBA1C 6.0 06/27/2019 08:37 AM   . Pharmacist Clinical Goal(s): o Over the next 365 days, patient will work with PharmD and providers to maintain A1c goal <6.5% . Current regimen:  o N/a . Interventions: o Diet/exercise recommendations . Patient self care activities - Over the next 365 days, patient will: o Check blood sugar as directed, document, and provide  at future appointments o Contact provider with any episodes of hypoglycemia  Medication management . Pharmacist Clinical Goal(s): o Over the next 365 days, patient will work with PharmD and providers to maintain optimal medication adherence . Current pharmacy: [facility] . Interventions o Comprehensive medication review performed. o Continue current medication management strategy . Patient self care activities - Over the next 365 days, patient will: o Take medications as prescribed o Report any questions or concerns to PharmD and/or provider(s) Initial goal documentation.      Mitchell Walker was given information about Chronic Care Management services today including:  1. CCM service includes personalized support from designated clinical staff supervised by his physician, including individualized plan of care and coordination with other care providers 2. 24/7 contact phone numbers for assistance for urgent and routine care needs. 3. Standard insurance, coinsurance, copays and deductibles apply for chronic care management only during months in which we provide at least 20 minutes of these services. Most insurances cover these services at 100%, however patients may be responsible for any copay, coinsurance and/or deductible if applicable. This service may help you avoid the need for more expensive face-to-face services. 4. Only one practitioner may furnish and bill the service in a calendar month. 5. The patient may stop CCM services at any time (effective at the end of the month) by phone call to the office staff.  Patient agreed to services and verbal consent obtained.   The patient verbalized understanding of instructions provided today  and agreed to receive a mailed copy of patient instruction and/or educational materials. Telephone follow up appointment with pharmacy team member scheduled for: See next appointment with "Care Management Staff" under "What's Next" below.   [name],  Pharm.D., BCGP Clinical Pharmacist [facility] [phone]  Diabetes Mellitus and Nutrition, Adult When you have diabetes (diabetes mellitus), it is very important to have healthy eating habits because your blood sugar (glucose) levels are greatly affected by what you eat and drink. Eating healthy foods in the appropriate amounts, at about the same times every day, can help you:  Control your blood glucose.  Lower your risk of heart disease.  Improve your blood pressure.  Reach or maintain a healthy weight. Every person with diabetes is different, and each person has different needs for a meal plan. Your health care provider may recommend that you work with a diet and nutrition specialist (dietitian) to make a meal plan that is best for you. Your meal plan may vary depending on factors such as:  The calories you need.  The medicines you take.  Your weight.  Your blood glucose, blood pressure, and cholesterol levels.  Your activity level.  Other health conditions you have, such as heart or kidney disease. How do carbohydrates affect me? Carbohydrates, also called carbs, affect your blood glucose level more than any other type of food. Eating carbs naturally raises the amount of glucose in your blood. Carb counting is a method for keeping track of how many carbs you eat. Counting carbs is important to keep your blood glucose at a healthy level, especially if you use insulin or take certain oral diabetes medicines. It is important to know how many carbs you can safely have in each meal. This is different for every person. Your dietitian can help you calculate how many carbs you should have at each meal and for each snack. Foods that contain carbs include:  Bread, cereal, rice, pasta, and crackers.  Potatoes and corn.  Peas, beans, and lentils.  Milk and yogurt.  Fruit and juice.  Desserts, such as cakes, cookies, ice cream, and candy. How does alcohol affect  me? Alcohol can cause a sudden decrease in blood glucose (hypoglycemia), especially if you use insulin or take certain oral diabetes medicines. Hypoglycemia can be a life-threatening condition. Symptoms of hypoglycemia (sleepiness, dizziness, and confusion) are similar to symptoms of having too much alcohol. If your health care provider says that alcohol is safe for you, follow these guidelines:  Limit alcohol intake to no more than 1 drink per day for nonpregnant women and 2 drinks per day for men. One drink equals 12 oz of beer, 5 oz of wine, or 1 oz of hard liquor.  Do not drink on an empty stomach.  Keep yourself hydrated with water, diet soda, or unsweetened iced tea.  Keep in mind that regular soda, juice, and other mixers may contain a lot of sugar and must be counted as carbs. What are tips for following this plan?  Reading food labels  Start by checking the serving size on the "Nutrition Facts" label of packaged foods and drinks. The amount of calories, carbs, fats, and other nutrients listed on the label is based on one serving of the item. Many items contain more than one serving per package.  Check the total grams (g) of carbs in one serving. You can calculate the number of servings of carbs in one serving by dividing the total carbs by 15. For example,  if a food has 30 g of total carbs, it would be equal to 2 servings of carbs.  Check the number of grams (g) of saturated and trans fats in one serving. Choose foods that have low or no amount of these fats.  Check the number of milligrams (mg) of salt (sodium) in one serving. Most people should limit total sodium intake to less than 2,300 mg per day.  Always check the nutrition information of foods labeled as "low-fat" or "nonfat". These foods may be higher in added sugar or refined carbs and should be avoided.  Talk to your dietitian to identify your daily goals for nutrients listed on the label. Shopping  Avoid buying  canned, premade, or processed foods. These foods tend to be high in fat, sodium, and added sugar.  Shop around the outside edge of the grocery store. This includes fresh fruits and vegetables, bulk grains, fresh meats, and fresh dairy. Cooking  Use low-heat cooking methods, such as baking, instead of high-heat cooking methods like deep frying.  Cook using healthy oils, such as olive, canola, or sunflower oil.  Avoid cooking with butter, cream, or high-fat meats. Meal planning  Eat meals and snacks regularly, preferably at the same times every day. Avoid going long periods of time without eating.  Eat foods high in fiber, such as fresh fruits, vegetables, beans, and whole grains. Talk to your dietitian about how many servings of carbs you can eat at each meal.  Eat 4-6 ounces (oz) of lean protein each day, such as lean meat, chicken, fish, eggs, or tofu. One oz of lean protein is equal to: ? 1 oz of meat, chicken, or fish. ? 1 egg. ?  cup of tofu.  Eat some foods each day that contain healthy fats, such as avocado, nuts, seeds, and fish. Lifestyle  Check your blood glucose regularly.  Exercise regularly as told by your health care provider. This may include: ? 150 minutes of moderate-intensity or vigorous-intensity exercise each week. This could be brisk walking, biking, or water aerobics. ? Stretching and doing strength exercises, such as yoga or weightlifting, at least 2 times a week.  Take medicines as told by your health care provider.  Do not use any products that contain nicotine or tobacco, such as cigarettes and e-cigarettes. If you need help quitting, ask your health care provider.  Work with a Social worker or diabetes educator to identify strategies to manage stress and any emotional and social challenges. Questions to ask a health care provider  Do I need to meet with a diabetes educator?  Do I need to meet with a dietitian?  What number can I call if I have  questions?  When are the best times to check my blood glucose? Where to find more information:  American Diabetes Association: diabetes.org  Academy of Nutrition and Dietetics: www.eatright.[employer] of Diabetes and Digestive and Kidney Diseases (NIH): [employer] Summary  A healthy meal plan will help you control your blood glucose and maintain a healthy lifestyle.  Working with a diet and nutrition specialist (dietitian) can help you make a meal plan that is best for you.  Keep in mind that carbohydrates (carbs) and alcohol have immediate effects on your blood glucose levels. It is important to count carbs and to use alcohol carefully. This information is not intended to replace advice given to you by your health care provider. Make sure you discuss any questions you have with your health care provider. Document Revised: 11/11/2017  Document Reviewed: 01/03/2017 Elsevier Patient Education  [employer].

## 2020-08-22 NOTE — Progress Notes (Signed)
I have collaborated with the care management provider regarding care management and care coordination activities outlined in this encounter and have reviewed this encounter including documentation in the note and care plan. I am certifying that I agree with the content of this note and encounter as supervising physician.  Garret Reddish, MD

## 2020-09-09 DIAGNOSIS — Z23 Encounter for immunization: Secondary | ICD-10-CM | POA: Diagnosis not present

## 2020-09-25 NOTE — Patient Instructions (Addendum)
Health Maintenance Due  Topic Date Due  . INFLUENZA VACCINE In office flu shot today. 07/13/2020  . COLONOSCOPY please call Dr. Fuller Plan to discuss 08/03/2020   Please go to [facility] [place]  central X-ray (updated 02/07/2020) - located [address] across the street from [place] - in the basement - Hours: 8:30-5:00 PM M-F (with lunch from 12:30- 1 PM). You do NOT need an appointment.  - Please ensure you are covid symptom free before going in(No fever, chills, cough, congestion, runny nose, shortness of breath, fatigue, body aches, sore throat, headache, nausea, vomiting, diarrhea, or new loss of taste or smell. No known contacts with covid 19 or someone being tested for covid 19)

## 2020-09-25 NOTE — Progress Notes (Signed)
Phone [phone] In person visit   Subjective:   Mitchell Walker is a 86 y.o. year old very pleasant male patient who presents for/with See problem oriented charting Chief Complaint  Patient presents with  . Hypertension  . Diabetes   This visit occurred during the SARS-CoV-2 public health emergency.  Safety protocols were in place, including screening questions prior to the visit, additional usage of staff PPE, and extensive cleaning of exam room while observing appropriate contact time as indicated for disinfecting solutions.   Past Medical History-  Patient Active Problem List   Diagnosis Date Noted  . Advanced directives, counseling/discussion 05/20/2017    Priority: High  . Diabetes mellitus ([place]) 05/20/2017    Priority: High  . B12 deficiency 05/02/2017    Priority: High  . MCI (mild cognitive impairment) 10/22/2016    Priority: High  . Glaucoma 12/27/2017    Priority: Medium  . Hyperlipidemia associated with type 2 diabetes mellitus ([place]) 02/03/2009    Priority: Medium  . Hypertension associated with diabetes ([place]) 07/17/2007    Priority: Medium  . Former smoker 05/21/2015    Priority: Low  . Adenomatous colon polyp 05/21/2015    Priority: Low  . Chronic frontal sinusitis 12/30/2010    Priority: Low  . ACNE ROSACEA 04/15/2010    Priority: Low  . Actinic keratosis 02/03/2009    Priority: Low  . Basal cell carcinoma of skin 02/29/2008    Priority: Low  . ERECTILE DYSFUNCTION 12/11/2007    Priority: Low  . Osteoarthritis 07/17/2007    Priority: Low    Medications- reviewed and updated Current Outpatient Medications  Medication Sig Dispense Refill  . amLODipine (NORVASC) 10 MG tablet Take 1 tablet by mouth once daily 90 tablet 0  . benazepril-hydrochlorthiazide (LOTENSIN HCT) 20-25 MG tablet Take 1 tablet by mouth once daily 90 tablet 0  . Cholecalciferol (VITAMIN D-3) 125 MCG (5000 UT) TABS Take 1 tablet by mouth daily.    . Omega-3 Fatty Acids (FISH OIL)  1000 MG CAPS Take 1 capsule by mouth daily.    . vitamin B-12 (CYANOCOBALAMIN) 1000 MCG tablet Take 1,000 mcg by mouth daily.     No current facility-administered medications for this visit.     Objective:  BP 122/70   Pulse 61   Temp 98.3 F (36.8 C) (Temporal)   Resp 18   Ht 5\' 7"  (1.702 m)   Wt 189 lb (85.7 kg)   SpO2 96%   BMI 29.60 kg/m  Gen: NAD, resting comfortably CV: RRR no murmurs rubs or gallops Lungs: CTAB no crackles, wheeze, rhonchi.  Ext: no edema Skin: warm, dry     Assessment and Plan   % Mild cognitive impairment-follows with Dr. Posey Pronto . Wife notes worsening symptoms.   #Glaucoma- no issues since laser treatment for narrow angle glaucoma  #several months of cough per wife particularly worse at night. He is an ex smoker quit 1975 but 25 pack years. Could be allergies or drainage. Will get x-ray with prior smoking  # plans to schedule follow up with Dr. Fuller Plan to discuss possible 2 year repeat  #hypertension S: compliant with Amlodipine 10 mg and Benazepril-HCT 20-25 mg daily.  BP Readings from Last 3 Encounters:  09/29/20 122/70  04/28/20 120/62  02/25/20 116/60  A/P: Stable. Continue currenjt medications.   # Diabetes S: diet controlled. No. Rx.  Lab Results  Component Value Date   HGBA1C 6.3 04/28/2020  Exercise and diet- encouraged to start exercise  A/P:  well controlled on POC a1c at 6.0 (likely around 6.5 on phlebtomy)- will continue without meds- encouraged exercise and continue to monitor diet. No changes. today  #hyperlipidemia S: patient takes Omega-3. Benefit of primary prevention at his age and with memory loss is unclear- will hold off on statin  A/P: we will plan to update cholesterol next visit   # B12 Deficiency S:B12 last checked 06/26/18 and was 785 pg/mL.  He remains on daily supplements 1000 mcg Lab Results  Component Value Date   [id number] 06/27/2019  A/P: controlled last visit as well- likely repeat next  bloodwork  Recommended follow up: Return in about 6 months (around 03/30/2021) for follow up- or sooner if needed. Future Appointments  Date Time Provider [place]  02/27/2021 10:30 AM Narda Amber K, DO [facility] None  08/17/2021  1:00 PM [facility] CCM PHARMACIST [facility]    Lab/Order associations:   ICD-10-CM   1. Hypertension associated with diabetes ([place])  E11.59    I15.2   2. Type 2 diabetes mellitus without complication, without long-term current use of insulin (H[facility])  E11.9   3. Hyperlipidemia associated with type 2 diabetes mellitus ([place])  E11.69    E78.5   4. B12 deficiency  E53.8   5. Chronic cough  R05.3 DG Chest 2 View    CANCELED: DG Chest 2 View  6. Need for immunization against influenza  Z23 Flu Vaccine QUAD High Dose(Fluad)   Return precautions advised.  Garret Reddish, MD

## 2020-09-29 ENCOUNTER — Encounter: Payer: Self-pay | Admitting: Family Medicine

## 2020-09-29 ENCOUNTER — Ambulatory Visit (INDEPENDENT_AMBULATORY_CARE_PROVIDER_SITE_OTHER): Payer: Medicare Other | Admitting: Family Medicine

## 2020-09-29 ENCOUNTER — Other Ambulatory Visit: Payer: Self-pay

## 2020-09-29 VITALS — BP 122/70 | HR 61 | Temp 98.3°F | Resp 18 | Ht 67.0 in | Wt 189.0 lb

## 2020-09-29 DIAGNOSIS — E1159 Type 2 diabetes mellitus with other circulatory complications: Secondary | ICD-10-CM | POA: Diagnosis not present

## 2020-09-29 DIAGNOSIS — E538 Deficiency of other specified B group vitamins: Secondary | ICD-10-CM | POA: Diagnosis not present

## 2020-09-29 DIAGNOSIS — E1169 Type 2 diabetes mellitus with other specified complication: Secondary | ICD-10-CM

## 2020-09-29 DIAGNOSIS — R053 Chronic cough: Secondary | ICD-10-CM

## 2020-09-29 DIAGNOSIS — Z23 Encounter for immunization: Secondary | ICD-10-CM

## 2020-09-29 DIAGNOSIS — E119 Type 2 diabetes mellitus without complications: Secondary | ICD-10-CM | POA: Diagnosis not present

## 2020-09-29 DIAGNOSIS — E785 Hyperlipidemia, unspecified: Secondary | ICD-10-CM

## 2020-09-29 DIAGNOSIS — I152 Hypertension secondary to endocrine disorders: Secondary | ICD-10-CM | POA: Diagnosis not present

## 2020-09-30 ENCOUNTER — Ambulatory Visit (INDEPENDENT_AMBULATORY_CARE_PROVIDER_SITE_OTHER)
Admission: RE | Admit: 2020-09-30 | Discharge: 2020-09-30 | Disposition: A | Discharge status: Home / Self Care | Payer: Medicare Other | Source: Ambulatory Visit | Attending: Family Medicine | Admitting: Family Medicine

## 2020-09-30 DIAGNOSIS — R053 Chronic cough: Secondary | ICD-10-CM | POA: Diagnosis not present

## 2020-09-30 DIAGNOSIS — J439 Emphysema, unspecified: Secondary | ICD-10-CM | POA: Diagnosis not present

## 2020-09-30 DIAGNOSIS — R911 Solitary pulmonary nodule: Secondary | ICD-10-CM | POA: Diagnosis not present

## 2020-09-30 DIAGNOSIS — R059 Cough, unspecified: Secondary | ICD-10-CM | POA: Diagnosis not present

## 2020-09-30 DIAGNOSIS — I10 Essential (primary) hypertension: Secondary | ICD-10-CM | POA: Diagnosis not present

## 2020-10-01 ENCOUNTER — Other Ambulatory Visit: Payer: Self-pay | Admitting: Family Medicine

## 2020-10-01 ENCOUNTER — Encounter: Payer: Self-pay | Admitting: Family Medicine

## 2020-10-01 DIAGNOSIS — J439 Emphysema, unspecified: Secondary | ICD-10-CM | POA: Insufficient documentation

## 2020-10-01 MED ORDER — ALBUTEROL SULFATE HFA 108 (90 BASE) MCG/ACT IN AERS: 2.0000 | INHALATION_SPRAY | Freq: Four times a day (QID) | RESPIRATORY_TRACT | 2 refills | Status: AC | PRN

## 2020-10-14 ENCOUNTER — Telehealth: Payer: Self-pay

## 2020-10-14 ENCOUNTER — Other Ambulatory Visit: Payer: Self-pay

## 2020-10-14 DIAGNOSIS — R911 Solitary pulmonary nodule: Secondary | ICD-10-CM

## 2020-10-14 NOTE — Telephone Encounter (Signed)
See below, I dont see anything in your note from 10/18 regarding a CT scan.

## 2020-10-14 NOTE — Progress Notes (Signed)
CT ordered. 

## 2020-10-14 NOTE — Telephone Encounter (Signed)
May order CT chest without contrast under pulmonary nodule-noted 4 mm on chest x-ray

## 2020-10-14 NOTE — Telephone Encounter (Signed)
CT ordered, called and lm on pt tcb to make them aware this has been ordered.

## 2020-10-14 NOTE — Telephone Encounter (Signed)
Pt.'s wife is wanting the CT scan ordered for pt that was discussed in the previous appt.

## 2020-10-16 DIAGNOSIS — D485 Neoplasm of uncertain behavior of skin: Secondary | ICD-10-CM | POA: Diagnosis not present

## 2020-10-16 DIAGNOSIS — D225 Melanocytic nevi of trunk: Secondary | ICD-10-CM | POA: Diagnosis not present

## 2020-10-16 DIAGNOSIS — L570 Actinic keratosis: Secondary | ICD-10-CM | POA: Diagnosis not present

## 2020-10-16 DIAGNOSIS — Z85828 Personal history of other malignant neoplasm of skin: Secondary | ICD-10-CM | POA: Diagnosis not present

## 2020-10-16 DIAGNOSIS — L814 Other melanin hyperpigmentation: Secondary | ICD-10-CM | POA: Diagnosis not present

## 2020-10-16 DIAGNOSIS — D224 Melanocytic nevi of scalp and neck: Secondary | ICD-10-CM | POA: Diagnosis not present

## 2020-10-16 DIAGNOSIS — L821 Other seborrheic keratosis: Secondary | ICD-10-CM | POA: Diagnosis not present

## 2020-10-17 ENCOUNTER — Ambulatory Visit
Admission: RE | Admit: 2020-10-17 | Discharge: 2020-10-17 | Disposition: A | Discharge status: Home / Self Care | Payer: Medicare Other | Source: Ambulatory Visit | Attending: Family Medicine | Admitting: Family Medicine

## 2020-10-17 DIAGNOSIS — I2510 Atherosclerotic heart disease of native coronary artery without angina pectoris: Secondary | ICD-10-CM | POA: Diagnosis not present

## 2020-10-17 DIAGNOSIS — R911 Solitary pulmonary nodule: Secondary | ICD-10-CM | POA: Diagnosis not present

## 2020-10-17 DIAGNOSIS — I700 Atherosclerosis of aorta: Secondary | ICD-10-CM | POA: Diagnosis not present

## 2020-10-18 ENCOUNTER — Other Ambulatory Visit: Payer: Self-pay | Admitting: Family Medicine

## 2020-10-20 ENCOUNTER — Telehealth: Payer: Self-pay

## 2020-10-20 ENCOUNTER — Encounter: Payer: Self-pay | Admitting: Family Medicine

## 2020-10-20 DIAGNOSIS — R911 Solitary pulmonary nodule: Secondary | ICD-10-CM

## 2020-10-20 DIAGNOSIS — I700 Atherosclerosis of aorta: Secondary | ICD-10-CM | POA: Insufficient documentation

## 2020-10-20 NOTE — Telephone Encounter (Signed)
Mitchell Walker with [facility] called and stated that there is a 42mm nodule in the left lower lobe. Recommending a non-contrast chest ct in 6-12 months. There were also gallstones noted in the ct as well.

## 2020-10-20 NOTE — Telephone Encounter (Signed)
See result note I wrote on this

## 2020-11-13 ENCOUNTER — Ambulatory Visit (INDEPENDENT_AMBULATORY_CARE_PROVIDER_SITE_OTHER): Payer: Medicare Other | Admitting: Gastroenterology

## 2020-11-13 ENCOUNTER — Encounter: Payer: Self-pay | Admitting: Gastroenterology

## 2020-11-13 VITALS — BP 130/60 | HR 72 | Ht 67.0 in | Wt 187.0 lb

## 2020-11-13 DIAGNOSIS — Z86010 Personal history of colonic polyps: Secondary | ICD-10-CM

## 2020-11-13 NOTE — Progress Notes (Signed)
    History of Present Illness: This is an 86 year old male with a history of several TAs and SSPs on colonoscopy in August 2019.  He returns to discuss surveillance colonoscopy.  He is accompanied by his wife.  He has no ongoing digestive complaints.  He is in very good health. Denies weight loss, abdominal pain, constipation, diarrhea, change in stool caliber, melena, hematochezia, nausea, vomiting, dysphagia, reflux symptoms, chest pain.  Current Medications, Allergies, Past Medical History, Past Surgical History, Family History and Social History were reviewed in [employer] record.   Physical Exam: General: Well developed, well nourished, no acute distress Head: Normocephalic and atraumatic Eyes:  sclerae anicteric, EOMI Ears: Normal auditory acuity Mouth: Not examined, mask on during Covid-19 pandemic Lungs: Clear throughout to auscultation Heart: Regular rate and rhythm; no murmurs, rubs or bruits Abdomen: Soft, non tender and non distended. No masses, hepatosplenomegaly or hernias noted. Normal Bowel sounds Rectal: Not done Musculoskeletal: Symmetrical with no gross deformities  Pulses:  Normal pulses noted Extremities: No clubbing, cyanosis, edema or deformities noted Neurological: Alert oriented x 4, grossly nonfocal Psychological:  Alert and cooperative. Normal mood and affect   Assessment and Recommendations:  1.  Personal history of multiple tubular adenomas and sessile serrated polyps on his last colonoscopy in July 2019.  Given multiple precancerous polyps and his overall good health I recommended proceeding with one more colonoscopy.  Also it is reasonable to discontinue future colonoscopies due to his age. He is reluctant to proceed at this time with the upcoming holidays and a local move planned early in 2022 however he will further consider and contact us in 2022 if he would like to proceed.

## 2020-11-13 NOTE — Patient Instructions (Signed)
It has been recommended to you by your physician that you have a(n) Colonoscopy completed. Per your request, we did not schedule the procedure(s) today. Please contact our office at [phone] should you decide to have the procedure completed. You will be scheduled for a pre-visit and procedure at that time.   Thank you for choosing me and [facility].  Pricilla Riffle. Dagoberto Ligas., MD., [facility]

## 2020-11-14 ENCOUNTER — Telehealth: Payer: Self-pay

## 2020-11-14 ENCOUNTER — Other Ambulatory Visit: Payer: Self-pay

## 2020-11-14 ENCOUNTER — Other Ambulatory Visit (INDEPENDENT_AMBULATORY_CARE_PROVIDER_SITE_OTHER): Payer: Medicare Other

## 2020-11-14 DIAGNOSIS — R319 Hematuria, unspecified: Secondary | ICD-10-CM | POA: Diagnosis not present

## 2020-11-14 DIAGNOSIS — R310 Gross hematuria: Secondary | ICD-10-CM

## 2020-11-14 LAB — POC URINALSYSI DIPSTICK (AUTOMATED)
Bilirubin, UA: NEGATIVE
Glucose, UA: NEGATIVE
Ketones, UA: NEGATIVE
Leukocytes, UA: NEGATIVE
Nitrite, UA: NEGATIVE
Protein, UA: NEGATIVE
Spec Grav, UA: 1.01 (ref 1.010–1.025)
Urobilinogen, UA: 0.2 E.U./dL
pH, UA: 6 (ref 5.0–8.0)

## 2020-11-14 NOTE — Telephone Encounter (Signed)
I am fine with ordering a urinalysis and urine culture under hematuria.  With that being said with gross hematuria which is blood that is visible in the urine-I always recommend urology referral under gross hematuria-please place an order for this

## 2020-11-14 NOTE — Telephone Encounter (Signed)
See below

## 2020-11-14 NOTE — Telephone Encounter (Signed)
Pt's wife said he has some blood in his urine. She states she has a urine specimen container that we gave to her previously. She is requesting that when pt urinates next, they fill it and bring the urine in to be tested. Can orders be placed or do they need OV? ( we are booked today, as well as the other lebauers)

## 2020-11-14 NOTE — Telephone Encounter (Signed)
Orders have been placed, ok to schedule lab visit.

## 2020-11-15 LAB — URINE CULTURE
MICRO NUMBER:: 11274719
Result:: NO GROWTH
SPECIMEN QUALITY:: ADEQUATE

## 2020-12-30 ENCOUNTER — Telehealth: Payer: Self-pay

## 2020-12-30 NOTE — Telephone Encounter (Signed)
Noted  

## 2020-12-30 NOTE — Telephone Encounter (Signed)
..  Type of form received:  Health Info form from [facility]   Additional comments:   Received by:  Gustave Lindeman   Form should be Faxed to:  [phone]  Form should be mailed to:    Is patient requesting call for pickup:   Form placed:  Attach charge sheet.  Provider will determine charge  Individual made aware of 3-5 business day turn around (Y/N)?   Please follow up with patient once faxed.

## 2021-01-17 ENCOUNTER — Other Ambulatory Visit: Payer: Self-pay | Admitting: Family Medicine

## 2021-01-20 ENCOUNTER — Telehealth: Payer: Self-pay

## 2021-01-20 NOTE — Chronic Care Management (AMB) (Signed)
Chronic Care Management Pharmacy Assistant   Name: Mitchell Walker  MRN: [id number] DOB: Feb 14, 1934  Reason for Encounter: Disease State/ Hypertension Adherence Call  Patient Questions:  1.  Have you seen any other providers since your last visit? Yes, 09/29/2020 [name], MD, 10/16/2020 [facility] Dermatology, [name], MD, 11/13/2020 [facility] Gastroenterology, [name], MD, 11/14/2020 [name], MD - hematuria, 11/27/2020 [facility] COVID-19 testing [name], NP - negative COVID-19 results  2.  Any changes in your medicines or health? No, patient's wife states the patient has not had any changes in his medicines or health.    PCP : [name], MD  Allergies:   Allergies  Allergen Reactions  . Celecoxib     REACTION: Upset GI  . Sulfonamide Derivatives     REACTION: rash    Medications: Outpatient Encounter Medications as of 01/20/2021  Medication Sig  . albuterol (PROAIR HFA) 108 (90 Base) MCG/ACT inhaler Inhale 2 puffs into the lungs every 6 (six) hours as needed for wheezing or shortness of breath (or cough).  Marland Kitchen amLODipine (NORVASC) 10 MG tablet Take 1 tablet by mouth once daily  . benazepril-hydrochlorthiazide (LOTENSIN HCT) 20-25 MG tablet Take 1 tablet by mouth once daily  . Cholecalciferol (VITAMIN D-3) 125 MCG (5000 UT) TABS Take 1 tablet by mouth daily.  . Omega-3 Fatty Acids (FISH OIL) 1000 MG CAPS Take 1 capsule by mouth daily.  . vitamin B-12 (CYANOCOBALAMIN) 1000 MCG tablet Take 1,000 mcg by mouth daily.   No facility-administered encounter medications on file as of 01/20/2021.    Current Diagnosis: Patient Active Problem List   Diagnosis Date Noted  . Aortic atherosclerosis ([place]) 10/20/2020  . Emphysema lung ([place]) 10/01/2020  . Glaucoma 12/27/2017  . Advanced directives, counseling/discussion 05/20/2017  . Diabetes mellitus ([place]) 05/20/2017  . B12 deficiency 05/02/2017  . MCI (mild cognitive impairment) 10/22/2016  . Former smoker  05/21/2015  . Adenomatous colon polyp 05/21/2015  . Chronic frontal sinusitis 12/30/2010  . ACNE ROSACEA 04/15/2010  . Hyperlipidemia associated with type 2 diabetes mellitus ([place]) 02/03/2009  . Actinic keratosis 02/03/2009  . Basal cell carcinoma of skin 02/29/2008  . ERECTILE DYSFUNCTION 12/11/2007  . Hypertension associated with diabetes ([place]) 07/17/2007  . Osteoarthritis 07/17/2007    Reviewed chart prior to disease state call. Spoke with patient regarding BP  Recent Office Vitals: BP Readings from Last 3 Encounters:  11/13/20 130/60  09/29/20 122/70  04/28/20 120/62   Pulse Readings from Last 3 Encounters:  11/13/20 72  09/29/20 61  04/28/20 91    Wt Readings from Last 3 Encounters:  11/13/20 187 lb (84.8 kg)  09/29/20 189 lb (85.7 kg)  04/28/20 182 lb 9.6 oz (82.8 kg)     Kidney Function Lab Results  Component Value Date/Time   CREATININE 0.95 04/28/2020 04:12 PM   CREATININE 0.79 06/27/2019 08:37 AM   CREATININE 0.81 10/22/2016 04:04 PM   GFR 75.57 04/28/2020 04:12 PM   GFRNONAA 80.36 10/19/2010 11:18 AM   GFRAA 107 01/27/2009 08:15 AM    BMP Latest Ref Rng & Units 04/28/2020 06/27/2019 12/27/2018  Glucose 70 - 99 mg/dL 155(H) 105(H) 118(H)  BUN 6 - 23 mg/dL 11 11 9   Creatinine 0.40 - 1.50 mg/dL 0.95 0.79 0.83  Sodium 135 - 145 mEq/L 132(L) 131(L) 134(L)  Potassium 3.5 - 5.1 mEq/L 3.5 3.7 3.8  Chloride 96 - 112 mEq/L 96 97 98  CO2 19 - 32 mEq/L 30 27  30  Calcium 8.4 - 10.5 mg/dL 8.9 8.7 8.9    . Current antihypertensive regimen:  o Amlodipine 10 mg tablet daily o Benazepril-hydrochlorothiazide 20-25 mg tablet daily  . How often are you checking your Blood Pressure? Patient's wife states the patient has not been checking his blood pressures at home as they have been busy moving. She states they are moving to a Retirement community called [facility] in [place]. She states they are both excited about the upcoming move and have not checked his blood  pressure recently. She states it hasn't been checked since his last office visit with Dr. Yong Channel.  . Current home BP readings: n/a  . What recent interventions/DTPs have been made by any provider to improve Blood Pressure control since last CPP Visit: Patient's wife states the patient takes his medications as prescribed.  . Any recent hospitalizations or ED visits since last visit with CPP? No , patient has not had any recent hospitalizations or ED visits since his last visit with [name], CPP.  Marland Kitchen What diet changes have been made to improve Blood Pressure Control?  o Patient's wife states the patient does not follow any specific diet. She states the patient does avoid using any extra salt on his foot. She also states they each have a bowl of ice cream each night as their treat.  . What exercise is being done to improve your Blood Pressure Control?  o Patient's wife states the patient does not currently exercise. She states they are moving to an apartment at [place] soon. She states he will be able to exercise when they move there. She states the patient has not been exercising as there's no place to walk where they live and they try not to go out or go anywhere due to [place].  Adherence Review: Is the patient currently on ACE/ARB medication? Yes Does the patient have >5 day gap between last estimated fill dates? No  Future Appointments  Date Time Provider [place]  02/27/2021 10:30 AM Narda Amber K, DO [facility] None  03/31/2021  9:40 AM [name], MD [facility]  08/17/2021  1:00 PM [facility] CCM PHARMACIST [facility]    April D Calhoun, [place] Pharmacist Assistant [phone]   Follow-Up:  Pharmacist Review

## 2021-01-26 DIAGNOSIS — H0100A Unspecified blepharitis right eye, upper and lower eyelids: Secondary | ICD-10-CM | POA: Diagnosis not present

## 2021-01-26 DIAGNOSIS — E119 Type 2 diabetes mellitus without complications: Secondary | ICD-10-CM | POA: Diagnosis not present

## 2021-01-26 DIAGNOSIS — H52203 Unspecified astigmatism, bilateral: Secondary | ICD-10-CM | POA: Diagnosis not present

## 2021-01-26 DIAGNOSIS — H0100B Unspecified blepharitis left eye, upper and lower eyelids: Secondary | ICD-10-CM | POA: Diagnosis not present

## 2021-01-26 LAB — HM DIABETES EYE EXAM

## 2021-02-06 ENCOUNTER — Telehealth: Payer: Self-pay

## 2021-02-06 NOTE — Chronic Care Management (AMB) (Signed)
    Chronic Care Management Pharmacy Assistant   Name: Mitchell Walker  MRN: [id number] DOB: 06-22-1934  Reason for Encounter: Chart Review   PCP : [name], MD  Allergies:   Allergies  Allergen Reactions  . Celecoxib     REACTION: Upset GI  . Sulfonamide Derivatives     REACTION: rash    Medications: Outpatient Encounter Medications as of 02/06/2021  Medication Sig  . albuterol (PROAIR HFA) 108 (90 Base) MCG/ACT inhaler Inhale 2 puffs into the lungs every 6 (six) hours as needed for wheezing or shortness of breath (or cough).  Marland Kitchen amLODipine (NORVASC) 10 MG tablet Take 1 tablet by mouth once daily  . benazepril-hydrochlorthiazide (LOTENSIN HCT) 20-25 MG tablet Take 1 tablet by mouth once daily  . Cholecalciferol (VITAMIN D-3) 125 MCG (5000 UT) TABS Take 1 tablet by mouth daily.  . Omega-3 Fatty Acids (FISH OIL) 1000 MG CAPS Take 1 capsule by mouth daily.  . vitamin B-12 (CYANOCOBALAMIN) 1000 MCG tablet Take 1,000 mcg by mouth daily.   No facility-administered encounter medications on file as of 02/06/2021.    Current Diagnosis: Patient Active Problem List   Diagnosis Date Noted  . Aortic atherosclerosis ([place]) 10/20/2020  . Emphysema lung ([place]) 10/01/2020  . Glaucoma 12/27/2017  . Advanced directives, counseling/discussion 05/20/2017  . Diabetes mellitus ([place]) 05/20/2017  . B12 deficiency 05/02/2017  . MCI (mild cognitive impairment) 10/22/2016  . Former smoker 05/21/2015  . Adenomatous colon polyp 05/21/2015  . Chronic frontal sinusitis 12/30/2010  . ACNE ROSACEA 04/15/2010  . Hyperlipidemia associated with type 2 diabetes mellitus ([place]) 02/03/2009  . Actinic keratosis 02/03/2009  . Basal cell carcinoma of skin 02/29/2008  . ERECTILE DYSFUNCTION 12/11/2007  . Hypertension associated with diabetes ([place]) 07/17/2007  . Osteoarthritis 07/17/2007    Reviewed chart for medication changes. No OVs, Consults, or hospital visits since last care coordination  call/Pharmacist visit. No medication changes indicated.  Future Appointments  Date Time Provider [place]  02/27/2021 10:30 AM Narda Amber K, DO [facility] None  03/31/2021  9:40 AM [name], MD [facility]  08/17/2021  1:00 PM [facility] CCM PHARMACIST [facility]    April D Calhoun, [place] Pharmacist Assistant [phone]   Follow-Up:  Pharmacist Review

## 2021-02-07 IMAGING — CT CT CHEST W/O CM
2 of 4 series · 11 of 36 positions shown, 13 images · non-contrast
Comparison: September 30, 2020.

CLINICAL DATA: Left lung nodule.

EXAM:
CT CHEST WITHOUT CONTRAST
TECHNIQUE: Multidetector CT imaging of the chest was performed following the
standard protocol without IV contrast.

[Series 2: chest 2.00 br40 s3 · axial · 0.61mm/px · z∈[+1337,+1615]mm · 8 of 163 slices shown, 10 images (1 of 2)]
[im 12/163  mediastinal]
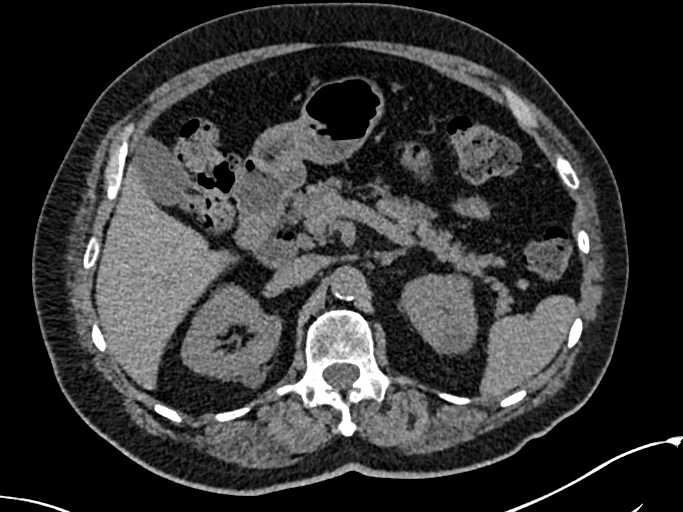
[im 12/163  lung]
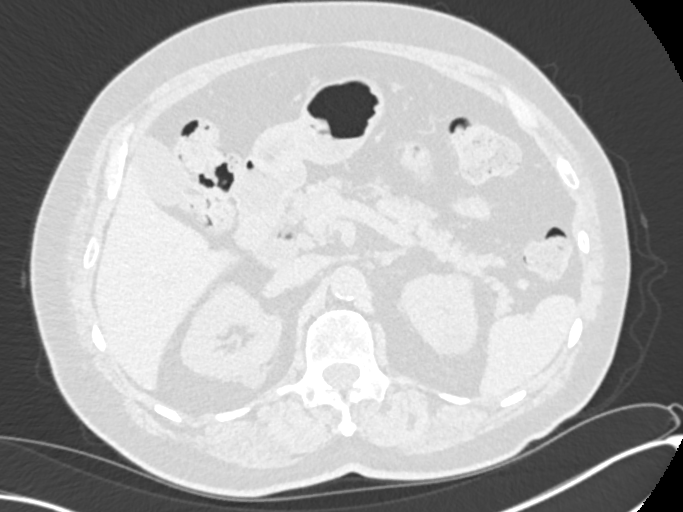
[im 35/163  lung]
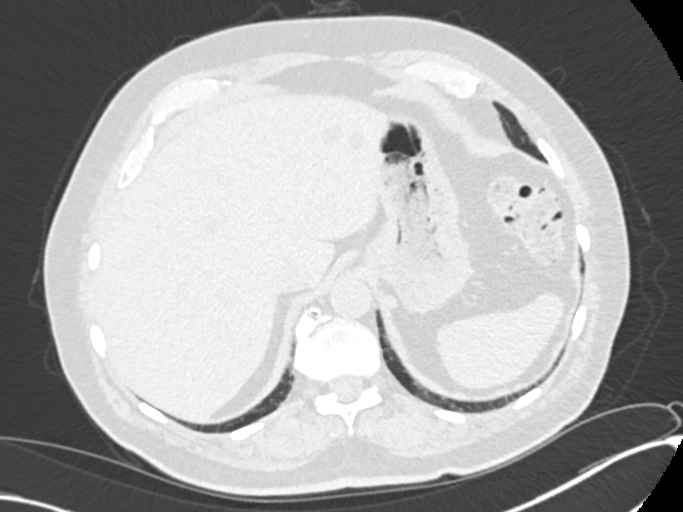
[im 58/163  lung]
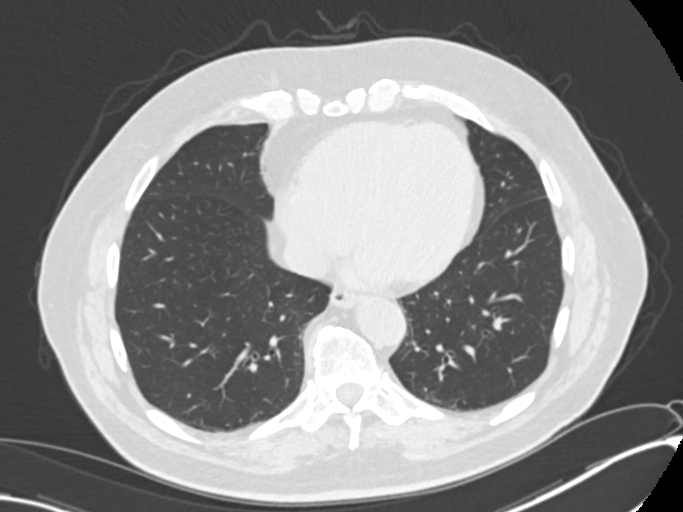
[im 70/163  lung]
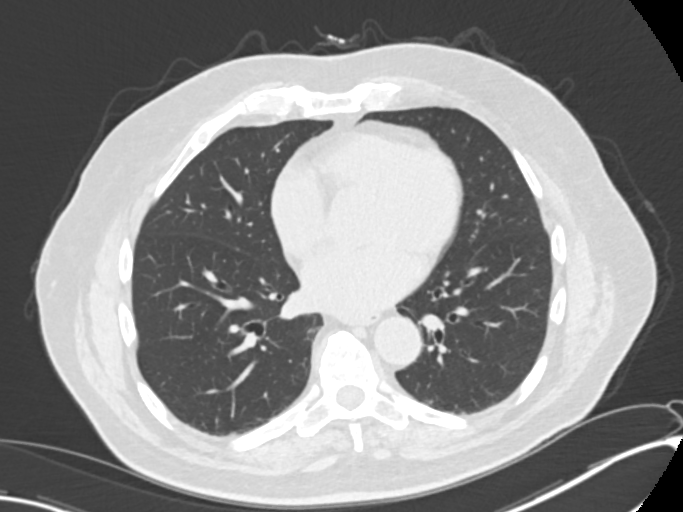
[im 93/163  mediastinal]
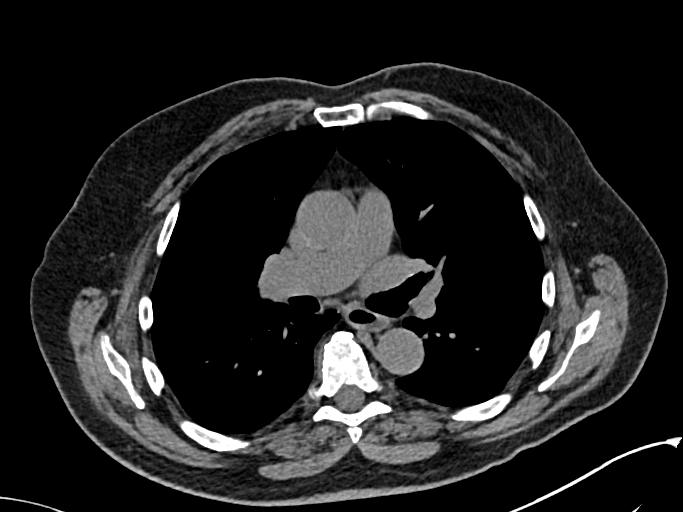
[im 93/163  lung]
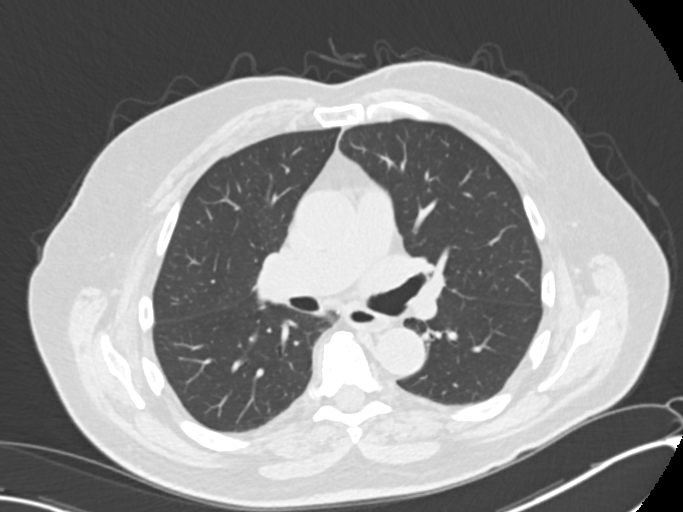
[im 105/163  lung]
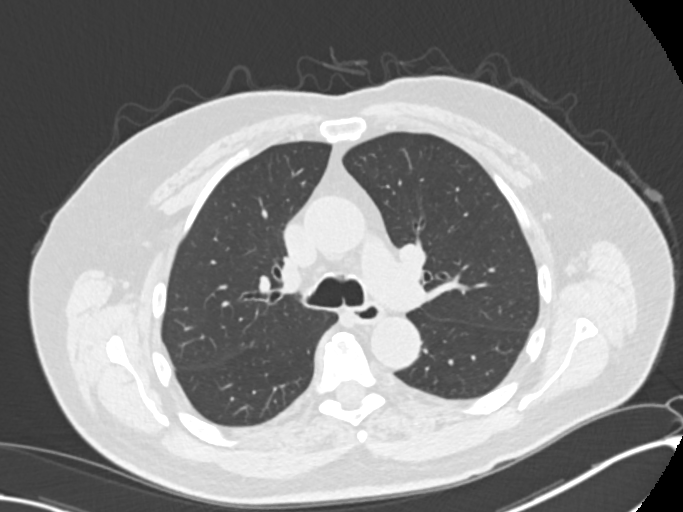
[im 128/163  lung]
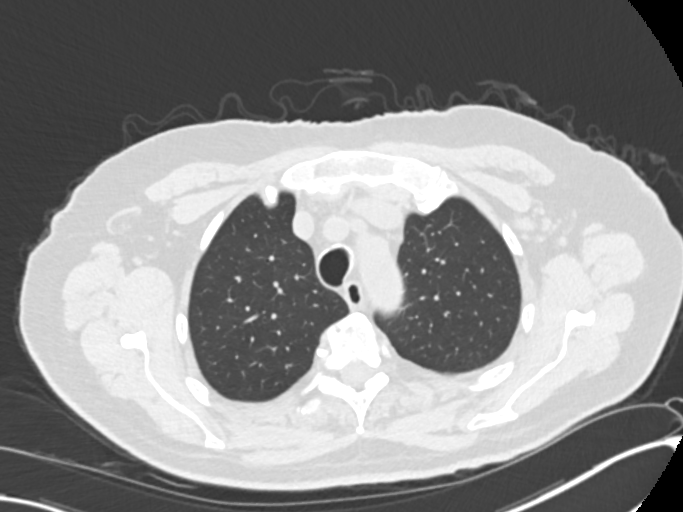
[im 151/163  lung]
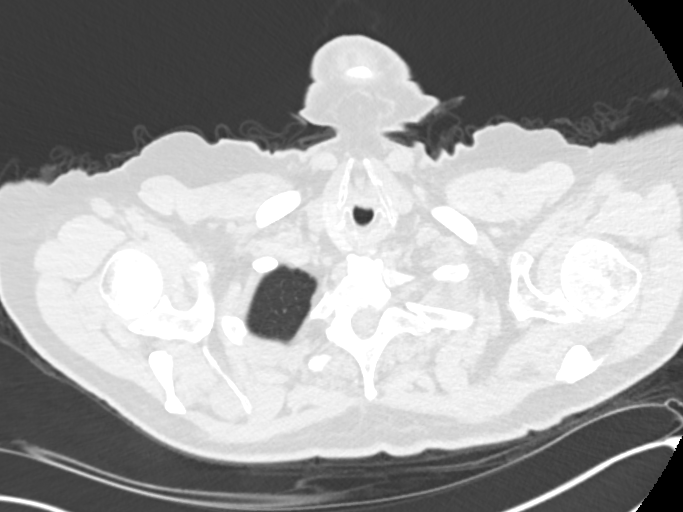

[Series 4: chest 2.00 br40 s3 · coronal · 0.64mm/px · 3 of 156 slices shown (2 of 2)]
[im 32/156  lung]
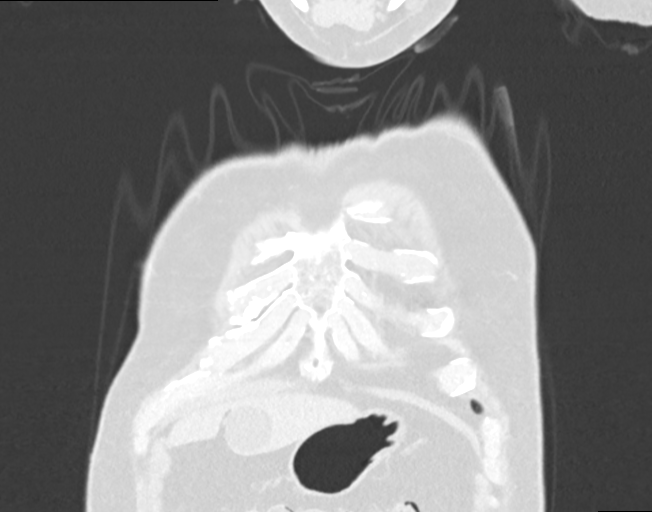
[im 63/156  lung]
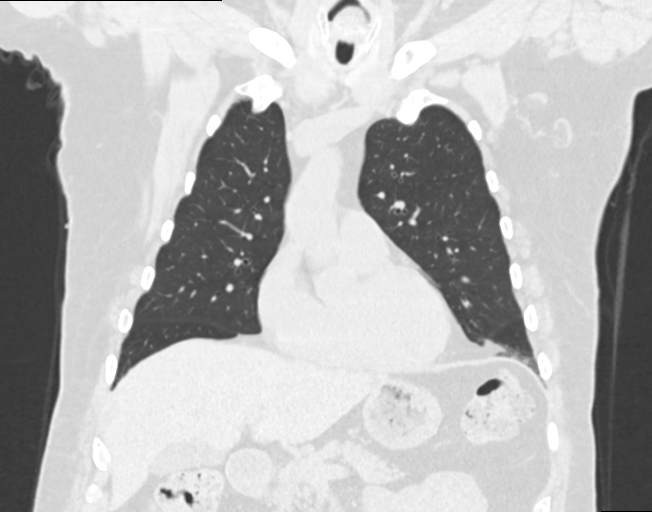
[im 94/156  lung]
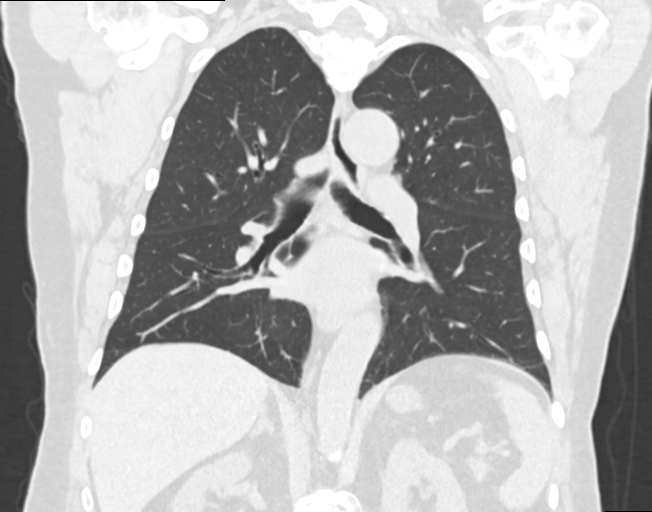

[11 of 36 positions shown; findings below may reference images not displayed]

FINDINGS: Cardiovascular: Atherosclerosis of thoracic aorta is noted without
aneurysm formation. Normal cardiac size. No pericardial effusion.
Mild coronary artery calcifications are noted.

Mediastinum/Nodes: No enlarged mediastinal or axillary lymph nodes.
Thyroid gland, trachea, and esophagus demonstrate no significant
findings.

Lungs/Pleura: No pneumothorax or pleural effusion is noted. Right
lung is clear. 7 mm nodule is noted in the left lower lobe best seen
on image number 81 of series 8.

Upper Abdomen: Cholelithiasis is noted. Multiple hepatic cysts are
noted.

Musculoskeletal: No chest wall mass or suspicious bone lesions
identified.
IMPRESSION: 1. 7 mm nodule is noted in the left lower lobe. Non-contrast chest
CT at 6-12 months is recommended. If the nodule is stable at time of
repeat CT, then future CT at 18-24 months (from today's scan) is
considered optional for low-risk patients, but is recommended for
high-risk patients. This recommendation follows the consensus
statement: Guidelines for Management of Incidental Pulmonary Nodules
Detected on CT Images: From the [HOSPITAL] 3759; Radiology
3759; [DATE]. These results will be called to the ordering
clinician or representative by the Radiologist Assistant, and
communication documented in the PACS or zVision Dashboard.
2. Mild coronary artery calcifications are noted.
3. Cholelithiasis.
4. Multiple hepatic cysts.
5. Aortic atherosclerosis.

Aortic Atherosclerosis ([id number]).

## 2021-02-27 ENCOUNTER — Ambulatory Visit (INDEPENDENT_AMBULATORY_CARE_PROVIDER_SITE_OTHER): Payer: Medicare Other | Admitting: Neurology

## 2021-02-27 ENCOUNTER — Other Ambulatory Visit: Payer: Self-pay

## 2021-02-27 ENCOUNTER — Encounter: Payer: Self-pay | Admitting: Neurology

## 2021-02-27 VITALS — BP 134/77 | HR 77 | Ht 68.0 in | Wt 189.2 lb

## 2021-02-27 DIAGNOSIS — G3184 Mild cognitive impairment, so stated: Secondary | ICD-10-CM

## 2021-02-27 NOTE — Patient Instructions (Signed)
Return to clinic in 1 year.

## 2021-02-27 NOTE — Progress Notes (Signed)
Follow-up Visit   Date: 02/27/21   Mitchell Walker MRN: [id number] DOB: July 28, 1934   Interim History: BRIDGER PIZZI is a 87 y.o. right-handed Caucasian male returning to the clinic for follow-up of MCI.  The patient was accompanied to the clinic by wife who also provides collateral information.    UPDATE 02/27/2021:  He is here for 1 year visit.  Overall, there has not been a significant change with day-to-day activities.  Wife says that he is more forgetful and needs to be reminded of appointments, tasks, and sometimes can forget where he is going.  He continues to drive and has not got lost, although wife tends to navigate.  She also manages finances.  Otherwise, he performs his own IADLs.  Mood is good.  He moved to [facility] independent living and will be organizing their home to be able to place it on the market.  He stay social and enjoys meeting friends for coffee.   Medications:  Current Outpatient Medications on File Prior to Visit  Medication Sig Dispense Refill  . albuterol (PROAIR HFA) 108 (90 Base) MCG/ACT inhaler Inhale 2 puffs into the lungs every 6 (six) hours as needed for wheezing or shortness of breath (or cough). 1 each 2  . amLODipine (NORVASC) 10 MG tablet Take 1 tablet by mouth once daily 90 tablet 0  . benazepril-hydrochlorthiazide (LOTENSIN HCT) 20-25 MG tablet Take 1 tablet by mouth once daily 90 tablet 0  . Cholecalciferol (VITAMIN D-3) 125 MCG (5000 UT) TABS Take 1 tablet by mouth daily.    . Omega-3 Fatty Acids (FISH OIL) 1000 MG CAPS Take 1 capsule by mouth daily.    . vitamin B-12 (CYANOCOBALAMIN) 1000 MCG tablet Take 1,000 mcg by mouth daily.     No current facility-administered medications on file prior to visit.    Allergies:  Allergies  Allergen Reactions  . Celecoxib     REACTION: Upset GI  . Sulfonamide Derivatives     REACTION: rash    Vital Signs:  BP 134/77 (BP Location: Left Arm, Patient Position: Sitting, Cuff Size: Normal)   Pulse 77    Ht 5\' 8"  (1.727 m)   Wt 189 lb 3.2 oz (85.8 kg)   SpO2 95%   BMI 28.77 kg/m    Neurological Exam: MENTAL STATUS including orientation to time, place, person, recent and remote memory, attention span and concentration, language, and fund of knowledge is normal.  Speech is not dysarthric.  Oriented to year, month, day of the week, date was incorrect. Serial presidents x 2 correct.   CRANIAL NERVES:  No visual field defects.  Pupils equal round and reactive to light.  Normal conjugate, extra-ocular eye movements in all directions of gaze.  No ptosis.    MOTOR:  Motor strength is 5/5 in all extremities  COORDINATION/GAIT:  Gait narrow based and stable.   Data: Labs 10/22/2016: Vitamin B12 422, RPR neg, TSH 2.34, HIV NR  MRI brain wo contrast 04/20/2017: 1. No acute intracranial abnormality. 2. Moderate for age chronic microvascular ischemic changes and moderate diffuse parenchymal volume loss of the brain. 3. Moderate diffuse paranasal sinus disease greatest in ethmoid and right sphenoid sinuses.  Neuropsychological testing 06/23/2017:  Amnestic mild cognitive impairment  IMPRESSION/PLAN: Mild cognitive impairment, amnestic.  Overall stable and remains independent with IADLs and ADLs, except for finances and navigational skills with driving.  He has moved with his wife to [place] independent living which will allow him to be more social and  active  - Continue to monitor  - Encouraged him to start exercise program and continue to engage in mentally stimulating activities  Return to clinic in 1 year  Thank you for allowing me to participate in patient's care.  If I can answer any additional questions, I would be pleased to do so.    Sincerely,    Safire Gordin K. Posey Pronto, DO

## 2021-03-18 DIAGNOSIS — Z85828 Personal history of other malignant neoplasm of skin: Secondary | ICD-10-CM | POA: Diagnosis not present

## 2021-03-18 DIAGNOSIS — L718 Other rosacea: Secondary | ICD-10-CM | POA: Diagnosis not present

## 2021-03-18 DIAGNOSIS — C44319 Basal cell carcinoma of skin of other parts of face: Secondary | ICD-10-CM | POA: Diagnosis not present

## 2021-03-18 DIAGNOSIS — L570 Actinic keratosis: Secondary | ICD-10-CM | POA: Diagnosis not present

## 2021-03-25 NOTE — Progress Notes (Signed)
Phone [phone] In person visit   Subjective:   Mitchell Walker is a 87 y.o. year old very pleasant male patient who presents for/with See problem oriented charting Chief Complaint  Patient presents with  . Hyperlipidemia  . Hypertension  . Diabetes  . B12 Deficiency    This visit occurred during the SARS-CoV-2 public health emergency.  Safety protocols were in place, including screening questions prior to the visit, additional usage of staff PPE, and extensive cleaning of exam room while observing appropriate contact time as indicated for disinfecting solutions.   Past Medical History-  Patient Active Problem List   Diagnosis Date Noted  . Advanced directives, counseling/discussion 05/20/2017    Priority: High  . Diabetes mellitus ([place]) 05/20/2017    Priority: High  . B12 deficiency 05/02/2017    Priority: High  . MCI (mild cognitive impairment) 10/22/2016    Priority: High  . Aortic atherosclerosis ([place]) 10/20/2020    Priority: Medium  . Emphysema lung ([place]) 10/01/2020    Priority: Medium  . Glaucoma 12/27/2017    Priority: Medium  . Hyperlipidemia associated with type 2 diabetes mellitus ([place]) 02/03/2009    Priority: Medium  . Hypertension associated with diabetes ([place]) 07/17/2007    Priority: Medium  . Former smoker 05/21/2015    Priority: Low  . Adenomatous colon polyp 05/21/2015    Priority: Low  . Chronic frontal sinusitis 12/30/2010    Priority: Low  . ACNE ROSACEA 04/15/2010    Priority: Low  . Actinic keratosis 02/03/2009    Priority: Low  . Basal cell carcinoma of skin 02/29/2008    Priority: Low  . ERECTILE DYSFUNCTION 12/11/2007    Priority: Low  . Osteoarthritis 07/17/2007    Priority: Low    Medications- reviewed and updated Current Outpatient Medications  Medication Sig Dispense Refill  . amLODipine (NORVASC) 10 MG tablet Take 1 tablet by mouth once daily 90 tablet 0  . benazepril-hydrochlorthiazide (LOTENSIN HCT) 20-25 MG tablet Take 1  tablet by mouth once daily 90 tablet 0  . Cholecalciferol (VITAMIN D-3) 125 MCG (5000 UT) TABS Take 1 tablet by mouth daily.    . Omega-3 Fatty Acids (FISH OIL) 1000 MG CAPS Take 1 capsule by mouth daily.    . vitamin B-12 (CYANOCOBALAMIN) 1000 MCG tablet Take 1,000 mcg by mouth daily.    Marland Kitchen albuterol (PROAIR HFA) 108 (90 Base) MCG/ACT inhaler Inhale 2 puffs into the lungs every 6 (six) hours as needed for wheezing or shortness of breath (or cough). (Patient not taking: Reported on 03/31/2021) 1 each 2   No current facility-administered medications for this visit.     Objective:  BP 124/66   Pulse 66   Temp 98.4 F (36.9 C) (Temporal)   Ht 5\' 8"  (1.727 m)   Wt 188 lb 9.6 oz (85.5 kg)   SpO2 94%   BMI 28.68 kg/m  Gen: NAD, resting comfortably CV: RRR no murmurs rubs or gallops Lungs: CTAB no crackles, wheeze, rhonchi Abdomen: soft/nontender/nondistended/normal bowel sounds.  Ext: no edema Skin: warm, dry  Diabetic Foot Exam - Simple   Simple Foot Form Diabetic Foot exam was performed with the following findings: Yes 03/31/2021 10:05 AM  Visual Inspection No deformities, no ulcerations, no other skin breakdown bilaterally: Yes Sensation Testing Intact to touch and monofilament testing bilaterally: Yes Pulse Check Posterior Tibialis and Dorsalis pulse intact bilaterally: Yes Comments        Assessment and Plan   % Mild cognitive impairment-follows with Dr. Posey Pronto -  visit just last month- some worsening- follow up in 1 year  #Glaucoma- no issues since laser treatment for narrow angle glaucoma with surgery lately- doing well lately and no retinopathy as well  #hypertension S: compliant with Amlodipine 10 mg and Benazepril-HCT 20-25 mg daily.   BP Readings from Last 3 Encounters:  03/31/21 124/66  02/27/21 134/77  11/13/20 130/60   A/P: Stable. Continue current medications.   # Diabetes S: diet controlled. No Rx.  CBGs- does not check Exercise and diet-  Doing exercise  3-4x a week with chair yoga/movement at [facility] home. Diet has loosened some at [facility] home. Weight stable -Last A1c was 6.0 on point-of-care machine in October-does not look like it got added by the team Lab Results  Component Value Date   HGBA1C 6.3 04/28/2020   HGBA1C 5.9 (A) 01/28/2020   HGBA1C 6.0 06/27/2019   A/P: Well-controlled on last check-update A1c with labs today. Likely continue without meds  #hyperlipidemia S: patient takes Omega-3. Benefit of primary prevention at his age and with memory loss is unclear-on the other hand on CT patient had mild plaque on the heart as well as aortic atherosclerosis Lab Results  Component Value Date   CHOL 179 06/27/2019   HDL 47.60 06/27/2019   LDLCALC 117 (H) 06/27/2019   LDLDIRECT 127.3 02/11/2010   TRIG 70.0 06/27/2019   CHOLHDL 4 06/27/2019    A/P: Mild elevations in lipids-continue to work on healthy eating/regular exercise. We discussed aortic atherosclerosis and coronary artery calcifications but this is not enough for patient to change his mind  # B12 Deficiency S:  He remains on daily supplements 1000 mcg  Lab Results  Component Value Date   [id number] 196 06/27/2019   A/P: Due for repeat B12 evaluation-ordered today-likely continue current medication  #Pulmonary nodule-7 mm nodule in left lower lobe on 10/17/2020 with 6 to 12 months repeat recommended.  We discussed potentially rechecking at this point - he prefers to wait until next visit and then will need 1 more scan at a year. No new shortness of breath- cough about the same  # COPD- only used albuterol 3-4 x- did provide some relief of cough. Overall stable- continue prn use  # HM- Dr. Fuller Plan recommended another colonoscopy- patient has opted out due to age and comorbid conditions at this time. We will check in each visit. No blood in stool or abdominal pain -had 4th covid shot and no issues  # prior kidney stones/blood in urine- prefers to hold off on follow up with  urology unless still has blood in urine. Stone dissolved before urolgoy contacted them- no recurrence after passing stone  Recommended follow up: Return in about 6 months (around 09/30/2021) for follow up- or sooner if needed. Future Appointments  Date Time Provider [place]  08/17/2021  1:00 PM [facility] CCM PHARMACIST [facility]  03/01/2022 10:30 AM Patel, Donika K, DO [facility] None    Lab/Order associations:   ICD-10-CM   1. Hypertension associated with diabetes ([place])  E11.59    I15.2   2. Hyperlipidemia associated with type 2 diabetes mellitus (HCC)  E11.69 CBC with Differential/Platelet   E78.5 Comprehensive metabolic panel    Lipid panel  3. Type 2 diabetes mellitus without complication, without long-term current use of insulin (HCC)  E11.9 Hemoglobin A1c    CBC with Differential/Platelet    Comprehensive metabolic panel    Lipid panel  4. B12 deficiency  E53.8   5. Encounter for screening colonoscopy  Z12.11  6. Pulmonary emphysema, unspecified emphysema type (HCC) Chronic J43.9   7. Aortic atherosclerosis (HCC) Chronic I70.0     No orders of the defined types were placed in this encounter.   Return precautions advised.  Garret Reddish, MD

## 2021-03-25 NOTE — Patient Instructions (Addendum)
Health Maintenance Due  Topic Date Due  . COLONOSCOPY -opts out for now 08/03/2020   Team please log 4th covid shot- moderna  Please stop by lab before you go If you have mychart- we will send your results within 3 business days of [place] receiving them.  If you do not have mychart- we will call you about results within 5 business days of [place] receiving them.  *please also note that you will see labs on mychart as soon as they post. I will later go in and write notes on them- will say "notes from Dr. Yong Channel"  No changes today unless labs lead [place] to make changes  Recommended follow up: Return in about 6 months (around 09/30/2021) for follow up- or sooner if needed.

## 2021-03-26 DIAGNOSIS — Z23 Encounter for immunization: Secondary | ICD-10-CM | POA: Diagnosis not present

## 2021-03-31 ENCOUNTER — Ambulatory Visit (INDEPENDENT_AMBULATORY_CARE_PROVIDER_SITE_OTHER): Payer: Medicare Other | Admitting: Family Medicine

## 2021-03-31 ENCOUNTER — Other Ambulatory Visit: Payer: Self-pay

## 2021-03-31 ENCOUNTER — Encounter: Payer: Self-pay | Admitting: Family Medicine

## 2021-03-31 VITALS — BP 124/66 | HR 66 | Temp 98.4°F | Ht 68.0 in | Wt 188.6 lb

## 2021-03-31 DIAGNOSIS — R319 Hematuria, unspecified: Secondary | ICD-10-CM

## 2021-03-31 DIAGNOSIS — Z1211 Encounter for screening for malignant neoplasm of colon: Secondary | ICD-10-CM | POA: Diagnosis not present

## 2021-03-31 DIAGNOSIS — I152 Hypertension secondary to endocrine disorders: Secondary | ICD-10-CM | POA: Diagnosis not present

## 2021-03-31 DIAGNOSIS — E1169 Type 2 diabetes mellitus with other specified complication: Secondary | ICD-10-CM

## 2021-03-31 DIAGNOSIS — E119 Type 2 diabetes mellitus without complications: Secondary | ICD-10-CM | POA: Diagnosis not present

## 2021-03-31 DIAGNOSIS — I700 Atherosclerosis of aorta: Secondary | ICD-10-CM | POA: Diagnosis not present

## 2021-03-31 DIAGNOSIS — E538 Deficiency of other specified B group vitamins: Secondary | ICD-10-CM

## 2021-03-31 DIAGNOSIS — E1159 Type 2 diabetes mellitus with other circulatory complications: Secondary | ICD-10-CM | POA: Diagnosis not present

## 2021-03-31 DIAGNOSIS — J439 Emphysema, unspecified: Secondary | ICD-10-CM

## 2021-03-31 DIAGNOSIS — E785 Hyperlipidemia, unspecified: Secondary | ICD-10-CM

## 2021-03-31 LAB — LIPID PANEL
Cholesterol: 169 mg/dL (ref 0–200)
HDL: 47.8 mg/dL (ref >39.00)
LDL Cholesterol: 104 mg/dL — ABNORMAL HIGH (ref 0–99)
NonHDL: 120.89
Total CHOL/HDL Ratio: 4
Triglycerides: 82 mg/dL (ref 0.0–149.0)
VLDL: 16.4 mg/dL (ref 0.0–40.0)

## 2021-03-31 LAB — COMPREHENSIVE METABOLIC PANEL
ALT: 13 U/L (ref 0–53)
AST: 17 U/L (ref 0–37)
Albumin: 3.8 g/dL (ref 3.5–5.2)
Alkaline Phosphatase: 72 U/L (ref 39–117)
BUN: 11 mg/dL (ref 6–23)
CO2: 27 mEq/L (ref 19–32)
Calcium: 9 mg/dL (ref 8.4–10.5)
Chloride: 93 mEq/L — ABNORMAL LOW (ref 96–112)
Creatinine, Ser: 0.91 mg/dL (ref 0.40–1.50)
GFR: 77.29 mL/min (ref >60.00)
Glucose, Bld: 122 mg/dL — ABNORMAL HIGH (ref 70–99)
Potassium: 3.8 mEq/L (ref 3.5–5.1)
Sodium: 128 mEq/L — ABNORMAL LOW (ref 135–145)
Total Bilirubin: 0.9 mg/dL (ref 0.2–1.2)
Total Protein: 6.9 g/dL (ref 6.0–8.3)

## 2021-03-31 LAB — POC URINALSYSI DIPSTICK (AUTOMATED)
Bilirubin, UA: NEGATIVE
Blood, UA: NEGATIVE
Glucose, UA: NEGATIVE
Ketones, UA: NEGATIVE
Leukocytes, UA: NEGATIVE
Nitrite, UA: NEGATIVE
Protein, UA: NEGATIVE
Spec Grav, UA: 1.015 (ref 1.010–1.025)
Urobilinogen, UA: 0.2 E.U./dL
pH, UA: 6.5 (ref 5.0–8.0)

## 2021-03-31 LAB — VITAMIN B12: Vitamin B-12: 995 pg/mL — ABNORMAL HIGH (ref 211–911)

## 2021-03-31 LAB — CBC WITH DIFFERENTIAL/PLATELET
Basophils Absolute: 0.1 10*3/uL (ref 0.0–0.1)
Basophils Relative: 1.1 % (ref 0.0–3.0)
Eosinophils Absolute: 0.4 10*3/uL (ref 0.0–0.7)
Eosinophils Relative: 6.5 % — ABNORMAL HIGH (ref 0.0–5.0)
HCT: 39.4 % (ref 39.0–52.0)
Hemoglobin: 13.4 g/dL (ref 13.0–17.0)
Lymphocytes Relative: 30.4 % (ref 12.0–46.0)
Lymphs Abs: 2 10*3/uL (ref 0.7–4.0)
MCHC: 34 g/dL (ref 30.0–36.0)
MCV: 85.2 fl (ref 78.0–100.0)
Monocytes Absolute: 0.6 10*3/uL (ref 0.1–1.0)
Monocytes Relative: 9.5 % (ref 3.0–12.0)
Neutro Abs: 3.4 10*3/uL (ref 1.4–7.7)
Neutrophils Relative %: 52.5 % (ref 43.0–77.0)
Platelets: 248 10*3/uL (ref 150.0–400.0)
RBC: 4.63 Mil/uL (ref 4.22–5.81)
RDW: 13.8 % (ref 11.5–15.5)
WBC: 6.4 10*3/uL (ref 4.0–10.5)

## 2021-03-31 LAB — HEMOGLOBIN A1C: Hgb A1c MFr Bld: 6.8 % — ABNORMAL HIGH (ref 4.6–6.5)

## 2021-03-31 NOTE — Addendum Note (Signed)
Addended by: Brandy Hale on: 03/31/2021 10:35 AM   Modules accepted: Orders

## 2021-04-01 ENCOUNTER — Encounter: Payer: Self-pay | Admitting: Family Medicine

## 2021-04-01 ENCOUNTER — Other Ambulatory Visit: Payer: Self-pay

## 2021-04-01 DIAGNOSIS — E871 Hypo-osmolality and hyponatremia: Secondary | ICD-10-CM

## 2021-04-01 MED ORDER — BENAZEPRIL HCL 20 MG PO TABS: 20.0000 mg | ORAL_TABLET | Freq: Every day | ORAL | 3 refills | Status: DC

## 2021-04-02 ENCOUNTER — Other Ambulatory Visit: Payer: Self-pay

## 2021-04-02 MED ORDER — BENAZEPRIL HCL 20 MG PO TABS: 20.0000 mg | ORAL_TABLET | Freq: Every day | ORAL | 3 refills | Status: DC

## 2021-04-20 NOTE — Telephone Encounter (Signed)
Team now time to schedule the CT scan without contrast of chest for patient under pulmonary nodule- please let him know when ordered. Thanks!

## 2021-04-20 NOTE — Telephone Encounter (Signed)
I have ordered this, please let me know if ordered correctly and when scheduled.

## 2021-04-20 NOTE — Addendum Note (Signed)
Addended by: Clyde Lundborg A on: 04/20/2021 01:15 PM   Modules accepted: Orders

## 2021-05-04 NOTE — Telephone Encounter (Signed)
Any status update on this?  Thanks for researching

## 2021-05-05 ENCOUNTER — Other Ambulatory Visit (INDEPENDENT_AMBULATORY_CARE_PROVIDER_SITE_OTHER): Payer: Medicare Other

## 2021-05-05 ENCOUNTER — Other Ambulatory Visit: Payer: Self-pay

## 2021-05-05 ENCOUNTER — Other Ambulatory Visit: Payer: Self-pay | Admitting: Family Medicine

## 2021-05-05 DIAGNOSIS — E871 Hypo-osmolality and hyponatremia: Secondary | ICD-10-CM | POA: Diagnosis not present

## 2021-05-05 LAB — COMPREHENSIVE METABOLIC PANEL
ALT: 16 U/L (ref 0–53)
AST: 18 U/L (ref 0–37)
Albumin: 4 g/dL (ref 3.5–5.2)
Alkaline Phosphatase: 64 U/L (ref 39–117)
BUN: 15 mg/dL (ref 6–23)
CO2: 28 mEq/L (ref 19–32)
Calcium: 8.8 mg/dL (ref 8.4–10.5)
Chloride: 101 mEq/L (ref 96–112)
Creatinine, Ser: 0.91 mg/dL (ref 0.40–1.50)
GFR: 77.24 mL/min (ref >60.00)
Glucose, Bld: 155 mg/dL — ABNORMAL HIGH (ref 70–99)
Potassium: 3.7 mEq/L (ref 3.5–5.1)
Sodium: 135 mEq/L (ref 135–145)
Total Bilirubin: 0.8 mg/dL (ref 0.2–1.2)
Total Protein: 6.9 g/dL (ref 6.0–8.3)

## 2021-05-06 NOTE — Telephone Encounter (Signed)
Can you help with this please since no response from [place]?

## 2021-05-06 NOTE — Telephone Encounter (Signed)
Called and lm for pt tcb, when pt calls back please give him message below.

## 2021-05-07 ENCOUNTER — Telehealth: Payer: Self-pay

## 2021-05-07 NOTE — Chronic Care Management (AMB) (Signed)
    Chronic Care Management Pharmacy Assistant   Name: Mitchell Walker  MRN: [id number] DOB: August 01, 1934  Reason for Encounter: General Adherence Call  Recent office visits:  03/31/21- Garret Reddish, MD- Chronic conditions addressed, no medication changes, follow up 6 months   Recent consult visits:  02/27/21- Narda Amber, DO (Neurology)- seen for follow up of MCI, no medication changes, follow up 1 yr   Hospital visits:  None in previous 6 months  Medications: Outpatient Encounter Medications as of 05/07/2021  Medication Sig  . albuterol (PROAIR HFA) 108 (90 Base) MCG/ACT inhaler Inhale 2 puffs into the lungs every 6 (six) hours as needed for wheezing or shortness of breath (or cough). (Patient not taking: Reported on 03/31/2021)  . amLODipine (NORVASC) 10 MG tablet Take 1 tablet by mouth once daily  . benazepril (LOTENSIN) 20 MG tablet Take 1 tablet (20 mg total) by mouth daily.  . Cholecalciferol (VITAMIN D-3) 125 MCG (5000 UT) TABS Take 1 tablet by mouth daily.  . Omega-3 Fatty Acids (FISH OIL) 1000 MG CAPS Take 1 capsule by mouth daily.  . vitamin B-12 (CYANOCOBALAMIN) 1000 MCG tablet Take 1,000 mcg by mouth daily.   No facility-administered encounter medications on file as of 05/07/2021.   Several unsuccessful attempts made to contact patient    Have you had any problems recently with your health?  Have you  had any problems with your pharmacy?  What issues or side effects are you having with your medications?  What would you like me to pass along to Edison Nasuti Potts,CPP for them to help you with?   What can we do to take care of you better?  Star Rating Drugs: Benazepril 20 mg- 90 DS last filled 04/01/21   Wilford Sports CPA, CMA

## 2021-06-24 ENCOUNTER — Ambulatory Visit
Admission: RE | Admit: 2021-06-24 | Discharge: 2021-06-24 | Disposition: A | Discharge status: Home / Self Care | Payer: Medicare Other | Source: Ambulatory Visit | Attending: Family Medicine | Admitting: Family Medicine

## 2021-06-24 DIAGNOSIS — R911 Solitary pulmonary nodule: Secondary | ICD-10-CM

## 2021-06-24 DIAGNOSIS — I700 Atherosclerosis of aorta: Secondary | ICD-10-CM | POA: Diagnosis not present

## 2021-06-25 ENCOUNTER — Telehealth: Payer: Self-pay

## 2021-06-25 NOTE — Telephone Encounter (Signed)
I left a msg asking the pt to call and schedule AWV-S with nurse, Otila Kluver on or after 07/02/21.  If pt calls back, please schedule on or after 07/02/21.

## 2021-06-29 ENCOUNTER — Other Ambulatory Visit: Payer: Self-pay | Admitting: Family Medicine

## 2021-07-03 ENCOUNTER — Other Ambulatory Visit: Payer: Self-pay

## 2021-07-03 MED ORDER — AMLODIPINE BESYLATE 10 MG PO TABS: 10.0000 mg | ORAL_TABLET | Freq: Every day | ORAL | 3 refills | Status: DC

## 2021-07-28 ENCOUNTER — Telehealth: Payer: Self-pay | Admitting: Pharmacist

## 2021-07-28 NOTE — Chronic Care Management (AMB) (Addendum)
    Chronic Care Management Pharmacy Assistant   Name: Mitchell Walker  MRN: [id number] DOB: 09/28/1934  Reason for Encounter: General Adherence Call    Recent office visits:  None  Recent consult visits:  None  Hospital visits:  None in previous 6 months  Medications: Outpatient Encounter Medications as of 07/28/2021  Medication Sig   albuterol (PROAIR HFA) 108 (90 Base) MCG/ACT inhaler Inhale 2 puffs into the lungs every 6 (six) hours as needed for wheezing or shortness of breath (or cough). (Patient not taking: Reported on 03/31/2021)   amLODipine (NORVASC) 10 MG tablet Take 1 tablet (10 mg total) by mouth daily.   benazepril (LOTENSIN) 20 MG tablet Take 1 tablet (20 mg total) by mouth daily.   Cholecalciferol (VITAMIN D-3) 125 MCG (5000 UT) TABS Take 1 tablet by mouth daily.   Omega-3 Fatty Acids (FISH OIL) 1000 MG CAPS Take 1 capsule by mouth daily.   vitamin B-12 (CYANOCOBALAMIN) 1000 MCG tablet Take 1,000 mcg by mouth daily.   No facility-administered encounter medications on file as of 07/28/2021.   Patient Questions: Have you had any problems recently with your health? Patient states he has not had any problems recently with his health.  Have you had any problems with your pharmacy? Patient states he has not had any problems recently with his pharmacy.  What issues or side effects are you having with your medications? Patient states he is not currently having any issues or side effects with any of his medications.  What would you like me to pass along to Leata Mouse, CPP for him to help you with?  Patient states he does not have anything to pass along at this time.  What can we do to take care of you better? Patient did not have any suggestions.  Star Rating Drugs: Benazepril last filled 06/29/2021 90 DS  Future Appointments  Date Time Provider [place]  08/17/2021  1:00 PM [facility] CCM PHARMACIST [facility]  09/30/2021 10:40 AM [name], MD  [facility]  03/01/2022 10:30 AM Alda Berthold, DO [facility] None     April D Calhoun, [place] Pharmacist Assistant [phone]   10 minutes spent in review, coordination, and documentation.  Reviewed by: Beverly Milch, PharmD Clinical Pharmacist [phone]

## 2021-08-17 ENCOUNTER — Telehealth: Payer: Medicare Other

## 2021-08-27 DIAGNOSIS — Z23 Encounter for immunization: Secondary | ICD-10-CM | POA: Diagnosis not present

## 2021-08-27 DIAGNOSIS — M62838 Other muscle spasm: Secondary | ICD-10-CM | POA: Diagnosis not present

## 2021-08-27 DIAGNOSIS — M542 Cervicalgia: Secondary | ICD-10-CM | POA: Diagnosis not present

## 2021-09-23 NOTE — Progress Notes (Signed)
Phone [phone] In person visit   Subjective:   Mitchell Walker is a 87 y.o. year old very pleasant male patient who presents for/with See problem oriented charting Chief Complaint  Patient presents with   Hyperlipidemia   Hypertension   Diabetes    This visit occurred during the SARS-CoV-2 public health emergency.  Safety protocols were in place, including screening questions prior to the visit, additional usage of staff PPE, and extensive cleaning of exam room while observing appropriate contact time as indicated for disinfecting solutions.   Past Medical History-  Patient Active Problem List   Diagnosis Date Noted   Advanced directives, counseling/discussion 05/20/2017    Priority: 1.   Diabetes mellitus ([place]) 05/20/2017    Priority: 1.   B12 deficiency 05/02/2017    Priority: 1.   MCI (mild cognitive impairment) 10/22/2016    Priority: 1.   Aortic atherosclerosis ([place] ) 10/20/2020    Priority: 2.   Emphysema lung ([place]) 10/01/2020    Priority: 2.   Glaucoma 12/27/2017    Priority: 2.   Hyperlipidemia associated with type 2 diabetes mellitus ([place]) 02/03/2009    Priority: 2.   Hypertension associated with diabetes ([place]) 07/17/2007    Priority: 2.   Former smoker 05/21/2015    Priority: 3.   Adenomatous colon polyp 05/21/2015    Priority: 3.   Chronic frontal sinusitis 12/30/2010    Priority: 3.   ACNE ROSACEA 04/15/2010    Priority: 3.   Actinic keratosis 02/03/2009    Priority: 3.   Basal cell carcinoma of skin 02/29/2008    Priority: 3.   ERECTILE DYSFUNCTION 12/11/2007    Priority: 3.   Osteoarthritis 07/17/2007    Priority: 3.    Medications- reviewed and updated Current Outpatient Medications  Medication Sig Dispense Refill   albuterol (PROAIR HFA) 108 (90 Base) MCG/ACT inhaler Inhale 2 puffs into the lungs every 6 (six) hours as needed for wheezing or shortness of breath (or cough). 1 each 2   amLODipine (NORVASC) 10 MG tablet Take 1 tablet (10 mg  total) by mouth daily. 90 tablet 3   benazepril (LOTENSIN) 20 MG tablet Take 1 tablet (20 mg total) by mouth daily. 90 tablet 3   Cholecalciferol (VITAMIN D-3) 125 MCG (5000 UT) TABS Take 1 tablet by mouth daily.     Omega-3 Fatty Acids (FISH OIL) 1000 MG CAPS Take 1 capsule by mouth daily.     vitamin B-12 (CYANOCOBALAMIN) 1000 MCG tablet Take 1,000 mcg by mouth daily.     No current facility-administered medications for this visit.     Objective:  BP 130/62   Pulse 77   Temp (!) 97.2 F (36.2 C) (Temporal)   Ht 5\' 8"  (1.727 m)   Wt 193 lb 3.2 oz (87.6 kg)   SpO2 95%   BMI 29.38 kg/m  Gen: NAD, resting comfortably CV: RRR no murmurs rubs or gallops Lungs: CTAB no crackles, wheeze, rhonchi Abdomen: soft/nontender/nondistended/normal bowel sounds. N Ext: no edema Skin: warm, dry     Assessment and Plan   #Covid 19- had about 2-3 weeks ago- both he and his wife did reasonably well- did not take antivirals. Had already had bivalent  #Mild cognitive impairment-followed with Dr. Posey Pronto - yearly follow up- last seen 02/27/21 - no changes in memory in last year- does better at [facility]- talking to others helpful   #hypertension S: compliant with Amlodipine 10 mg daily and Benazepril 20 mg Home readings #s: doing well  on home checks- checks weekly BP Readings from Last 3 Encounters:  09/30/21 130/62  03/31/21 124/66  02/27/21 134/77  A/P:  Controlled. Continue current medications.  - recheck sodium- had been running low when also on hctz- now off as of April labs   # Diabetes S: diet controlled. No Rx.  CBGs- not checking- meter was out of date Exercise and diet- diet looser since being at [employer] Results  Component Value Date   HGBA1C 6.8 (H) 03/31/2021   HGBA1C 6.3 04/28/2020   HGBA1C 5.9 (A) 01/28/2020   A/P: Hopefully stable off medication-update A1c with labs-had been trending up -potentially could make dietary changes if a1c slightly high    #hyperlipidemia/aortic atherosclerosis S: patient takes Omega-3 1000 mg daily.on CT patient had mild plaque on the heart as well as aortic atherosclerosis Lab Results  Component Value Date   CHOL 169 03/31/2021   HDL 47.80 03/31/2021   LDLCALC 104 (H) 03/31/2021   LDLDIRECT 127.3 02/11/2010   TRIG 82.0 03/31/2021   CHOLHDL 4 03/31/2021   A/P: diet has loosened some- could tighten this up to help with cholesterol. Not checking lipids until next visit.   For aortic atherosclerosis technically goal would be 43 or less for bad cholesterol-with his age and mild cognitive impairment prefers to remain off medication  #Pulmonary nodule-7 mm nodule in left lower lobe on 10/17/2020 with 6 to 12 months repeat recommended.  did this 06/24/21 and stable- option 18-24 months if high risk but quit smoking over 30 years ago- we may discontinue checks   # COPD- only used albuterol 3-4 x- did provide some relief of cough. Mild dry cough since covid.  Overall stable- continued prn use. Did not have to use with covid.   #Glaucoma- no issues since laser treatment for narrow angle glaucoma surgery    #Colonoscopy-opts out due to age and mild cognitive impairment . Wife did her best to convince him but still opts out  # CCM- patient declines. It is unclear how he was scheduled for 08/17/21 visit- they declined this. We discussed if gets any letter about any bill- to contact us asap so admin can get this removed.   Recommended follow up: Return in about 6 months (around 03/31/2022) for follow-up or sooner if needed. Future Appointments  Date Time Provider [place]  03/01/2022 10:30 AM Narda Amber K, DO [facility] None   Lab/Order associations:   ICD-10-CM   1. Type 2 diabetes mellitus without complication, without long-term current use of insulin (HCC)  E11.9 Comprehensive metabolic panel    Hemoglobin A1c    2. Hypertension associated with diabetes ([place])  E11.59    I15.2     3. Hyperlipidemia  associated with type 2 diabetes mellitus ([place])  E11.69    E78.5     4. Need for immunization against influenza  Z23       No orders of the defined types were placed in this encounter.  I,Jada Bradford,acting as a scribe for Garret Reddish, MD.,have documented all relevant documentation on the behalf of Garret Reddish, MD,as directed by  Garret Reddish, MD while in the presence of Garret Reddish, MD.   I, Garret Reddish, MD, have reviewed all documentation for this visit. The documentation on 09/30/21 for the exam, diagnosis, procedures, and orders are all accurate and complete.   Return precautions advised.  Garret Reddish, MD

## 2021-09-24 ENCOUNTER — Telehealth: Payer: Self-pay

## 2021-09-24 NOTE — Telephone Encounter (Signed)
Is returning a call from [place].  There was no notes entered as to why she was contacted.  States she has faxed over a order for OT in regard to neck pain.  Is requesting this order to be faxed back to [phone].

## 2021-09-24 NOTE — Telephone Encounter (Signed)
Unable to reach [place] , [facility]

## 2021-09-24 NOTE — Telephone Encounter (Signed)
See below

## 2021-09-28 NOTE — Telephone Encounter (Signed)
See below

## 2021-09-28 NOTE — Telephone Encounter (Signed)
Is it okay for me to write OT for the patients neck pain at his request.

## 2021-09-28 NOTE — Telephone Encounter (Signed)
Patient's wife is calling in asking for an update.

## 2021-09-28 NOTE — Telephone Encounter (Signed)
Yes thanks 

## 2021-09-29 NOTE — Telephone Encounter (Signed)
OT has been written and successfully faxed

## 2021-09-29 NOTE — Telephone Encounter (Signed)
See below

## 2021-09-30 ENCOUNTER — Encounter: Payer: Self-pay | Admitting: Family Medicine

## 2021-09-30 ENCOUNTER — Other Ambulatory Visit: Payer: Self-pay

## 2021-09-30 ENCOUNTER — Ambulatory Visit (INDEPENDENT_AMBULATORY_CARE_PROVIDER_SITE_OTHER): Payer: Medicare Other | Admitting: Family Medicine

## 2021-09-30 VITALS — BP 130/62 | HR 77 | Temp 97.2°F | Ht 68.0 in | Wt 193.2 lb

## 2021-09-30 DIAGNOSIS — E119 Type 2 diabetes mellitus without complications: Secondary | ICD-10-CM | POA: Diagnosis not present

## 2021-09-30 DIAGNOSIS — I152 Hypertension secondary to endocrine disorders: Secondary | ICD-10-CM | POA: Diagnosis not present

## 2021-09-30 DIAGNOSIS — E1169 Type 2 diabetes mellitus with other specified complication: Secondary | ICD-10-CM

## 2021-09-30 DIAGNOSIS — E785 Hyperlipidemia, unspecified: Secondary | ICD-10-CM

## 2021-09-30 DIAGNOSIS — Z23 Encounter for immunization: Secondary | ICD-10-CM | POA: Diagnosis not present

## 2021-09-30 DIAGNOSIS — E1159 Type 2 diabetes mellitus with other circulatory complications: Secondary | ICD-10-CM

## 2021-09-30 DIAGNOSIS — M6281 Muscle weakness (generalized): Secondary | ICD-10-CM | POA: Diagnosis not present

## 2021-09-30 LAB — COMPREHENSIVE METABOLIC PANEL
ALT: 15 U/L (ref 0–53)
AST: 20 U/L (ref 0–37)
Albumin: 4 g/dL (ref 3.5–5.2)
Alkaline Phosphatase: 70 U/L (ref 39–117)
BUN: 12 mg/dL (ref 6–23)
CO2: 25 mEq/L (ref 19–32)
Calcium: 9 mg/dL (ref 8.4–10.5)
Chloride: 101 mEq/L (ref 96–112)
Creatinine, Ser: 0.99 mg/dL (ref 0.40–1.50)
GFR: 69.61 mL/min (ref >60.00)
Glucose, Bld: 218 mg/dL — ABNORMAL HIGH (ref 70–99)
Potassium: 3.4 mEq/L — ABNORMAL LOW (ref 3.5–5.1)
Sodium: 135 mEq/L (ref 135–145)
Total Bilirubin: 0.7 mg/dL (ref 0.2–1.2)
Total Protein: 7.3 g/dL (ref 6.0–8.3)

## 2021-09-30 LAB — HEMOGLOBIN A1C: Hgb A1c MFr Bld: 7.5 % — ABNORMAL HIGH (ref 4.6–6.5)

## 2021-09-30 NOTE — Patient Instructions (Addendum)
Health Maintenance Due  Topic Date Due   COLONOSCOPY  -Hold off at this time/declines  08/03/2020   INFLUENZA VACCINE - high dose shot done today in office.  07/13/2021   Please stop by lab before you go If you have mychart- we will send your results within 3 business days of [place] receiving them.  If you do not have mychart- we will call you about results within 5 business days of [place] receiving them.  *please also note that you will see labs on mychart as soon as they post. I will later go in and write notes on them- will say "notes from Dr. Yong Channel"  Recommended follow up: Return in about 6 months (around 03/31/2022) for follow-up or sooner if needed.   I am so glad you're doing okay after having covid!

## 2021-10-05 DIAGNOSIS — M6281 Muscle weakness (generalized): Secondary | ICD-10-CM | POA: Diagnosis not present

## 2021-10-06 DIAGNOSIS — M6281 Muscle weakness (generalized): Secondary | ICD-10-CM | POA: Diagnosis not present

## 2021-10-10 DIAGNOSIS — M6281 Muscle weakness (generalized): Secondary | ICD-10-CM | POA: Diagnosis not present

## 2021-10-15 IMAGING — CT CT CHEST W/O CM
1 series · 16 of 34 positions shown, 20 images · non-contrast
Comparison: Chest CT 10/17/2020

CLINICAL DATA: Nodule follow-up

EXAM:
CT CHEST WITHOUT CONTRAST
TECHNIQUE: Multidetector CT imaging of the chest was performed following the
standard protocol without IV contrast.

[Series 2: chest w/(date) · axial · 0.68mm/px · z∈[-312,-24]mm · 16 of 164 slices shown, 20 images]
[im 13/164  mediastinal]
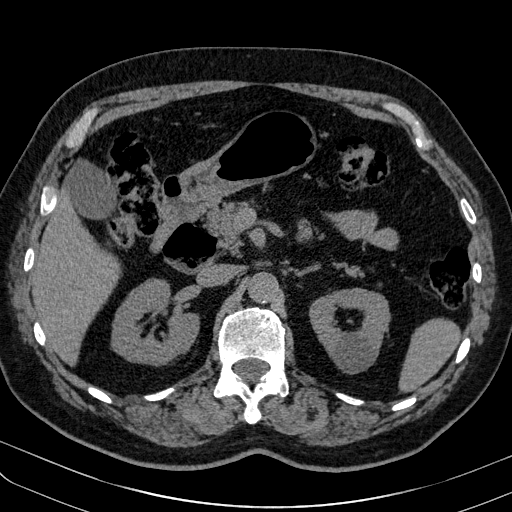
[im 13/164  lung]
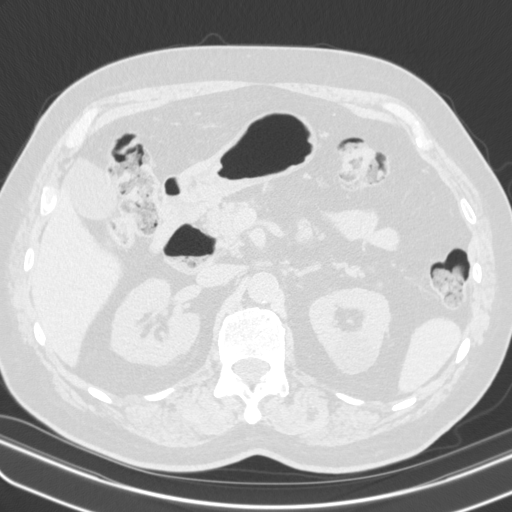
[im 25/164  lung]
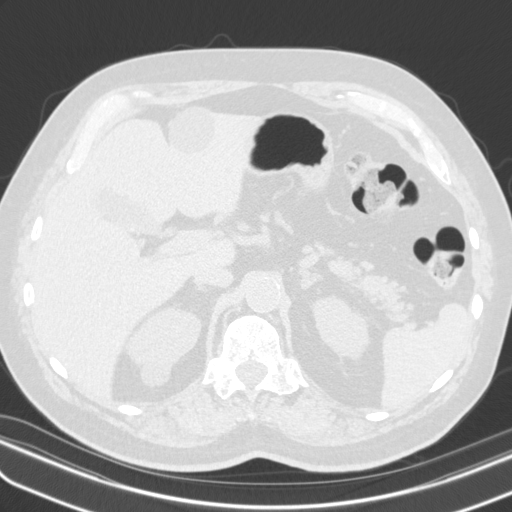
[im 33/164  lung]
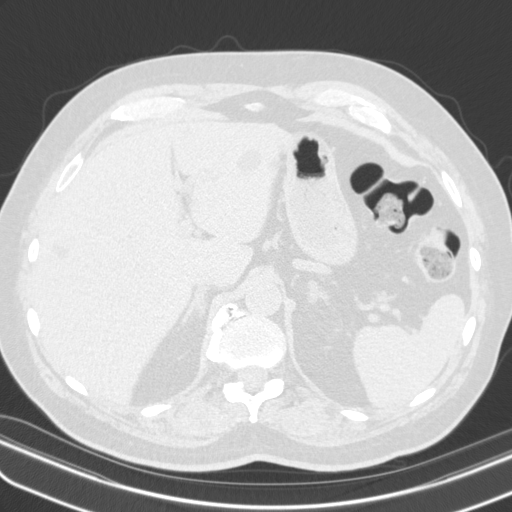
[im 43/164  lung]
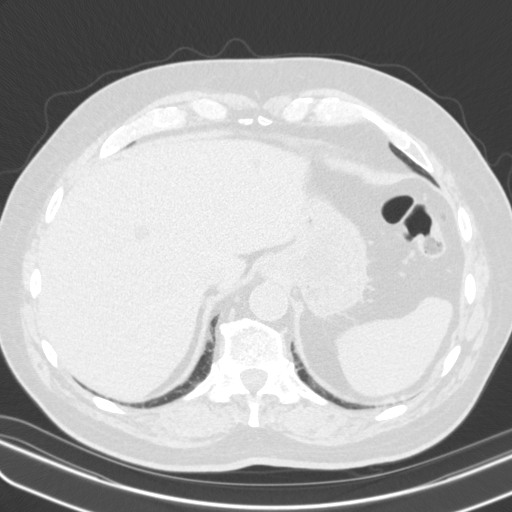
[im 55/164  mediastinal]
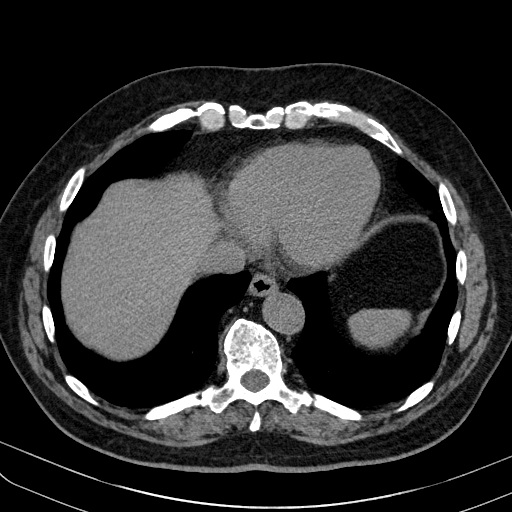
[im 55/164  lung]
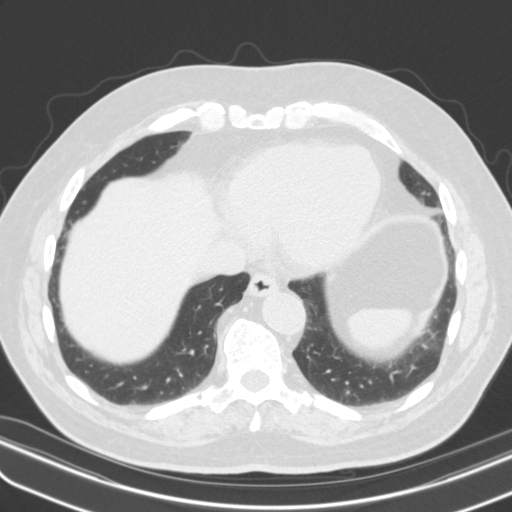
[im 66/164  lung]
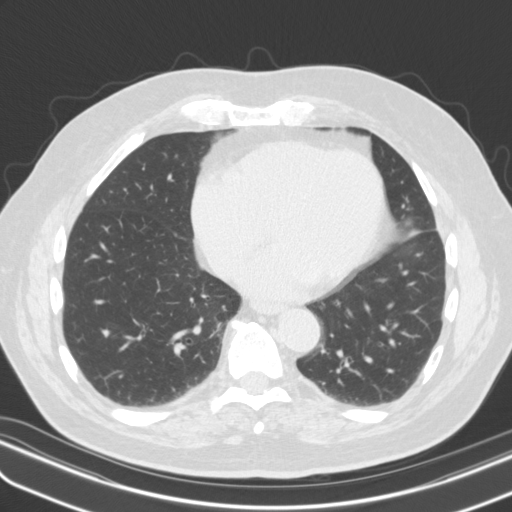
[im 73/164  lung]
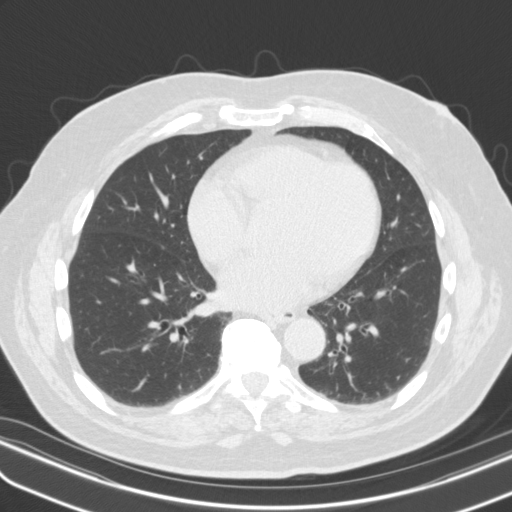
[im 79/164  lung]
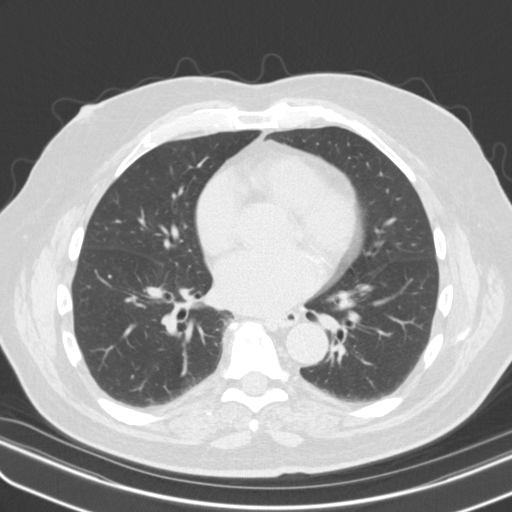
[im 87/164  mediastinal]
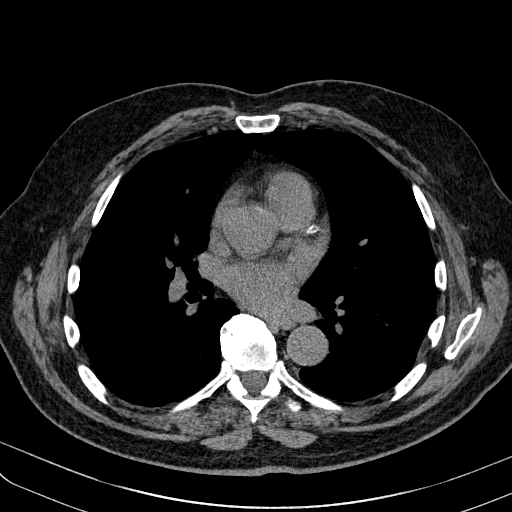
[im 87/164  lung]
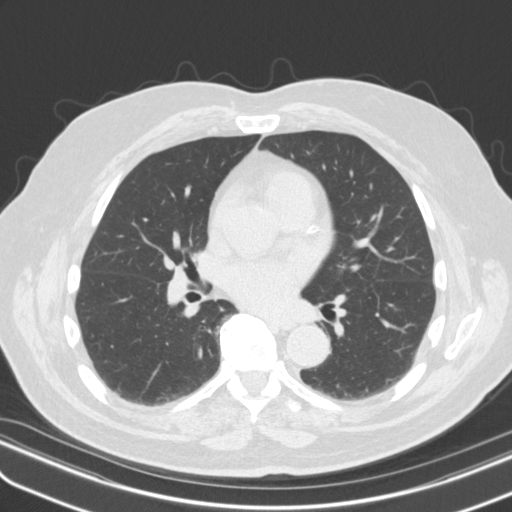
[im 97/164  lung]
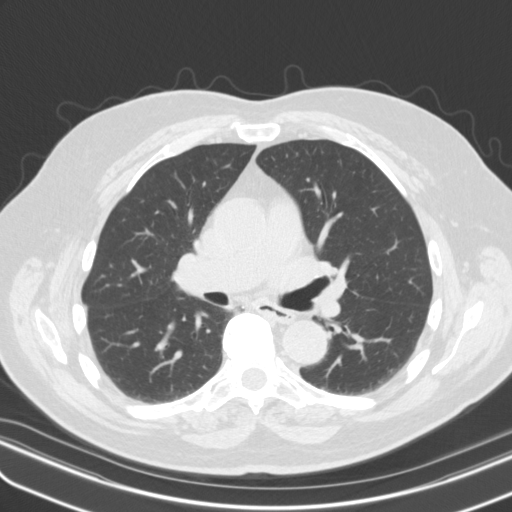
[im 103/164  lung]
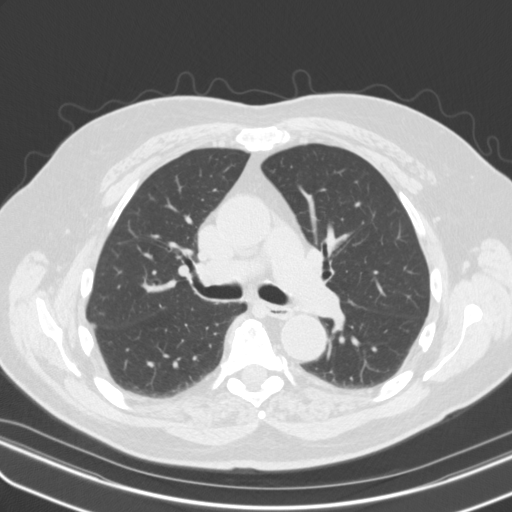
[im 115/164  lung]
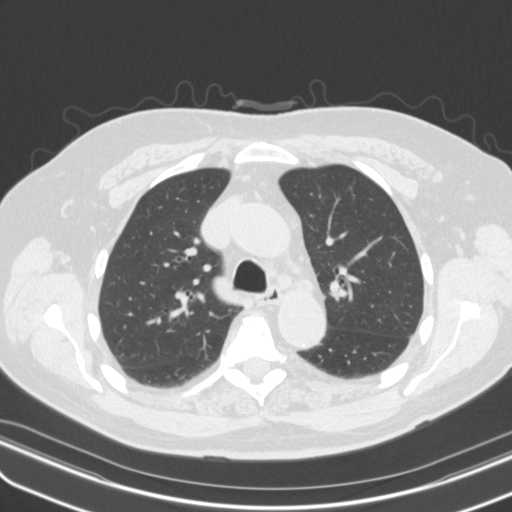
[im 127/164  mediastinal]
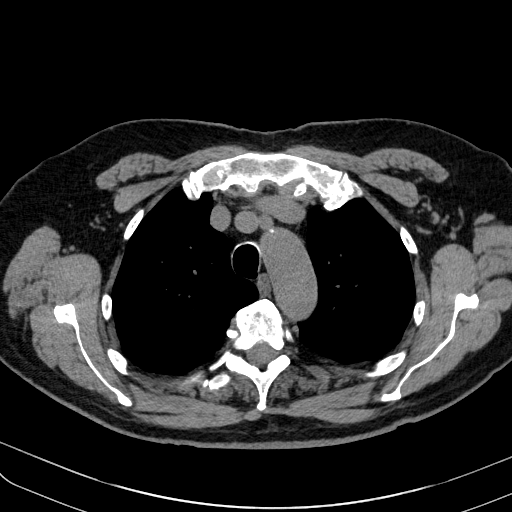
[im 127/164  lung]
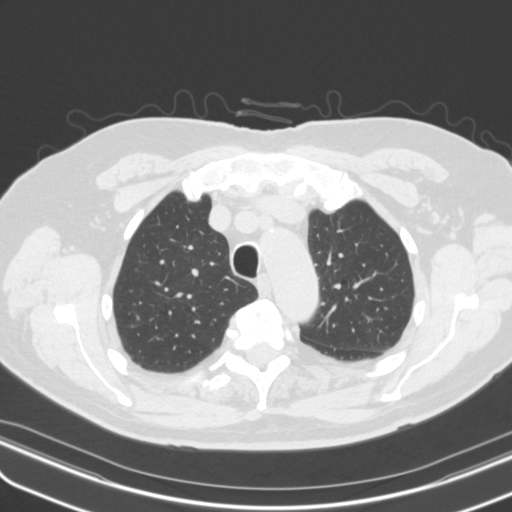
[im 133/164  lung]
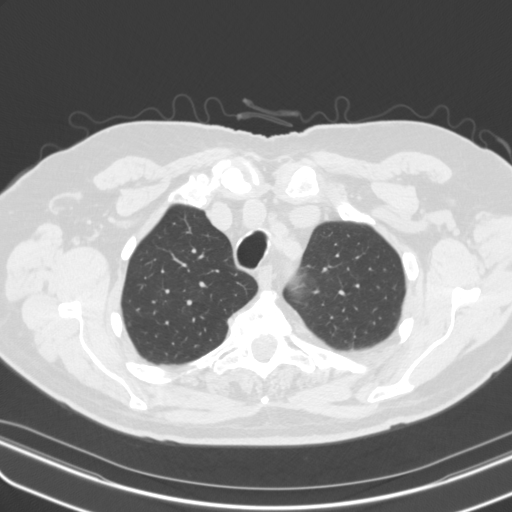
[im 145/164  lung]
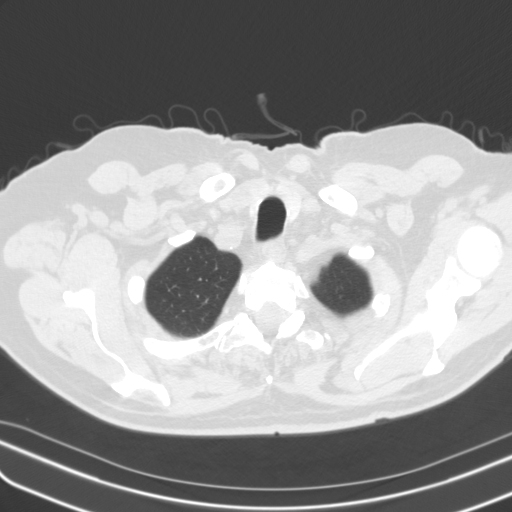
[im 157/164  lung]
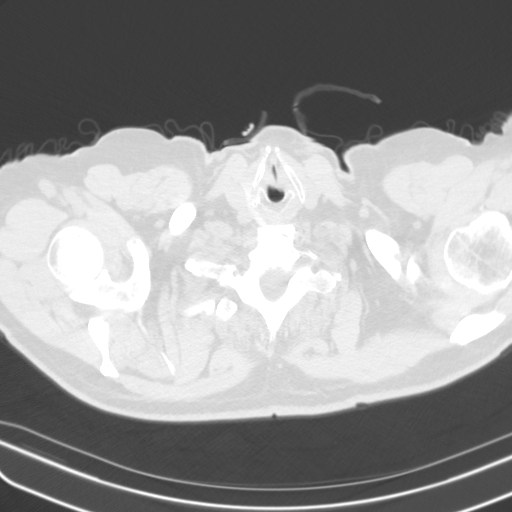

[16 of 34 positions shown; findings below may reference images not displayed]

FINDINGS: Cardiovascular: Normal heart size. No pericardial effusion. Coronary
artery calcification. Minor thoracic aortic calcification.

Mediastinum/Nodes: No enlarged lymph nodes. Thyroid is unremarkable.
Esophagus is unremarkable.

Lungs/Pleura: 7 mm superior segment left lower lobe nodule on series
3, image 72 measures similar in size (within 1 mm). No new nodule.
No pleural effusion.

Upper Abdomen: No acute abnormality. Anaid Cheng cysts are again
noted. Cholelithiasis.

Musculoskeletal: No acute osseous abnormality.
IMPRESSION: No significant change in left lower lobe nodule. Future CT at 18-24
months from initial study of 10/17/2020 is recommended for high-risk
patients but optional for low-risk patients.

Cholelithiasis.

Aortic Atherosclerosis ([id number]-KP6.6).

## 2021-10-16 DIAGNOSIS — M6281 Muscle weakness (generalized): Secondary | ICD-10-CM | POA: Diagnosis not present

## 2021-10-20 ENCOUNTER — Encounter: Payer: Self-pay | Admitting: Family Medicine

## 2021-10-20 DIAGNOSIS — Z85828 Personal history of other malignant neoplasm of skin: Secondary | ICD-10-CM | POA: Diagnosis not present

## 2021-10-20 DIAGNOSIS — L570 Actinic keratosis: Secondary | ICD-10-CM | POA: Diagnosis not present

## 2021-10-20 DIAGNOSIS — C44319 Basal cell carcinoma of skin of other parts of face: Secondary | ICD-10-CM | POA: Diagnosis not present

## 2021-10-20 DIAGNOSIS — C44311 Basal cell carcinoma of skin of nose: Secondary | ICD-10-CM | POA: Diagnosis not present

## 2021-10-20 DIAGNOSIS — L814 Other melanin hyperpigmentation: Secondary | ICD-10-CM | POA: Diagnosis not present

## 2021-10-20 DIAGNOSIS — L821 Other seborrheic keratosis: Secondary | ICD-10-CM | POA: Diagnosis not present

## 2021-10-20 DIAGNOSIS — L820 Inflamed seborrheic keratosis: Secondary | ICD-10-CM | POA: Diagnosis not present

## 2021-10-21 DIAGNOSIS — M6281 Muscle weakness (generalized): Secondary | ICD-10-CM | POA: Diagnosis not present

## 2021-10-27 DIAGNOSIS — M6281 Muscle weakness (generalized): Secondary | ICD-10-CM | POA: Diagnosis not present

## 2022-01-11 ENCOUNTER — Ambulatory Visit: Payer: Medicare Other | Admitting: Family Medicine

## 2022-01-19 DIAGNOSIS — M19031 Primary osteoarthritis, right wrist: Secondary | ICD-10-CM | POA: Diagnosis not present

## 2022-01-19 DIAGNOSIS — R600 Localized edema: Secondary | ICD-10-CM | POA: Diagnosis not present

## 2022-01-19 DIAGNOSIS — M25531 Pain in right wrist: Secondary | ICD-10-CM | POA: Diagnosis not present

## 2022-01-19 DIAGNOSIS — M19041 Primary osteoarthritis, right hand: Secondary | ICD-10-CM | POA: Diagnosis not present

## 2022-01-20 DIAGNOSIS — M109 Gout, unspecified: Secondary | ICD-10-CM | POA: Diagnosis not present

## 2022-02-08 NOTE — Progress Notes (Signed)
? ?Phone [phone] ?In person visit ?  ?Subjective:  ? ?Mitchell Walker is a 88 y.o. year old very pleasant male patient who presents for/with See problem oriented charting ?Chief Complaint  ?Patient presents with  ? Follow-up  ? Hypertension  ? Diabetes  ? Hyperlipidemia  ? Cough  ?  Dry cough due to spot on lung that happens more at night.  ? ? ?This visit occurred during the SARS-CoV-2 public health emergency.  Safety protocols were in place, including screening questions prior to the visit, additional usage of staff PPE, and extensive cleaning of exam room while observing appropriate contact time as indicated for disinfecting solutions.  ? ?Past Medical History-  ?Patient Active Problem List  ? Diagnosis Date Noted  ? Advanced directives, counseling/discussion 05/20/2017  ?  Priority: High  ? Diabetes mellitus ([place]) 05/20/2017  ?  Priority: High  ? B12 deficiency 05/02/2017  ?  Priority: High  ? MCI (mild cognitive impairment) 10/22/2016  ?  Priority: High  ? Aortic atherosclerosis ([place]) 10/20/2020  ?  Priority: Medium   ? Emphysema lung ([place]) 10/01/2020  ?  Priority: Medium   ? Glaucoma 12/27/2017  ?  Priority: Medium   ? Hyperlipidemia associated with type 2 diabetes mellitus ([place]) 02/03/2009  ?  Priority: Medium   ? Hypertension associated with diabetes ([place]) 07/17/2007  ?  Priority: Medium   ? Former smoker 05/21/2015  ?  Priority: Low  ? Adenomatous colon polyp 05/21/2015  ?  Priority: Low  ? Chronic frontal sinusitis 12/30/2010  ?  Priority: Low  ? ACNE ROSACEA 04/15/2010  ?  Priority: Low  ? Actinic keratosis 02/03/2009  ?  Priority: Low  ? Basal cell carcinoma of skin 02/29/2008  ?  Priority: Low  ? ERECTILE DYSFUNCTION 12/11/2007  ?  Priority: Low  ? Osteoarthritis 07/17/2007  ?  Priority: Low  ? ? ?Medications- reviewed and updated ?Current Outpatient Medications  ?Medication Sig Dispense Refill  ? albuterol (PROAIR HFA) 108 (90 Base) MCG/ACT inhaler Inhale 2 puffs into the lungs every 6 (six) hours  as needed for wheezing or shortness of breath (or cough). 1 each 2  ? amLODipine (NORVASC) 10 MG tablet Take 1 tablet (10 mg total) by mouth daily. 90 tablet 3  ? blood glucose meter kit and supplies KIT Dispense based on patient and insurance preference. Use up to four times daily as directed. 1 each 0  ? Cholecalciferol (VITAMIN D-3) 125 MCG (5000 UT) TABS Take 1 tablet by mouth daily.    ? ezetimibe (ZETIA) 10 MG tablet Take 1 tablet (10 mg total) by mouth daily. 90 tablet 3  ? Omega-3 Fatty Acids (FISH OIL) 1000 MG CAPS Take 1 capsule by mouth daily.    ? valsartan (DIOVAN) 80 MG tablet Take 1 tablet (80 mg total) by mouth daily. 90 tablet 3  ? vitamin B-12 (CYANOCOBALAMIN) 1000 MCG tablet Take 1,000 mcg by mouth daily.    ? ?No current facility-administered medications for this visit.  ? ?  ?Objective:  ?BP 130/62   Pulse 73   Temp 98.2 ?F (36.8 ?C)   Ht 5' 8"  (1.727 m)   Wt 186 lb 12.8 oz (84.7 kg)   SpO2 95%   BMI 28.40 kg/m?  ?Gen: NAD, resting comfortably ?CV: RRR no murmurs rubs or gallops ?Lungs: CTAB no crackles, wheeze, rhonchi ?Ext: no edema ?Skin: warm, dry ?  ? ?Assessment and Plan  ? ?# Mild cognitive impairment-follows with Dr. Posey Pronto - not  on any medication  ? ?#hypertension ?S: compliant with Amlodipine 10 mg and Benazepril 15m ?-Does get a dry cough more at night- got worse in winter months (humidifier may help)-we discussed potentially related benazepril  ?-off hctz with electrolyte issues ?BP Readings from Last 3 Encounters:  ?02/12/22 130/62  ?09/30/21 130/62  ?03/31/21 124/66  ?A/P: blood pressure is well controlled but he is having a dry cough at night which could be caused by benazepril-we are going to stop benazepril 20 mg and substitute it with valsartan 80 mg.  He will monitor blood pressure at home weekly and let me know in a few weeks if he is running above 135/85 at home.  If he is we will go up to 160 mg of valsartan.  Continue amlodipine 10 mg ? ?# Diabetes ?S: diet  controlled typically with significant increase at last visit. No medication.  ?CBGs- does not have meter ?Exercise and diet- had a hard time cutting down on sugars. Is walking when weather is ok ?Lab Results  ?Component Value Date  ? HGBA1C 7.5 (H) 09/30/2021  ? HGBA1C 6.8 (H) 03/31/2021  ? HGBA1C 6.3 04/28/2020  ? A/P: plan was to work on diet but has not had the most success- we will update a1c and may need to start medicine like metformin ER- due to tendency towards GI issues ?-already on b12 ? ?#hyperlipidemia/aortic atherosclerosis- incidental finding but wants to remain off statin ?#Coronary calcium-noted on CT chest ?S: patient takes Omega-3. Benefit of primary prevention at his age and with memory loss is unclear- will hold off on statin per her preference ?Lab Results  ?Component Value Date  ? CHOL 169 03/31/2021  ? HDL 47.80 03/31/2021  ? LDLCALC 104 (H) 03/31/2021  ? LDLDIRECT 127.3 02/11/2010  ? TRIG 82.0 03/31/2021  ? CHOLHDL 4 03/31/2021  ? A/P: despite potential risks- they are also concerned about risks of medicine- prefer to remain off statin and focus on plant based diet- though he struggles with this. Making ice cream dessert be fruit instead would be a big step.  ?- is willing to trial zetia 10 mg- start this and then check cholesterol at 4 month visit ? ?#Lung nodule-last completed 06/24/2021 with original scan 10/17/2020 with plans for follow-up 18 to 24 months after initial study-we opted to order this in 6 months though technically would consider him low risk due to how long ago smoking ?-if cough not improving within a few months they will reach out and we will order updated CT scan in may  ? ?#Glaucoma- no issues since laser treatment for narrow angle glaucoma ? ?Recommended follow up: Return in about 4 months (around 06/14/2022) for follow up- or sooner if needed. ?Future Appointments  ?Date Time Provider [place] ?03/01/2022 10:30 AM PNarda AmberK, DO [facility] None  ?04/06/2022  11:20 AM HYong Channel SBrayton Mars MD [facility]  ? ?Lab/Order associations: ?  ICD-10-CM   ?1. Hyperlipidemia associated with type 2 diabetes mellitus ([place]  E11.69   ? E78.5   ?  ?2. Essential hypertension  I10   ?  ?3. Type 2 diabetes mellitus without complication, without long-term current use of insulin (HCC)  E11.9 CBC with Differential/Platelet  ?  Comprehensive metabolic panel  ?  Hemoglobin A1c  ?  ?4. MCI (mild cognitive impairment)  G31.84   ?  ?5. Aortic atherosclerosis (HCC)  I70.0   ?  ? ? ?Meds ordered this encounter  ?Medications  ? valsartan (DIOVAN) 80 MG tablet  ?  Sig: Take 1 tablet (80 mg total) by mouth daily.  ?  Dispense:  90 tablet  ?  Refill:  3  ? blood glucose meter kit and supplies KIT  ?  Sig: Dispense based on patient and insurance preference. Use up to four times daily as directed.  ?  Dispense:  1 each  ?  Refill:  0  ?  Order Specific Question:   Number of strips  ?  Answer:   100  ?  Order Specific Question:   Number of lancets  ?  Answer:   100  ? ezetimibe (ZETIA) 10 MG tablet  ?  Sig: Take 1 tablet (10 mg total) by mouth daily.  ?  Dispense:  90 tablet  ?  Refill:  3  ? ? ?Return precautions advised.  ?Garret Reddish, MD ? ? ?

## 2022-02-12 ENCOUNTER — Ambulatory Visit (INDEPENDENT_AMBULATORY_CARE_PROVIDER_SITE_OTHER): Payer: Medicare Other | Admitting: Family Medicine

## 2022-02-12 ENCOUNTER — Encounter: Payer: Self-pay | Admitting: Family Medicine

## 2022-02-12 ENCOUNTER — Other Ambulatory Visit: Payer: Self-pay

## 2022-02-12 VITALS — BP 130/62 | HR 73 | Temp 98.2°F | Ht 68.0 in | Wt 186.8 lb

## 2022-02-12 DIAGNOSIS — E1169 Type 2 diabetes mellitus with other specified complication: Secondary | ICD-10-CM

## 2022-02-12 DIAGNOSIS — G3184 Mild cognitive impairment, so stated: Secondary | ICD-10-CM | POA: Diagnosis not present

## 2022-02-12 DIAGNOSIS — E785 Hyperlipidemia, unspecified: Secondary | ICD-10-CM | POA: Diagnosis not present

## 2022-02-12 DIAGNOSIS — E119 Type 2 diabetes mellitus without complications: Secondary | ICD-10-CM

## 2022-02-12 DIAGNOSIS — I10 Essential (primary) hypertension: Secondary | ICD-10-CM

## 2022-02-12 DIAGNOSIS — H409 Unspecified glaucoma: Secondary | ICD-10-CM

## 2022-02-12 DIAGNOSIS — I700 Atherosclerosis of aorta: Secondary | ICD-10-CM

## 2022-02-12 LAB — CBC WITH DIFFERENTIAL/PLATELET
Basophils Absolute: 0 10*3/uL (ref 0.0–0.1)
Basophils Relative: 0.7 % (ref 0.0–3.0)
Eosinophils Absolute: 0.2 10*3/uL (ref 0.0–0.7)
Eosinophils Relative: 4.1 % (ref 0.0–5.0)
HCT: 39 % (ref 39.0–52.0)
Hemoglobin: 13.1 g/dL (ref 13.0–17.0)
Lymphocytes Relative: 29.1 % (ref 12.0–46.0)
Lymphs Abs: 1.7 10*3/uL (ref 0.7–4.0)
MCHC: 33.6 g/dL (ref 30.0–36.0)
MCV: 84.6 fl (ref 78.0–100.0)
Monocytes Absolute: 0.6 10*3/uL (ref 0.1–1.0)
Monocytes Relative: 9.4 % (ref 3.0–12.0)
Neutro Abs: 3.4 10*3/uL (ref 1.4–7.7)
Neutrophils Relative %: 56.7 % (ref 43.0–77.0)
Platelets: 237 10*3/uL (ref 150.0–400.0)
RBC: 4.61 Mil/uL (ref 4.22–5.81)
RDW: 14 % (ref 11.5–15.5)
WBC: 6 10*3/uL (ref 4.0–10.5)

## 2022-02-12 LAB — COMPREHENSIVE METABOLIC PANEL
ALT: 14 U/L (ref 0–53)
AST: 17 U/L (ref 0–37)
Albumin: 3.9 g/dL (ref 3.5–5.2)
Alkaline Phosphatase: 75 U/L (ref 39–117)
BUN: 12 mg/dL (ref 6–23)
CO2: 28 mEq/L (ref 19–32)
Calcium: 8.9 mg/dL (ref 8.4–10.5)
Chloride: 100 mEq/L (ref 96–112)
Creatinine, Ser: 0.95 mg/dL (ref 0.40–1.50)
GFR: 72.95 mL/min (ref >60.00)
Glucose, Bld: 142 mg/dL — ABNORMAL HIGH (ref 70–99)
Potassium: 4 mEq/L (ref 3.5–5.1)
Sodium: 137 mEq/L (ref 135–145)
Total Bilirubin: 1 mg/dL (ref 0.2–1.2)
Total Protein: 6.8 g/dL (ref 6.0–8.3)

## 2022-02-12 LAB — HEMOGLOBIN A1C: Hgb A1c MFr Bld: 7.9 % — ABNORMAL HIGH (ref 4.6–6.5)

## 2022-02-12 MED ORDER — VALSARTAN 80 MG PO TABS: 80.0000 mg | ORAL_TABLET | Freq: Every day | ORAL | 3 refills | Status: DC

## 2022-02-12 MED ORDER — EZETIMIBE 10 MG PO TABS: 10.0000 mg | ORAL_TABLET | Freq: Every day | ORAL | 3 refills | Status: DC

## 2022-02-12 MED ORDER — BLOOD GLUCOSE MONITOR KIT: PACK | 0 refills | Status: DC

## 2022-02-12 NOTE — Patient Instructions (Addendum)
Send [place] the date of most recent COVID vaccine.  ? ?Let us know when you have your eye exam and have it faced to [place] at [phone]. ? ? blood pressure is well controlled but he is having a dry cough at night which could be caused by benazepril-we are going to stop benazepril 20 mg and substitute it with valsartan 80 mg.  He will monitor blood pressure at home weekly and let me know in a few weeks if he is running above 135/85 at home.  If he is we will go up to 160 mg of valsartan.  Continue amlodipine 10 mg ? ?if cough not improving within a few months they will reach out and we will order updated CT scan in may  ? ?Please stop by lab before you go ?If you have mychart- we will send your results within 3 business days of [place] receiving them.  ?If you do not have mychart- we will call you about results within 5 business days of [place] receiving them.  ?*please also note that you will see labs on mychart as soon as they post. I will later go in and write notes on them- will say "notes from Dr. Yong Channel"  ? ?Recommended follow up: Return in about 4 months (around 06/14/2022) for follow up- or sooner if needed.  ?

## 2022-02-15 ENCOUNTER — Telehealth: Payer: Self-pay | Admitting: Family Medicine

## 2022-02-15 MED ORDER — FREESTYLE LANCETS MISC
12 refills | Status: AC
Start: 2022-02-15 — End: ?

## 2022-02-15 MED ORDER — FREESTYLE LITE TEST VI STRP
ORAL_STRIP | 12 refills | Status: AC
Start: 2022-02-15 — End: ?

## 2022-02-15 MED ORDER — FREESTYLE LITE W/DEVICE KIT: 1.0000 | PACK | Freq: Every day | 3 refills | Status: AC

## 2022-02-15 NOTE — Telephone Encounter (Signed)
Rx sent to [facility] [address].  ?

## 2022-02-15 NOTE — Telephone Encounter (Signed)
Mitchell Walker [phone] called stated she received an RX for meter kit/ glucose kit. Stated this needs to go through his insurance so that he wont be billed for. Asked to be [facility] to [facility] [address] in [place].  ? ?Also stated [facility] and [facility]ist was not able to take her RX, it needs to come from Dr Ronney Lion office.  ? ?[facility] for test strips it cannot be combined with kit.   ? ?Mitchell Walker asked to please write specific instructions in kit.- pt is not aware when to test and how many times a day.  ?

## 2022-02-16 ENCOUNTER — Telehealth: Payer: Self-pay | Admitting: Family Medicine

## 2022-02-16 NOTE — Telephone Encounter (Signed)
Pharmacist [place] [phone] called in regards to patients metformin oral medication being sent over.  ?

## 2022-02-16 NOTE — Telephone Encounter (Signed)
Called and lm for [employer]. ?

## 2022-02-19 ENCOUNTER — Other Ambulatory Visit: Payer: Self-pay

## 2022-02-19 MED ORDER — METFORMIN HCL 500 MG PO TABS: 500.0000 mg | ORAL_TABLET | Freq: Every day | ORAL | 3 refills | Status: DC

## 2022-03-01 ENCOUNTER — Ambulatory Visit (INDEPENDENT_AMBULATORY_CARE_PROVIDER_SITE_OTHER): Payer: Medicare Other | Admitting: Neurology

## 2022-03-01 ENCOUNTER — Other Ambulatory Visit: Payer: Self-pay

## 2022-03-01 ENCOUNTER — Encounter: Payer: Self-pay | Admitting: Neurology

## 2022-03-01 VITALS — BP 146/79 | HR 63 | Ht 67.0 in | Wt 187.4 lb

## 2022-03-01 DIAGNOSIS — G3184 Mild cognitive impairment, so stated: Secondary | ICD-10-CM | POA: Diagnosis not present

## 2022-03-01 NOTE — Progress Notes (Signed)
? ? ?Follow-up Visit ? ? ?Date: 03/01/22 ? ? ?Mitchell Walker ?MRN: [id number] ?DOB: Nov 14, 1934 ? ? ?Interim History: ?CORDARRIUS COAD is a 88 y.o. right-handed Caucasian male returning to the clinic for follow-up of MCI.  The patient was accompanied to the clinic by wife who also provides collateral information.   ? ?UPDATE 03/01/2022:  He is here for 1 year visit.  Overall, there has been mild progression with short-term memory and tends to repeat questions and needs to be reminded of tasks/appointments.  He manages his own medications and continues to drive.  Wife does not have any safety concerns.  Mood has been good.  ? ?He will be started on medication for diabetes and cholesterol, which they attribute to good food. He continues to live at [facility], where they moved in 2022.  They are very happy with the move and engage regularly with social activities.  ? ? ?Medications:  ?Current Outpatient Medications on File Prior to Visit  ?Medication Sig Dispense Refill  ? albuterol (PROAIR HFA) 108 (90 Base) MCG/ACT inhaler Inhale 2 puffs into the lungs every 6 (six) hours as needed for wheezing or shortness of breath (or cough). 1 each 2  ? amLODipine (NORVASC) 10 MG tablet Take 1 tablet (10 mg total) by mouth daily. 90 tablet 3  ? blood glucose meter kit and supplies KIT Dispense based on patient and insurance preference. Use up to four times daily as directed. 1 each 0  ? Blood Glucose Monitoring Suppl (FREESTYLE LITE) w/Device KIT 1 kit by Does not apply route daily at 2 PM. Use to test blood sugars daily. Dx: E11.9 1 kit 3  ? Cholecalciferol (VITAMIN D-3) 125 MCG (5000 UT) TABS Take 1 tablet by mouth daily.    ? glucose blood (FREESTYLE LITE) test strip Use to test blood sugars daily. Dx: E11.9 100 each 12  ? Lancets (FREESTYLE) lancets Use to test blood sugars daily. Dx: E11.9 100 each 12  ? Omega-3 Fatty Acids (FISH OIL) 1000 MG CAPS Take 1 capsule by mouth daily.    ? vitamin B-12 (CYANOCOBALAMIN)  1000 MCG tablet Take 1,000 mcg by mouth daily.    ? ezetimibe (ZETIA) 10 MG tablet Take 1 tablet (10 mg total) by mouth daily. 90 tablet 3  ? metFORMIN (GLUCOPHAGE) 500 MG tablet Take 1 tablet (500 mg total) by mouth daily with breakfast. 90 tablet 3  ? valsartan (DIOVAN) 80 MG tablet Take 1 tablet (80 mg total) by mouth daily. 90 tablet 3  ? ?No current facility-administered medications on file prior to visit.  ? ? ?Allergies:  ?Allergies  ?Allergen Reactions  ? Celecoxib   ?  REACTION: Upset GI  ? Sulfonamide Derivatives   ?  REACTION: rash  ? ? ?Vital Signs:  ?BP (!) 146/79   Pulse 63   Ht _0  (1.702 m)   Wt 187 lb 6.4 oz (85 kg)   SpO2 95%   BMI 29.35 kg/m?  ?  ?Neurological Exam: ?MENTAL STATUS including orientation to time, place, person, recent and remote memory, attention span and concentration, language, and fund of knowledge is normal.  Speech is not dysarthric.  Oriented to year, month, day of the week, date was incorrect. Home address is correct. ? ?CRANIAL NERVES:  No visual field defects.  Pupils equal round and reactive to light.  Normal conjugate, extra-ocular eye movements in all directions of gaze.  No ptosis.   ? ?MOTOR:  Motor strength is 5/5 in  all extremities ? ?COORDINATION/GAIT:  Gait narrow based and stable.  ? ?Data: ?Labs 10/22/2016:  Vitamin B12 422, RPR neg, TSH 2.34, HIV NR ?  ?MRI brain wo contrast 04/20/2017: ?1. No acute intracranial abnormality. ?2. Moderate for age chronic microvascular ischemic changes and moderate diffuse parenchymal volume loss of the brain. ?3. Moderate diffuse paranasal sinus disease greatest in ethmoid and right sphenoid sinuses. ? ?Neuropsychological testing 06/23/2017:  Amnestic mild cognitive impairment ? ?IMPRESSION/PLAN: ?Mild cognitive impairment, amnestic.  Mild progression of short-term memory, however, he remains highly independent with all ADLs and IADLs.  He is very pleased with their move to [facility] independent living and is able to be more  engaged and active. ?Continue to monitor ? ?Return to clinic in 1 year ? ?Thank you for allowing me to participate in patient's care.  If I can answer any additional questions, I would be pleased to do so.   ? ?Sincerely, ? ? ? ?Lionardo Haze K. Posey Pronto, DO ? ? ?

## 2022-03-01 NOTE — Patient Instructions (Signed)
Return to clinic in 1 year.

## 2022-03-08 DIAGNOSIS — E119 Type 2 diabetes mellitus without complications: Secondary | ICD-10-CM | POA: Diagnosis not present

## 2022-03-08 DIAGNOSIS — Z961 Presence of intraocular lens: Secondary | ICD-10-CM | POA: Diagnosis not present

## 2022-03-08 LAB — HM DIABETES EYE EXAM

## 2022-03-11 ENCOUNTER — Encounter: Payer: Self-pay | Admitting: Family Medicine

## 2022-04-06 ENCOUNTER — Ambulatory Visit: Payer: Medicare Other | Admitting: Family Medicine

## 2022-05-05 DIAGNOSIS — Z85828 Personal history of other malignant neoplasm of skin: Secondary | ICD-10-CM | POA: Diagnosis not present

## 2022-05-05 DIAGNOSIS — D224 Melanocytic nevi of scalp and neck: Secondary | ICD-10-CM | POA: Diagnosis not present

## 2022-05-05 DIAGNOSIS — D485 Neoplasm of uncertain behavior of skin: Secondary | ICD-10-CM | POA: Diagnosis not present

## 2022-05-05 DIAGNOSIS — L814 Other melanin hyperpigmentation: Secondary | ICD-10-CM | POA: Diagnosis not present

## 2022-05-05 DIAGNOSIS — L820 Inflamed seborrheic keratosis: Secondary | ICD-10-CM | POA: Diagnosis not present

## 2022-05-05 DIAGNOSIS — L570 Actinic keratosis: Secondary | ICD-10-CM | POA: Diagnosis not present

## 2022-05-05 DIAGNOSIS — L821 Other seborrheic keratosis: Secondary | ICD-10-CM | POA: Diagnosis not present

## 2022-06-14 ENCOUNTER — Other Ambulatory Visit: Payer: Self-pay | Admitting: Family Medicine

## 2022-06-29 ENCOUNTER — Encounter: Payer: Self-pay | Admitting: Family Medicine

## 2022-06-29 ENCOUNTER — Ambulatory Visit (INDEPENDENT_AMBULATORY_CARE_PROVIDER_SITE_OTHER): Payer: Medicare Other | Admitting: Family Medicine

## 2022-06-29 VITALS — BP 120/69 | HR 67 | Temp 98.3°F | Ht 67.0 in | Wt 183.0 lb

## 2022-06-29 DIAGNOSIS — J439 Emphysema, unspecified: Secondary | ICD-10-CM

## 2022-06-29 DIAGNOSIS — E785 Hyperlipidemia, unspecified: Secondary | ICD-10-CM | POA: Diagnosis not present

## 2022-06-29 DIAGNOSIS — E119 Type 2 diabetes mellitus without complications: Secondary | ICD-10-CM

## 2022-06-29 DIAGNOSIS — E1169 Type 2 diabetes mellitus with other specified complication: Secondary | ICD-10-CM

## 2022-06-29 DIAGNOSIS — R911 Solitary pulmonary nodule: Secondary | ICD-10-CM

## 2022-06-29 DIAGNOSIS — E538 Deficiency of other specified B group vitamins: Secondary | ICD-10-CM | POA: Diagnosis not present

## 2022-06-29 NOTE — Progress Notes (Signed)
Phone [phone] In person visit   Subjective:   Mitchell Walker is a 88 y.o. year old very pleasant male patient who presents for/with See problem oriented charting Chief Complaint  Patient presents with   Follow-up   Diabetes   Hypertension    Past Medical History-  Patient Active Problem List   Diagnosis Date Noted   Advanced directives, counseling/discussion 05/20/2017    Priority: High   Diabetes mellitus ([place]) 05/20/2017    Priority: High   B12 deficiency 05/02/2017    Priority: High   MCI (mild cognitive impairment) 10/22/2016    Priority: High   Aortic atherosclerosis ([place]) 10/20/2020    Priority: Medium    Emphysema lung ([place]) 10/01/2020    Priority: Medium    Glaucoma 12/27/2017    Priority: Medium    Hyperlipidemia associated with type 2 diabetes mellitus ([place]) 02/03/2009    Priority: Medium    Hypertension associated with diabetes ([place]) 07/17/2007    Priority: Medium    Former smoker 05/21/2015    Priority: Low   Adenomatous colon polyp 05/21/2015    Priority: Low   Chronic frontal sinusitis 12/30/2010    Priority: Low   ACNE ROSACEA 04/15/2010    Priority: Low   Actinic keratosis 02/03/2009    Priority: Low   Basal cell carcinoma of skin 02/29/2008    Priority: Low   ERECTILE DYSFUNCTION 12/11/2007    Priority: Low   Osteoarthritis 07/17/2007    Priority: Low    Medications- reviewed and updated Current Outpatient Medications  Medication Sig Dispense Refill   albuterol (PROAIR HFA) 108 (90 Base) MCG/ACT inhaler Inhale 2 puffs into the lungs every 6 (six) hours as needed for wheezing or shortness of breath (or cough). 1 each 2   amLODipine (NORVASC) 10 MG tablet Take 1 tablet (10 mg total) by mouth daily. 90 tablet 3   Blood Glucose Monitoring Suppl (FREESTYLE LITE) w/Device KIT 1 kit by Does not apply route daily at 2 PM. Use to test blood sugars daily. Dx: E11.9 1 kit 3   Cholecalciferol (VITAMIN D-3) 125 MCG (5000 UT) TABS Take 1 tablet by  mouth daily.     ezetimibe (ZETIA) 10 MG tablet Take 1 tablet (10 mg total) by mouth daily. 90 tablet 3   glucose blood (FREESTYLE LITE) test strip Use to test blood sugars daily. Dx: E11.9 100 each 12   Lancets (FREESTYLE) lancets Use to test blood sugars daily. Dx: E11.9 100 each 12   metFORMIN (GLUCOPHAGE) 500 MG tablet TAKE 1 TABLET BY MOUTH ONCE DAILY WITH BREAKFAST *TAKE WITH FOOD* 0.9 tablet 2   valsartan (DIOVAN) 80 MG tablet Take 1 tablet (80 mg total) by mouth daily. 90 tablet 3   vitamin B-12 (CYANOCOBALAMIN) 1000 MCG tablet Take 1,000 mcg by mouth daily.     No current facility-administered medications for this visit.     Objective:  BP 120/69 Comment: most recent home reading by nursing at [facility]  Pulse 67   Temp 98.3 F (36.8 C)   Ht 5' 7"  (1.702 m)   Wt 183 lb (83 kg)   SpO2 95%   BMI 28.66 kg/m  Gen: NAD, resting comfortably CV: RRR no murmurs rubs or gallops Lungs: CTAB no crackles, wheeze, rhonchi Abdomen: soft/nontender/nondistended/normal bowel sounds.  Ext: trace edema Skin: warm, dry    Assessment and Plan   % Mild cognitive impairment-follows with Dr. Posey Pronto - stable visit in march  #Glaucoma- no issues since laser treatment for narrow  angle glaucoma  #hypertension S: compliant with Amlodipine 10 mg and Benazepril 11m previously now on valsartan 80 mg - cough much improved off benazepril -off hctz with electrolyte issues BP Readings from Last 3 Encounters:  06/29/22 120/69  03/01/22 (!) 146/79  02/12/22 130/62  A/P: Controlled. Continue current medications.   # Diabetes S: Medication: Metformin 500 mg - misses about 50% of doses CBGs- highest sugar was 176 am after ice cream otherwise mostly 90s to 110s with a few outliers and no lows Exercise and diet- cut back on desserts other than no  sugar added ice cream- has cut back on other options and frequency Lab Results  Component Value Date   HGBA1C 7.9 (H) 02/12/2022   HGBA1C 7.5 (H)  09/30/2021   HGBA1C 6.8 (H) 03/31/2021   A/P: With dietary changes and adding metformin 500 mg about half the days-hopeful for A1c improvement-we will check this with blood work today  #hyperlipidemia/aortic atherosclerosis- incidental finding but wants to remain off statin #Coronary calcium-noted on CT chest S: We did add zetia in light of aortic atherosclerosis but wanting to be cautious about statin due to memory concerns Lab Results  Component Value Date   CHOL 169 03/31/2021   HDL 47.80 03/31/2021   LDLCALC 104 (H) 03/31/2021   LDLDIRECT 127.3 02/11/2010   TRIG 82.0 03/31/2021   CHOLHDL 4 03/31/2021  A/P: hopeful for LDL improvement- ideally under 70 with plaque on aorta as well as coronary artery calcifications.    # B12 Deficiency S: He remains on daily supplements 1000 mcg Lab Results  Component Value Date   [id number] 229(H) 03/31/2021  A/P: hopefully stable- update b12 today. Continue current meds for now  #Emphysema-incidental finding on chest x-ray October 2021-trial of albuterol for chronic coughversus caused by benazepril which has since been stopped- has not really needed albuterol- doing well overall  #Lung nodule-last completed 06/24/2021 with original scan 10/17/2020 with plans for follow-up 18 to 24 months after initial study-past 18 months- now ordered  Recommended follow up: Return for next already scheduled visit or sooner if needed. Future Appointments  Date Time Provider [place] 10/21/2022  1:20 PM HMarin Olp MD [facility]  03/07/2023 10:30 AM PNarda AmberK, DO [facility] None    Lab/Order associations: Not fasting   ICD-10-CM   1. Type 2 diabetes mellitus without complication, without long-term current use of insulin (HCC)  E11.9 CBC with Differential/Platelet    Comprehensive metabolic panel    Lipid panel    Hemoglobin A1c    Microalbumin / creatinine urine ratio    2. Hyperlipidemia associated with type 2 diabetes mellitus (HCC)   E11.69 Lipid panel   E78.5     3. Pulmonary emphysema, unspecified emphysema type ([place]  J43.9     4. B12 deficiency  E53.8 Vitamin B12    5. Lung nodule  R91.1 CT Chest Wo Contrast     No orders of the defined types were placed in this encounter.  Return precautions advised.  SGarret Reddish MD

## 2022-06-29 NOTE — Patient Instructions (Addendum)
Please stop by lab before you go If you have mychart- we will send your results within 3 business days of [place] receiving them.  If you do not have mychart- we will call you about results within 5 business days of [place] receiving them.  *please also note that you will see labs on mychart as soon as they post. I will later go in and write notes on them- will say "notes from Dr. Yong Channel"   No changes for now  Recommended follow up: Return for next already scheduled visit or sooner if needed.

## 2022-06-30 LAB — COMPREHENSIVE METABOLIC PANEL
ALT: 16 U/L (ref 0–53)
AST: 19 U/L (ref 0–37)
Albumin: 4.2 g/dL (ref 3.5–5.2)
Alkaline Phosphatase: 80 U/L (ref 39–117)
BUN: 18 mg/dL (ref 6–23)
CO2: 25 mEq/L (ref 19–32)
Calcium: 8.6 mg/dL (ref 8.4–10.5)
Chloride: 102 mEq/L (ref 96–112)
Creatinine, Ser: 1.04 mg/dL (ref 0.40–1.50)
GFR: 65.27 mL/min (ref >60.00)
Glucose, Bld: 194 mg/dL — ABNORMAL HIGH (ref 70–99)
Potassium: 3.7 mEq/L (ref 3.5–5.1)
Sodium: 135 mEq/L (ref 135–145)
Total Bilirubin: 0.6 mg/dL (ref 0.2–1.2)
Total Protein: 6.7 g/dL (ref 6.0–8.3)

## 2022-06-30 LAB — LIPID PANEL
Cholesterol: 168 mg/dL (ref 0–200)
HDL: 39.6 mg/dL (ref >39.00)
NonHDL: 128.3
Total CHOL/HDL Ratio: 4
Triglycerides: 230 mg/dL — ABNORMAL HIGH (ref 0.0–149.0)
VLDL: 46 mg/dL — ABNORMAL HIGH (ref 0.0–40.0)

## 2022-06-30 LAB — CBC WITH DIFFERENTIAL/PLATELET
Basophils Absolute: 0.1 10*3/uL (ref 0.0–0.1)
Basophils Relative: 1.1 % (ref 0.0–3.0)
Eosinophils Absolute: 0.2 10*3/uL (ref 0.0–0.7)
Eosinophils Relative: 3.4 % (ref 0.0–5.0)
HCT: 39.7 % (ref 39.0–52.0)
Hemoglobin: 13.4 g/dL (ref 13.0–17.0)
Lymphocytes Relative: 24.9 % (ref 12.0–46.0)
Lymphs Abs: 1.8 10*3/uL (ref 0.7–4.0)
MCHC: 33.7 g/dL (ref 30.0–36.0)
MCV: 85.9 fl (ref 78.0–100.0)
Monocytes Absolute: 0.5 10*3/uL (ref 0.1–1.0)
Monocytes Relative: 7.7 % (ref 3.0–12.0)
Neutro Abs: 4.4 10*3/uL (ref 1.4–7.7)
Neutrophils Relative %: 62.9 % (ref 43.0–77.0)
Platelets: 204 10*3/uL (ref 150.0–400.0)
RBC: 4.62 Mil/uL (ref 4.22–5.81)
RDW: 14 % (ref 11.5–15.5)
WBC: 7 10*3/uL (ref 4.0–10.5)

## 2022-06-30 LAB — MICROALBUMIN / CREATININE URINE RATIO
Creatinine,U: 79.2 mg/dL
Microalb Creat Ratio: 3 mg/g (ref 0.0–30.0)
Microalb, Ur: 2.4 mg/dL — ABNORMAL HIGH (ref 0.0–1.9)

## 2022-06-30 LAB — VITAMIN B12: Vitamin B-12: 946 pg/mL — ABNORMAL HIGH (ref 211–911)

## 2022-06-30 LAB — LDL CHOLESTEROL, DIRECT: Direct LDL: 102 mg/dL

## 2022-06-30 LAB — HEMOGLOBIN A1C: Hgb A1c MFr Bld: 6.6 % — ABNORMAL HIGH (ref 4.6–6.5)

## 2022-07-26 ENCOUNTER — Other Ambulatory Visit: Payer: Self-pay | Admitting: Family Medicine

## 2022-07-29 ENCOUNTER — Ambulatory Visit
Admission: RE | Admit: 2022-07-29 | Discharge: 2022-07-29 | Disposition: A | Discharge status: Home / Self Care | Payer: Medicare Other | Source: Ambulatory Visit | Attending: Family Medicine | Admitting: Family Medicine

## 2022-07-29 DIAGNOSIS — R911 Solitary pulmonary nodule: Secondary | ICD-10-CM | POA: Diagnosis not present

## 2022-07-29 DIAGNOSIS — I700 Atherosclerosis of aorta: Secondary | ICD-10-CM | POA: Diagnosis not present

## 2022-09-06 ENCOUNTER — Encounter: Payer: Self-pay | Admitting: *Deleted

## 2022-09-17 DIAGNOSIS — Z23 Encounter for immunization: Secondary | ICD-10-CM | POA: Diagnosis not present

## 2022-10-21 ENCOUNTER — Ambulatory Visit (INDEPENDENT_AMBULATORY_CARE_PROVIDER_SITE_OTHER): Payer: Medicare Other | Admitting: Family Medicine

## 2022-10-21 ENCOUNTER — Encounter: Payer: Self-pay | Admitting: Family Medicine

## 2022-10-21 VITALS — BP 130/62 | HR 69 | Temp 98.4°F | Ht 67.0 in | Wt 183.0 lb

## 2022-10-21 DIAGNOSIS — E1169 Type 2 diabetes mellitus with other specified complication: Secondary | ICD-10-CM

## 2022-10-21 DIAGNOSIS — I10 Essential (primary) hypertension: Secondary | ICD-10-CM | POA: Diagnosis not present

## 2022-10-21 DIAGNOSIS — E119 Type 2 diabetes mellitus without complications: Secondary | ICD-10-CM | POA: Diagnosis not present

## 2022-10-21 DIAGNOSIS — E785 Hyperlipidemia, unspecified: Secondary | ICD-10-CM | POA: Diagnosis not present

## 2022-10-21 LAB — COMPREHENSIVE METABOLIC PANEL
ALT: 17 U/L (ref 0–53)
AST: 19 U/L (ref 0–37)
Albumin: 4 g/dL (ref 3.5–5.2)
Alkaline Phosphatase: 75 U/L (ref 39–117)
BUN: 17 mg/dL (ref 6–23)
CO2: 32 mEq/L (ref 19–32)
Calcium: 8.9 mg/dL (ref 8.4–10.5)
Chloride: 102 mEq/L (ref 96–112)
Creatinine, Ser: 1.06 mg/dL (ref 0.40–1.50)
GFR: 63.66 mL/min (ref >60.00)
Glucose, Bld: 140 mg/dL — ABNORMAL HIGH (ref 70–99)
Potassium: 3.9 mEq/L (ref 3.5–5.1)
Sodium: 139 mEq/L (ref 135–145)
Total Bilirubin: 0.8 mg/dL (ref 0.2–1.2)
Total Protein: 6.6 g/dL (ref 6.0–8.3)

## 2022-10-21 LAB — HEMOGLOBIN A1C: Hgb A1c MFr Bld: 6.4 % (ref 4.6–6.5)

## 2022-10-21 MED ORDER — RSVPREF3 VAC RECOMB ADJUVANTED 120 MCG/0.5ML IM SUSR: 0.5000 mL | Freq: Once | INTRAMUSCULAR | 0 refills | Status: AC

## 2022-10-21 NOTE — Patient Instructions (Addendum)
Health Maintenance Due  Topic Date Due   Medicare Annual Wellness (AWV)  06/30/2021  You are eligible to schedule your annual wellness visit with our nurse specialist Otila Kluver.  Please consider scheduling this before you leave today  Please stop by lab before you go If you have mychart- we will send your results within 3 business days of [place] receiving them.  If you do not have mychart- we will call you about results within 5 business days of [place] receiving them.  *please also note that you will see labs on mychart as soon as they post. I will later go in and write notes on them- will say "notes from Dr. Yong Channel"   Get rsv at pharmacy and let us know  Recommended follow up: Return in about 6 months (around 04/21/2023) for followup or sooner if needed.Schedule b4 you leave.

## 2022-10-21 NOTE — Progress Notes (Signed)
Phone [phone] In person visit   Subjective:   Mitchell Walker is a 88 y.o. year old very pleasant male patient who presents for/with See problem oriented charting Chief Complaint  Patient presents with   Follow-up   Diabetes   Hypertension   Colonoscopy    Pt states he does not wish to do any more.   Past Medical History-  Patient Active Problem List   Diagnosis Date Noted   Advanced directives, counseling/discussion 05/20/2017    Priority: High   Diabetes mellitus ([place]) 05/20/2017    Priority: High   B12 deficiency 05/02/2017    Priority: High   MCI (mild cognitive impairment) 10/22/2016    Priority: High   Aortic atherosclerosis ([place]) 10/20/2020    Priority: Medium    Emphysema lung ([place]) 10/01/2020    Priority: Medium    Glaucoma 12/27/2017    Priority: Medium    Hyperlipidemia associated with type 2 diabetes mellitus ([place]) 02/03/2009    Priority: Medium    Hypertension 07/17/2007    Priority: Medium    Former smoker 05/21/2015    Priority: Low   Adenomatous colon polyp 05/21/2015    Priority: Low   Chronic frontal sinusitis 12/30/2010    Priority: Low   ACNE ROSACEA 04/15/2010    Priority: Low   Actinic keratosis 02/03/2009    Priority: Low   Basal cell carcinoma of skin 02/29/2008    Priority: Low   ERECTILE DYSFUNCTION 12/11/2007    Priority: Low   Osteoarthritis 07/17/2007    Priority: Low    Medications- reviewed and updated Current Outpatient Medications  Medication Sig Dispense Refill   albuterol (PROAIR HFA) 108 (90 Base) MCG/ACT inhaler Inhale 2 puffs into the lungs every 6 (six) hours as needed for wheezing or shortness of breath (or cough). 1 each 2   amLODipine (NORVASC) 10 MG tablet TAKE 1 TABLET BY MOUTH ONCE DAILY 90 tablet 2   Blood Glucose Monitoring Suppl (FREESTYLE LITE) w/Device KIT 1 kit by Does not apply route daily at 2 PM. Use to test blood sugars daily. Dx: E11.9 1 kit 3   Cholecalciferol (VITAMIN D-3) 125 MCG (5000 UT) TABS  Take 1 tablet by mouth daily.     ezetimibe (ZETIA) 10 MG tablet Take 1 tablet (10 mg total) by mouth daily. 90 tablet 3   glucose blood (FREESTYLE LITE) test strip Use to test blood sugars daily. Dx: E11.9 100 each 12   Lancets (FREESTYLE) lancets Use to test blood sugars daily. Dx: E11.9 100 each 12   metFORMIN (GLUCOPHAGE) 500 MG tablet TAKE 1 TABLET BY MOUTH ONCE DAILY WITH BREAKFAST *TAKE WITH FOOD* 0.9 tablet 2   RSV vaccine recomb adjuvanted (AREXVY) 120 MCG/0.5ML injection Inject 0.5 mLs into the muscle once for 1 dose. 0.5 mL 0   valsartan (DIOVAN) 80 MG tablet Take 1 tablet (80 mg total) by mouth daily. 90 tablet 3   vitamin B-12 (CYANOCOBALAMIN) 1000 MCG tablet Take 1,000 mcg by mouth daily.     No current facility-administered medications for this visit.     Objective:  BP 130/62   Pulse 69   Temp 98.4 F (36.9 C)   Ht _0  (1.702 m)   Wt 183 lb (83 kg)   SpO2 95%   BMI 28.66 kg/m  Gen: NAD, resting comfortably CV: RRR no murmurs rubs or gallops Lungs: CTAB no crackles, wheeze, rhonchi Abdomen: soft/nontender/nondistended/normal bowel sounds. No rebound or guarding.  Ext: trace edema Skin: warm, dry  Assessment and Plan   % Mild cognitive impairment-follows with Dr. Posey Pronto -scheduled in march. Ongoing issues- some worsening  #hypertension S: compliant with Amlodipine 10 mg and valsartan 80 mg  -exercising 3x a week including chair volleyball BP Readings from Last 3 Encounters:  10/21/22 130/62  06/29/22 120/69  03/01/22 (!) 146/79   A/P: Controlled. Continue current medications.   # Diabetes S: Medication: Metformin 500 mg Exercising as above. Reasonably healthy diet- stable weight. Ice cream 2 days a week- no sugar added  -110s fasting Lab Results  Component Value Date   HGBA1C 6.6 (H) 06/29/2022   HGBA1C 7.9 (H) 02/12/2022   HGBA1C 7.5 (H) 09/30/2021   A/P: hopefully stable- update a1c today. Continue current meds for now   #hyperlipidemia/aortic  atherosclerosis/coronary artery calcifications- incidental finding but wants to remain off statin #Coronary calcium-noted on CT chest S: We did add zetia in light of aortic atherosclerosis but wanting to be cautious about statin due to memory concerns Lab Results  Component Value Date   CHOL 168 06/29/2022   HDL 39.60 06/29/2022   LDLCALC 104 (H) 03/31/2021   LDLDIRECT 102.0 06/29/2022   TRIG 230.0 (H) 06/29/2022   CHOLHDL 4 06/29/2022   A/P: lipids above goal considering aortic atherosclerosis (presumed stable) but he prefers to remain off medication   # B12 Deficiency S: He remains on daily supplements 1000 mcg Lab Results  Component Value Date   VITAMINB12 946 (H) 06/29/2022   A/P: Controlled. Continue current medications.   #History of precancerous colon polyp 08/03/2018-patient is aware of potential colon cancer risk but declines further colonoscopies- would have some sedation risk with memory/age   Recommended follow up: Return in about 6 months (around 04/21/2023) for followup or sooner if needed.Schedule b4 you leave. Future Appointments  Date Time Provider [place]  03/07/2023 10:30 AM Narda Amber K, DO [facility] None   Lab/Order associations:   ICD-10-CM   1. Primary hypertension  I10     2. Type 2 diabetes mellitus without complication, without long-term current use of insulin (HCC)  E11.9 Comprehensive metabolic panel    Hemoglobin A1c    3. Hyperlipidemia associated with type 2 diabetes mellitus (HCC)  E11.69    E78.5       Meds ordered this encounter  Medications   RSV vaccine recomb adjuvanted (AREXVY) 120 MCG/0.5ML injection    Sig: Inject 0.5 mLs into the muscle once for 1 dose.    Dispense:  0.5 mL    Refill:  0    Return precautions advised.  Garret Reddish, MD

## 2022-10-25 ENCOUNTER — Encounter: Payer: Self-pay | Admitting: Family Medicine

## 2022-11-09 DIAGNOSIS — Z85828 Personal history of other malignant neoplasm of skin: Secondary | ICD-10-CM | POA: Diagnosis not present

## 2022-11-09 DIAGNOSIS — L814 Other melanin hyperpigmentation: Secondary | ICD-10-CM | POA: Diagnosis not present

## 2022-11-09 DIAGNOSIS — L821 Other seborrheic keratosis: Secondary | ICD-10-CM | POA: Diagnosis not present

## 2022-11-09 DIAGNOSIS — L720 Epidermal cyst: Secondary | ICD-10-CM | POA: Diagnosis not present

## 2022-11-09 DIAGNOSIS — L570 Actinic keratosis: Secondary | ICD-10-CM | POA: Diagnosis not present

## 2023-02-14 ENCOUNTER — Other Ambulatory Visit: Payer: Self-pay | Admitting: Family Medicine

## 2023-03-07 ENCOUNTER — Ambulatory Visit: Payer: Medicare Other | Admitting: Neurology

## 2023-04-04 DIAGNOSIS — E119 Type 2 diabetes mellitus without complications: Secondary | ICD-10-CM | POA: Diagnosis not present

## 2023-04-04 DIAGNOSIS — Z961 Presence of intraocular lens: Secondary | ICD-10-CM | POA: Diagnosis not present

## 2023-04-04 DIAGNOSIS — H26491 Other secondary cataract, right eye: Secondary | ICD-10-CM | POA: Diagnosis not present

## 2023-04-04 LAB — HM DIABETES EYE EXAM

## 2023-04-26 ENCOUNTER — Encounter: Payer: Self-pay | Admitting: Neurology

## 2023-04-26 ENCOUNTER — Ambulatory Visit (INDEPENDENT_AMBULATORY_CARE_PROVIDER_SITE_OTHER): Payer: Medicare Other | Admitting: Neurology

## 2023-04-26 VITALS — BP 146/70 | HR 63 | Ht 67.0 in | Wt 184.0 lb

## 2023-04-26 DIAGNOSIS — G3184 Mild cognitive impairment, so stated: Secondary | ICD-10-CM | POA: Diagnosis not present

## 2023-04-26 NOTE — Progress Notes (Signed)
Follow-up Visit   Date: 04/26/23   Mitchell Walker MRN: [id number] DOB: Apr 11, 1934   Interim History: Mitchell Walker is a 89 y.o. right-handed Caucasian male returning to the clinic for follow-up of MCI.  The patient was accompanied to the clinic by wife who also provides collateral information.    UPDATE 04/26/2023:  He is here for 1 year follow-up visit.  There has been mild progressive changes in memory over the past year, such that wife oversees medications and appointments, but he mostly still manages medications and administers himself.  He continue to drive.  No home safety concerns.  They are living at [facility] and really enjoy the community there.  He goes to gym class three times per week (chair volleyball, chair yoga) and enjoys walking daily.  He engages socially with other residents.  Mood is good.   Medications:  Current Outpatient Medications on File Prior to Visit  Medication Sig Dispense Refill   albuterol (PROAIR HFA) 108 (90 Base) MCG/ACT inhaler Inhale 2 puffs into the lungs every 6 (six) hours as needed for wheezing or shortness of breath (or cough). 1 each 2   amLODipine (NORVASC) 10 MG tablet TAKE 1 TABLET BY MOUTH ONCE DAILY 90 tablet 2   Blood Glucose Monitoring Suppl (FREESTYLE LITE) w/Device KIT 1 kit by Does not apply route daily at 2 PM. Use to test blood sugars daily. Dx: E11.9 1 kit 3   Cholecalciferol (VITAMIN D-3) 125 MCG (5000 UT) TABS Take 1 tablet by mouth daily.     ezetimibe (ZETIA) 10 MG tablet TAKE 1 TABLET BY MOUTH ONCE DAILY 90 tablet 2   glucose blood (FREESTYLE LITE) test strip Use to test blood sugars daily. Dx: E11.9 100 each 12   Lancets (FREESTYLE) lancets Use to test blood sugars daily. Dx: E11.9 100 each 12   metFORMIN (GLUCOPHAGE) 500 MG tablet TAKE 1 TABLET BY MOUTH ONCE DAILY WITH BREAKFAST *TAKE WITH FOOD* 0.9 tablet 2   valsartan (DIOVAN) 80 MG tablet TAKE 1 TABLET BY MOUTH ONCE DAILY 90 tablet 2   vitamin B-12 (CYANOCOBALAMIN) 1000  MCG tablet Take 1,000 mcg by mouth daily.     No current facility-administered medications on file prior to visit.    Allergies:  Allergies  Allergen Reactions   Celecoxib     REACTION: Upset GI   Sulfonamide Derivatives     REACTION: rash    Vital Signs:  BP (!) 146/70   Pulse 63   Ht 5\' 7"  (1.702 m)   Wt 184 lb (83.5 kg)   SpO2 94%   BMI 28.82 kg/m    Neurological Exam: MENTAL STATUS including orientation to time, place, person, recent and remote memory, attention span and concentration, language, and fund of knowledge is normal.  Speech is not dysarthric.  Oriented to year, month, day of the week, date was incorrect. Home address is correct.   CRANIAL NERVES:   Pupils equal round and reactive to light.  Normal conjugate, extra-ocular eye movements in all directions of gaze.  No ptosis.    MOTOR:  Motor strength is 5/5 in all extremities  COORDINATION/GAIT:  Gait narrow based and stable.   Data: Labs 10/22/2016:  Vitamin B12 422, RPR neg, TSH 2.34, HIV NR   MRI brain wo contrast 04/20/2017: 1. No acute intracranial abnormality. 2. Moderate for age chronic microvascular ischemic changes and moderate diffuse parenchymal volume loss of the brain. 3. Moderate diffuse paranasal sinus disease greatest in ethmoid and right  sphenoid sinuses.  Neuropsychological testing 06/23/2017:  Amnestic mild cognitive impairment  IMPRESSION/PLAN: Mild cognitive impairment, amnestic.  Mild progression as expected, but overall remains highly independent with ADLs and IADLs.  Continue to monitor Continue to engage in mentally stimulating activities Continue exercise regimen  Return to clinic in 1 year  Thank you for allowing me to participate in patient's care.  If I can answer any additional questions, I would be pleased to do so.    Sincerely,    Abelina Ketron K. Allena Katz, DO

## 2023-04-26 NOTE — Patient Instructions (Signed)
It was great to see you today!  I will see you back in 1 year 

## 2023-04-27 ENCOUNTER — Encounter: Payer: Self-pay | Admitting: Family Medicine

## 2023-04-27 ENCOUNTER — Ambulatory Visit (INDEPENDENT_AMBULATORY_CARE_PROVIDER_SITE_OTHER): Payer: Medicare Other | Admitting: Family Medicine

## 2023-04-27 VITALS — BP 120/70 | HR 59 | Temp 97.3°F | Ht 67.0 in | Wt 183.0 lb

## 2023-04-27 DIAGNOSIS — J439 Emphysema, unspecified: Secondary | ICD-10-CM | POA: Diagnosis not present

## 2023-04-27 DIAGNOSIS — Z7984 Long term (current) use of oral hypoglycemic drugs: Secondary | ICD-10-CM | POA: Diagnosis not present

## 2023-04-27 DIAGNOSIS — E119 Type 2 diabetes mellitus without complications: Secondary | ICD-10-CM | POA: Diagnosis not present

## 2023-04-27 DIAGNOSIS — I10 Essential (primary) hypertension: Secondary | ICD-10-CM

## 2023-04-27 DIAGNOSIS — I700 Atherosclerosis of aorta: Secondary | ICD-10-CM | POA: Diagnosis not present

## 2023-04-27 DIAGNOSIS — G3184 Mild cognitive impairment, so stated: Secondary | ICD-10-CM | POA: Diagnosis not present

## 2023-04-27 DIAGNOSIS — E785 Hyperlipidemia, unspecified: Secondary | ICD-10-CM | POA: Diagnosis not present

## 2023-04-27 DIAGNOSIS — E1169 Type 2 diabetes mellitus with other specified complication: Secondary | ICD-10-CM | POA: Diagnosis not present

## 2023-04-27 LAB — COMPREHENSIVE METABOLIC PANEL
ALT: 16 U/L (ref 0–53)
AST: 19 U/L (ref 0–37)
Albumin: 4.1 g/dL (ref 3.5–5.2)
Alkaline Phosphatase: 84 U/L (ref 39–117)
BUN: 10 mg/dL (ref 6–23)
CO2: 32 mEq/L (ref 19–32)
Calcium: 9.2 mg/dL (ref 8.4–10.5)
Chloride: 102 mEq/L (ref 96–112)
Creatinine, Ser: 0.82 mg/dL (ref 0.40–1.50)
GFR: 79.39 mL/min (ref >60.00)
Glucose, Bld: 117 mg/dL — ABNORMAL HIGH (ref 70–99)
Potassium: 4 mEq/L (ref 3.5–5.1)
Sodium: 138 mEq/L (ref 135–145)
Total Bilirubin: 0.8 mg/dL (ref 0.2–1.2)
Total Protein: 7 g/dL (ref 6.0–8.3)

## 2023-04-27 LAB — HEMOGLOBIN A1C: Hgb A1c MFr Bld: 6.4 % (ref 4.6–6.5)

## 2023-04-27 NOTE — Patient Instructions (Addendum)
You are eligible to schedule your annual wellness visit with our nurse specialist Inetta Fermo.  Please consider scheduling this before you leave today  Please stop by lab before you go If you have mychart- we will send your results within 3 business days of [place] receiving them.  If you do not have mychart- we will call you about results within 5 business days of [place] receiving them.  *please also note that you will see labs on mychart as soon as they post. I will later go in and write notes on them- will say "notes from Dr. Durene Cal"   Recommended follow up: Return in about 6 months (around 10/28/2023) for followup or sooner if needed.Schedule b4 you leave.

## 2023-04-27 NOTE — Progress Notes (Signed)
Phone [phone] In person visit   Subjective:   Mitchell Walker is a 89 y.o. year old very pleasant male patient who presents for/with See problem oriented charting Chief Complaint  Patient presents with   Memory Loss    Would like mmse   Past Medical History-  Patient Active Problem List   Diagnosis Date Noted   Advanced directives, counseling/discussion 05/20/2017    Priority: High   Diabetes mellitus (HCC) 05/20/2017    Priority: High   B12 deficiency 05/02/2017    Priority: High   MCI (mild cognitive impairment) 10/22/2016    Priority: High   Aortic atherosclerosis (HCC) 10/20/2020    Priority: Medium    Emphysema lung (HCC) 10/01/2020    Priority: Medium    Glaucoma 12/27/2017    Priority: Medium    Hyperlipidemia associated with type 2 diabetes mellitus (HCC) 02/03/2009    Priority: Medium    Hypertension 07/17/2007    Priority: Medium    Former smoker 05/21/2015    Priority: Low   Adenomatous colon polyp 05/21/2015    Priority: Low   Chronic frontal sinusitis 12/30/2010    Priority: Low   ACNE ROSACEA 04/15/2010    Priority: Low   Actinic keratosis 02/03/2009    Priority: Low   Basal cell carcinoma of skin 02/29/2008    Priority: Low   ERECTILE DYSFUNCTION 12/11/2007    Priority: Low   Osteoarthritis 07/17/2007    Priority: Low    Medications- reviewed and updated Current Outpatient Medications  Medication Sig Dispense Refill   albuterol (PROAIR HFA) 108 (90 Base) MCG/ACT inhaler Inhale 2 puffs into the lungs every 6 (six) hours as needed for wheezing or shortness of breath (or cough). 1 each 2   amLODipine (NORVASC) 10 MG tablet TAKE 1 TABLET BY MOUTH ONCE DAILY 90 tablet 2   Blood Glucose Monitoring Suppl (FREESTYLE LITE) w/Device KIT 1 kit by Does not apply route daily at 2 PM. Use to test blood sugars daily. Dx: E11.9 1 kit 3   Cholecalciferol (VITAMIN D-3) 125 MCG (5000 UT) TABS Take 1 tablet by mouth daily.     ezetimibe (ZETIA) 10 MG tablet  TAKE 1 TABLET BY MOUTH ONCE DAILY 90 tablet 2   glucose blood (FREESTYLE LITE) test strip Use to test blood sugars daily. Dx: E11.9 100 each 12   Lancets (FREESTYLE) lancets Use to test blood sugars daily. Dx: E11.9 100 each 12   metFORMIN (GLUCOPHAGE) 500 MG tablet TAKE 1 TABLET BY MOUTH ONCE DAILY WITH BREAKFAST *TAKE WITH FOOD* 0.9 tablet 2   valsartan (DIOVAN) 80 MG tablet TAKE 1 TABLET BY MOUTH ONCE DAILY 90 tablet 2   vitamin B-12 (CYANOCOBALAMIN) 1000 MCG tablet Take 1,000 mcg by mouth daily.     No current facility-administered medications for this visit.     Objective:  BP 120/70   Pulse (!) 59   Temp (!) 97.3 F (36.3 C)   Ht 5\' 7"  (1.702 m)   Wt 183 lb (83 kg)   SpO2 95%   BMI 28.66 kg/m  Gen: NAD, resting comfortably CV: RRR no murmurs rubs or gallops Lungs: CTAB no crackles, wheeze, rhonchi Ext: no edema Skin: warm, dry  MMSE 27/30 today- loses 1 for date and 2 for delayed recall    Assessment and Plan   % Mild cognitive impairment-follows with Dr. Allena Katz - just had visit yesterday with slight progression noted -MMSE 28/30 in 2017 -in 2024/today was 27/30   #hypertension S: compliant  with Amlodipine 10 mg and valsartan 80 mg -Chronic cough: Benazepril 20mg  -off hctz with electrolyte issues BP Readings from Last 3 Encounters:  04/27/23 120/70  04/26/23 (!) 146/70  10/21/22 130/62  A/P: stable- continue current medicines   # Diabetes S: Medication: Metformin 500 mg daily- better than 50% now- has improved Exercise and diet- regular exercise including chair volleyball and yoga  and boxing burner Lab Results  Component Value Date   HGBA1C 6.4 10/21/2022   HGBA1C 6.6 (H) 06/29/2022   HGBA1C 7.9 (H) 02/12/2022   A/P: hopefully stable- update a1c today. Continue current meds for now    #hyperlipidemia/aortic atherosclerosis/coronary artery calcifications- incidental finding but wants to remain off statin #Coronary calcium-noted on CT chest S: We did add  zetia in light of aortic atherosclerosis but wanting to be cautious about statin due to memory concerns Lab Results  Component Value Date   CHOL 168 06/29/2022   HDL 39.60 06/29/2022   LDLCALC 104 (H) 03/31/2021   LDLDIRECT 102.0 06/29/2022   TRIG 230.0 (H) 06/29/2022   CHOLHDL 4 06/29/2022  A/P: lipids above goal- prefers to continue current medications - feels could improve diet- not interested in statin  Aortic atherosclerosis (presumed stable)- LDL goal ideally <70 - above goal but does not want to change medications- same goal for coronary calcium  # B12 Deficiency S: He remains on daily supplements 1000 mcg also multivitamin Lab Results  Component Value Date   VITAMINB12 946 (H) 06/29/2022  A/P: stable- continue current medicines - can recheck next labs  #emphysema- incidental finding on imaging chest xray 2021 - albuterol available if needed- almost never using- occasional with cough   #Glaucoma- no issues since laser treatment for narrow angle glaucoma - no recurrence  Recommended follow up: Return in about 6 months (around 10/28/2023) for followup or sooner if needed.Schedule b4 you leave. Future Appointments  Date Time Provider Department Center  04/30/2024  9:30 AM Nita Sickle K, DO [facility] None   Lab/Order associations:   ICD-10-CM   1. MCI (mild cognitive impairment)  G31.84     2. Type 2 diabetes mellitus without complication, without long-term current use of insulin (HCC)  E11.9     3. Aortic atherosclerosis (HCC)  I70.0     4. Pulmonary emphysema, unspecified emphysema type (HCC)  J43.9     5. Hyperlipidemia associated with type 2 diabetes mellitus (HCC)  E11.69    E78.5     6. Primary hypertension  I10      No orders of the defined types were placed in this encounter.  Return precautions advised.  Tana Conch, MD

## 2023-05-11 DIAGNOSIS — L814 Other melanin hyperpigmentation: Secondary | ICD-10-CM | POA: Diagnosis not present

## 2023-05-11 DIAGNOSIS — D225 Melanocytic nevi of trunk: Secondary | ICD-10-CM | POA: Diagnosis not present

## 2023-05-11 DIAGNOSIS — L821 Other seborrheic keratosis: Secondary | ICD-10-CM | POA: Diagnosis not present

## 2023-05-11 DIAGNOSIS — Z85828 Personal history of other malignant neoplasm of skin: Secondary | ICD-10-CM | POA: Diagnosis not present

## 2023-05-11 DIAGNOSIS — L820 Inflamed seborrheic keratosis: Secondary | ICD-10-CM | POA: Diagnosis not present

## 2023-05-11 DIAGNOSIS — L570 Actinic keratosis: Secondary | ICD-10-CM | POA: Diagnosis not present

## 2023-05-23 ENCOUNTER — Other Ambulatory Visit: Payer: Self-pay | Admitting: Family Medicine

## 2023-05-26 ENCOUNTER — Other Ambulatory Visit: Payer: Self-pay | Admitting: Family Medicine

## 2023-10-06 DIAGNOSIS — Z23 Encounter for immunization: Secondary | ICD-10-CM | POA: Diagnosis not present

## 2023-10-31 ENCOUNTER — Ambulatory Visit (INDEPENDENT_AMBULATORY_CARE_PROVIDER_SITE_OTHER): Payer: Medicare Other | Admitting: Family Medicine

## 2023-10-31 ENCOUNTER — Encounter: Payer: Self-pay | Admitting: Family Medicine

## 2023-10-31 VITALS — BP 144/70 | HR 66 | Temp 98.3°F | Ht 67.0 in | Wt 189.8 lb

## 2023-10-31 DIAGNOSIS — E119 Type 2 diabetes mellitus without complications: Secondary | ICD-10-CM

## 2023-10-31 DIAGNOSIS — E538 Deficiency of other specified B group vitamins: Secondary | ICD-10-CM

## 2023-10-31 DIAGNOSIS — E785 Hyperlipidemia, unspecified: Secondary | ICD-10-CM | POA: Diagnosis not present

## 2023-10-31 DIAGNOSIS — I10 Essential (primary) hypertension: Secondary | ICD-10-CM | POA: Diagnosis not present

## 2023-10-31 DIAGNOSIS — Z7984 Long term (current) use of oral hypoglycemic drugs: Secondary | ICD-10-CM

## 2023-10-31 DIAGNOSIS — E1169 Type 2 diabetes mellitus with other specified complication: Secondary | ICD-10-CM | POA: Diagnosis not present

## 2023-10-31 DIAGNOSIS — G3184 Mild cognitive impairment, so stated: Secondary | ICD-10-CM | POA: Diagnosis not present

## 2023-10-31 LAB — LIPID PANEL
Cholesterol: 177 mg/dL (ref 0–200)
HDL: 48.9 mg/dL (ref >39.00)
LDL Cholesterol: 110 mg/dL — ABNORMAL HIGH (ref 0–99)
NonHDL: 128.14
Total CHOL/HDL Ratio: 4
Triglycerides: 89 mg/dL (ref 0.0–149.0)
VLDL: 17.8 mg/dL (ref 0.0–40.0)

## 2023-10-31 LAB — COMPREHENSIVE METABOLIC PANEL
ALT: 16 U/L (ref 0–53)
AST: 22 U/L (ref 0–37)
Albumin: 4.2 g/dL (ref 3.5–5.2)
Alkaline Phosphatase: 76 U/L (ref 39–117)
BUN: 13 mg/dL (ref 6–23)
CO2: 25 meq/L (ref 19–32)
Calcium: 9 mg/dL (ref 8.4–10.5)
Chloride: 103 meq/L (ref 96–112)
Creatinine, Ser: 0.88 mg/dL (ref 0.40–1.50)
GFR: 77.44 mL/min (ref >60.00)
Glucose, Bld: 114 mg/dL — ABNORMAL HIGH (ref 70–99)
Potassium: 4.1 meq/L (ref 3.5–5.1)
Sodium: 136 meq/L (ref 135–145)
Total Bilirubin: 0.7 mg/dL (ref 0.2–1.2)
Total Protein: 7.1 g/dL (ref 6.0–8.3)

## 2023-10-31 LAB — VITAMIN B12: Vitamin B-12: 407 pg/mL (ref 211–911)

## 2023-10-31 LAB — CBC WITH DIFFERENTIAL/PLATELET
Basophils Absolute: 0 10*3/uL (ref 0.0–0.1)
Basophils Relative: 0.8 % (ref 0.0–3.0)
Eosinophils Absolute: 0.3 10*3/uL (ref 0.0–0.7)
Eosinophils Relative: 5.8 % — ABNORMAL HIGH (ref 0.0–5.0)
HCT: 39.8 % (ref 39.0–52.0)
Hemoglobin: 13.3 g/dL (ref 13.0–17.0)
Lymphocytes Relative: 33 % (ref 12.0–46.0)
Lymphs Abs: 1.9 10*3/uL (ref 0.7–4.0)
MCHC: 33.4 g/dL (ref 30.0–36.0)
MCV: 88.6 fL (ref 78.0–100.0)
Monocytes Absolute: 0.6 10*3/uL (ref 0.1–1.0)
Monocytes Relative: 9.8 % (ref 3.0–12.0)
Neutro Abs: 2.9 10*3/uL (ref 1.4–7.7)
Neutrophils Relative %: 50.6 % (ref 43.0–77.0)
Platelets: 213 10*3/uL (ref 150.0–400.0)
RBC: 4.49 Mil/uL (ref 4.22–5.81)
RDW: 14.2 % (ref 11.5–15.5)
WBC: 5.8 10*3/uL (ref 4.0–10.5)

## 2023-10-31 LAB — HEMOGLOBIN A1C: Hgb A1c MFr Bld: 6.7 % — ABNORMAL HIGH (ref 4.6–6.5)

## 2023-10-31 NOTE — Patient Instructions (Addendum)
You are eligible to schedule your annual wellness visit with our nurse specialist Mitchell Walker.  Please consider scheduling this before you leave today  Let us know when you get your COVID vaccine at the pharmacy.  blood pressure higher today than usual and at home- please send me most recent reading from home or next reading- as long as <135/85 likely remain on same dose of medicine  Please stop by lab before you go If you have mychart- we will send your results within 3 business days of [place] receiving them.  If you do not have mychart- we will call you about results within 5 business days of [place] receiving them.  *please also note that you will see labs on mychart as soon as they post. I will later go in and write notes on them- will say "notes from Dr. Durene Cal"   Recommended follow up: Return in about 6 months (around 04/29/2024) for followup or sooner if needed.Schedule b4 you leave.

## 2023-10-31 NOTE — Progress Notes (Signed)
Phone [phone] In person visit   Subjective:   Mitchell Walker is a 89 y.o. year old very pleasant male patient who presents for/with See problem oriented charting Chief Complaint  Patient presents with   Medical Management of Chronic Issues   Hypertension   Hyperlipidemia   Diabetes    Past Medical History-  Patient Active Problem List   Diagnosis Date Noted   Advanced directives, counseling/discussion 05/20/2017    Priority: High   Diabetes mellitus (HCC) 05/20/2017    Priority: High   B12 deficiency 05/02/2017    Priority: High   MCI (mild cognitive impairment) 10/22/2016    Priority: High   Aortic atherosclerosis (HCC) 10/20/2020    Priority: Medium    Emphysema lung (HCC) 10/01/2020    Priority: Medium    Glaucoma 12/27/2017    Priority: Medium    Hyperlipidemia associated with type 2 diabetes mellitus (HCC) 02/03/2009    Priority: Medium    Hypertension 07/17/2007    Priority: Medium    Former smoker 05/21/2015    Priority: Low   Adenomatous colon polyp 05/21/2015    Priority: Low   Chronic frontal sinusitis 12/30/2010    Priority: Low   ACNE ROSACEA 04/15/2010    Priority: Low   Actinic keratosis 02/03/2009    Priority: Low   Basal cell carcinoma of skin 02/29/2008    Priority: Low   ERECTILE DYSFUNCTION 12/11/2007    Priority: Low   Osteoarthritis 07/17/2007    Priority: Low    Medications- reviewed and updated Current Outpatient Medications  Medication Sig Dispense Refill   albuterol (PROAIR HFA) 108 (90 Base) MCG/ACT inhaler Inhale 2 puffs into the lungs every 6 (six) hours as needed for wheezing or shortness of breath (or cough). 1 each 2   amLODipine (NORVASC) 10 MG tablet TAKE 1 TABLET BY MOUTH ONCE DAILY 90 tablet 2   Blood Glucose Monitoring Suppl (FREESTYLE LITE) w/Device KIT 1 kit by Does not apply route daily at 2 PM. Use to test blood sugars daily. Dx: E11.9 1 kit 3   Cholecalciferol (VITAMIN D-3) 125 MCG (5000 UT) TABS Take 1 tablet by  mouth daily.     ezetimibe (ZETIA) 10 MG tablet TAKE 1 TABLET BY MOUTH ONCE DAILY 90 tablet 2   glucose blood (FREESTYLE LITE) test strip Use to test blood sugars daily. Dx: E11.9 100 each 12   Lancets (FREESTYLE) lancets Use to test blood sugars daily. Dx: E11.9 100 each 12   metFORMIN (GLUCOPHAGE) 500 MG tablet TAKE 1 TABLET ONCE DAILY WITH BREAKFAST *TAKE WITH FOOD* 90 tablet 2   valsartan (DIOVAN) 80 MG tablet TAKE 1 TABLET BY MOUTH ONCE DAILY 90 tablet 2   vitamin B-12 (CYANOCOBALAMIN) 1000 MCG tablet Take 1,000 mcg by mouth daily.     No current facility-administered medications for this visit.     Objective:  BP (!) 144/70   Pulse 66   Temp 98.3 F (36.8 C)   Ht 5\' 7"  (1.702 m)   Wt 189 lb 12.8 oz (86.1 kg)   SpO2 95%   BMI 29.73 kg/m  Gen: NAD, resting comfortably CV: RRR no murmurs rubs or gallops Lungs: CTAB no crackles, wheeze, rhonchi Abdomen: soft/nontender/nondistended/normal bowel sounds. No rebound or guarding.  Ext: trace edema Skin: warm, dry  Diabetic Foot Exam - Simple   Simple Foot Form Diabetic Foot exam was performed with the following findings: Yes 10/31/2023 10:35 AM  Visual Inspection No deformities, no ulcerations, no other skin breakdown  bilaterally: Yes Sensation Testing Intact to touch and monofilament testing bilaterally: Yes Pulse Check Posterior Tibialis and Dorsalis pulse intact bilaterally: Yes Comments       Assessment and Plan    % Mild cognitive impairment-follows with Dr. Allena Katz  -MMSE 28/30 in 2017 -ongoing concerns- has upcoming visit with Dr. Allena Katz  #hypertension S: compliant with Amlodipine 10 mg and valsartan 80 mg -Chronic cough: Benazepril 20mg  -off hctz with electrolyte issues -usually 120s at home and checks weekly with nurse BP Readings from Last 3 Encounters:  10/31/23 (!) 144/70  04/27/23 120/70  04/26/23 (!) 146/70  A/P: blood pressure higher today than usual and at home- please send me most recent reading  from home or next reading- as long as <135/85 likely remain on same dose of medicine  # Diabetes S: Medication: Metformin 500 mg. Metformin probably at least 4-5 days a week CBGs- 110-123 Exercise and diet- chair volleyball and yoga, some walking Lab Results  Component Value Date   HGBA1C 6.4 04/27/2023   HGBA1C 6.4 10/21/2022   HGBA1C 6.6 (H) 06/29/2022   A/P: hopefully stable- update a1c today. Continue current meds for now    #hyperlipidemia/aortic atherosclerosis/coronary artery calcifications- incidental finding but wants to remain off statin #Coronary calcium-noted on CT chest S: We did add zetia 10 mg in light of aortic atherosclerosis but wanting to be cautious about statin due to memory concerns Lab Results  Component Value Date   CHOL 168 06/29/2022   HDL 39.60 06/29/2022   LDLCALC 104 (H) 03/31/2021   LDLDIRECT 102.0 06/29/2022   TRIG 230.0 (H) 06/29/2022   CHOLHDL 4 06/29/2022    A/P: #s slightly above goal but still wanting to stay off statin unless symptomatic/worsening- continue zetia and update lipids    # B12 Deficiency S: He remains on daily supplements 1000 mcg typically- has been out lately Lab Results  Component Value Date   VITAMINB12 946 (H) 06/29/2022  A/P: will update this today- may be slightly low as off meds  #Glaucoma- no issues since laser treatment for narrow angle glaucoma- has been doing well - likely can use prednisone if needed   Recommended follow up: Return in about 6 months (around 04/29/2024) for followup or sooner if needed.Schedule b4 you leave. Future Appointments  Date Time Provider Department Center  04/30/2024  9:30 AM Nita Sickle K, DO [facility] None    Lab/Order associations:   ICD-10-CM   1. Type 2 diabetes mellitus without complication, without long-term current use of insulin (HCC)  E11.9     2. MCI (mild cognitive impairment)  G31.84     3. Essential hypertension  I10     4. Hyperlipidemia associated with type 2  diabetes mellitus (HCC)  E11.69    E78.5     5. B12 deficiency  E53.8       No orders of the defined types were placed in this encounter.   Return precautions advised.  Tana Conch, MD

## 2023-11-01 ENCOUNTER — Encounter: Payer: Self-pay | Admitting: Family Medicine

## 2023-11-01 DIAGNOSIS — Z23 Encounter for immunization: Secondary | ICD-10-CM | POA: Diagnosis not present

## 2023-11-16 DIAGNOSIS — D0439 Carcinoma in situ of skin of other parts of face: Secondary | ICD-10-CM | POA: Diagnosis not present

## 2023-11-16 DIAGNOSIS — L570 Actinic keratosis: Secondary | ICD-10-CM | POA: Diagnosis not present

## 2023-11-16 DIAGNOSIS — D692 Other nonthrombocytopenic purpura: Secondary | ICD-10-CM | POA: Diagnosis not present

## 2023-11-16 DIAGNOSIS — L720 Epidermal cyst: Secondary | ICD-10-CM | POA: Diagnosis not present

## 2023-11-16 DIAGNOSIS — C4441 Basal cell carcinoma of skin of scalp and neck: Secondary | ICD-10-CM | POA: Diagnosis not present

## 2023-11-16 DIAGNOSIS — D224 Melanocytic nevi of scalp and neck: Secondary | ICD-10-CM | POA: Diagnosis not present

## 2023-11-16 DIAGNOSIS — Z85828 Personal history of other malignant neoplasm of skin: Secondary | ICD-10-CM | POA: Diagnosis not present

## 2023-11-16 DIAGNOSIS — L821 Other seborrheic keratosis: Secondary | ICD-10-CM | POA: Diagnosis not present

## 2023-11-16 DIAGNOSIS — L814 Other melanin hyperpigmentation: Secondary | ICD-10-CM | POA: Diagnosis not present

## 2024-01-25 DIAGNOSIS — Z85828 Personal history of other malignant neoplasm of skin: Secondary | ICD-10-CM | POA: Diagnosis not present

## 2024-01-25 DIAGNOSIS — C4441 Basal cell carcinoma of skin of scalp and neck: Secondary | ICD-10-CM | POA: Diagnosis not present

## 2024-02-08 DIAGNOSIS — L570 Actinic keratosis: Secondary | ICD-10-CM | POA: Diagnosis not present

## 2024-02-13 ENCOUNTER — Other Ambulatory Visit: Payer: Self-pay | Admitting: Family Medicine

## 2024-04-30 ENCOUNTER — Ambulatory Visit (INDEPENDENT_AMBULATORY_CARE_PROVIDER_SITE_OTHER): Payer: Medicare Other | Admitting: Neurology

## 2024-04-30 VITALS — BP 160/83 | HR 74 | Ht 67.0 in | Wt 194.0 lb

## 2024-04-30 DIAGNOSIS — G3184 Mild cognitive impairment, so stated: Secondary | ICD-10-CM | POA: Diagnosis not present

## 2024-04-30 NOTE — Progress Notes (Signed)
 Follow-up Visit   Date: 04/30/24   DICKEY CAAMANO MRN: [id number] DOB: December 05, 1934   Interim History: Mitchell Walker is a 89 y.o. right-handed Caucasian male returning to the clinic for follow-up of MCI.  The patient was accompanied to the clinic by wife who also provides collateral information.    UPDATE 04/26/2023:  He is here for 1 year visit.  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  Wife has noticed that he continues to have mild memory changes over the past year, such as forgetting the plan for the day.  He is able to sort his medications and wife oversees medication and appointments, but he administers medications himself.  He continues to drive and does not have any safety concerns.  No interval falls.    Medications:  Current Outpatient Medications on File Prior to Visit  Medication Sig Dispense Refill   albuterol  (PROAIR  HFA) 108 (90 Base) MCG/ACT inhaler Inhale 2 puffs into the lungs every 6 (six) hours as needed for wheezing or shortness of breath (or cough). 1 each 2   amLODipine  (NORVASC ) 10 MG tablet TAKE 1 TABLET BY MOUTH ONCE DAILY 90 tablet 2   Blood Glucose Monitoring Suppl (FREESTYLE LITE) w/Device KIT 1 kit by Does not apply route daily at 2 PM. Use to test blood sugars daily. Dx: E11.9 1 kit 3   Cholecalciferol (VITAMIN D-3) 125 MCG (5000 UT) TABS Take 1 tablet by mouth daily.     ezetimibe  (ZETIA ) 10 MG tablet TAKE 1 TABLET BY MOUTH ONCE DAILY 90 tablet 2   glucose blood (FREESTYLE LITE) test strip Use to test blood sugars daily. Dx: E11.9 100 each 12   Lancets (FREESTYLE) lancets Use to test blood sugars daily. Dx: E11.9 100 each 12   metFORMIN  (GLUCOPHAGE ) 500 MG tablet TAKE 1 TABLET ONCE DAILY WITH BREAKFAST *TAKE WITH FOOD* 90 tablet 2   valsartan  (DIOVAN ) 80 MG tablet TAKE 1 TABLET BY MOUTH ONCE DAILY 90 tablet 2   vitamin B-12 (CYANOCOBALAMIN ) 1000 MCG tablet Take 1,000 mcg by mouth daily.     No current  facility-administered medications on file prior to visit.    Allergies:  Allergies  Allergen Reactions   Celecoxib     REACTION: Upset GI   Sulfonamide Derivatives     REACTION: rash    Vital Signs:  BP (!) 160/83   Pulse 74   Ht 5\' 7"  (1.702 m)   Wt 194 lb (88 kg)   SpO2 95%   BMI 30.38 kg/m    Neurological Exam: MENTAL STATUS including orientation to time, place, person, recent and remote memory, attention span and concentration, language, and fund of knowledge is normal.  Speech is not dysarthric.  Oriented to year, month, day of the week, date was incorrect. Home address is correct.   CRANIAL NERVES:   Pupils equal round and reactive to light.  Normal conjugate, extra-ocular eye movements in all directions of gaze.  No ptosis.    MOTOR:  Motor strength is 5/5 in all extremities  COORDINATION/GAIT:  Gait narrow based and stable.   Data: Labs 10/22/2016:  Vitamin B12 422, RPR neg, TSH 2.34, HIV NR   MRI brain wo contrast 04/20/2017: 1. No acute intracranial abnormality. 2. Moderate for age chronic microvascular ischemic changes and moderate diffuse parenchymal volume loss of the brain. 3. Moderate diffuse paranasal sinus disease greatest in ethmoid and right sphenoid sinuses.  Neuropsychological testing 06/23/2017:  Amnestic mild cognitive impairment  IMPRESSION/PLAN: Mild cognitive impairment, amnestic.  Mild progression as expected, but overall remains highly independent with ADLs and IADLs.  Continue to monitor  Continue to engage in mentally stimulating activities as well as socially  Return to clinic in 1 year  Thank you for allowing me to participate in patient's care.  If I can answer any additional questions, I would be pleased to do so.    Sincerely,    Arno Cullers K. Lydia Sams, DO

## 2024-05-02 ENCOUNTER — Encounter: Payer: Self-pay | Admitting: Family Medicine

## 2024-05-02 ENCOUNTER — Ambulatory Visit (INDEPENDENT_AMBULATORY_CARE_PROVIDER_SITE_OTHER): Payer: Medicare Other | Admitting: Family Medicine

## 2024-05-02 VITALS — BP 138/70 | HR 71 | Temp 97.2°F | Ht 67.0 in | Wt 192.8 lb

## 2024-05-02 DIAGNOSIS — Z7984 Long term (current) use of oral hypoglycemic drugs: Secondary | ICD-10-CM

## 2024-05-02 DIAGNOSIS — E785 Hyperlipidemia, unspecified: Secondary | ICD-10-CM | POA: Diagnosis not present

## 2024-05-02 DIAGNOSIS — I10 Essential (primary) hypertension: Secondary | ICD-10-CM

## 2024-05-02 DIAGNOSIS — E1169 Type 2 diabetes mellitus with other specified complication: Secondary | ICD-10-CM

## 2024-05-02 DIAGNOSIS — E119 Type 2 diabetes mellitus without complications: Secondary | ICD-10-CM

## 2024-05-02 LAB — COMPREHENSIVE METABOLIC PANEL WITH GFR
ALT: 15 U/L (ref 0–53)
AST: 18 U/L (ref 0–37)
Albumin: 4.1 g/dL (ref 3.5–5.2)
Alkaline Phosphatase: 76 U/L (ref 39–117)
BUN: 14 mg/dL (ref 6–23)
CO2: 26 meq/L (ref 19–32)
Calcium: 9.1 mg/dL (ref 8.4–10.5)
Chloride: 101 meq/L (ref 96–112)
Creatinine, Ser: 0.88 mg/dL (ref 0.40–1.50)
GFR: 77.16 mL/min (ref >60.00)
Glucose, Bld: 115 mg/dL — ABNORMAL HIGH (ref 70–99)
Potassium: 3.9 meq/L (ref 3.5–5.1)
Sodium: 136 meq/L (ref 135–145)
Total Bilirubin: 0.9 mg/dL (ref 0.2–1.2)
Total Protein: 7.1 g/dL (ref 6.0–8.3)

## 2024-05-02 LAB — MICROALBUMIN / CREATININE URINE RATIO
Creatinine,U: 40 mg/dL
Microalb Creat Ratio: 41.3 mg/g — ABNORMAL HIGH (ref 0.0–30.0)
Microalb, Ur: 1.7 mg/dL (ref 0.0–1.9)

## 2024-05-02 LAB — HEMOGLOBIN A1C: Hgb A1c MFr Bld: 6.8 % — ABNORMAL HIGH (ref 4.6–6.5)

## 2024-05-02 NOTE — Progress Notes (Signed)
 Phone [phone] In person visit   Subjective:   Mitchell Walker is a 89 y.o. year old very pleasant male patient who presents for/with See problem oriented charting Chief Complaint  Patient presents with   Medical Management of Chronic Issues   Diabetes    DEE requested.   Hypertension    Past Medical History-  Patient Active Problem List   Diagnosis Date Noted   Advanced directives, counseling/discussion 05/20/2017    Priority: High   Diabetes mellitus (HCC) 05/20/2017    Priority: High   B12 deficiency 05/02/2017    Priority: High   MCI (mild cognitive impairment) 10/22/2016    Priority: High   Aortic atherosclerosis (HCC) 10/20/2020    Priority: Medium    Emphysema lung (HCC) 10/01/2020    Priority: Medium    Glaucoma 12/27/2017    Priority: Medium    Hyperlipidemia associated with type 2 diabetes mellitus (HCC) 02/03/2009    Priority: Medium    Hypertension 07/17/2007    Priority: Medium    Former smoker 05/21/2015    Priority: Low   Adenomatous colon polyp 05/21/2015    Priority: Low   Chronic frontal sinusitis 12/30/2010    Priority: Low   ACNE ROSACEA 04/15/2010    Priority: Low   Actinic keratosis 02/03/2009    Priority: Low   Basal cell carcinoma of skin 02/29/2008    Priority: Low   ERECTILE DYSFUNCTION 12/11/2007    Priority: Low   Osteoarthritis 07/17/2007    Priority: Low    Medications- reviewed and updated Current Outpatient Medications  Medication Sig Dispense Refill   albuterol  (PROAIR  HFA) 108 (90 Base) MCG/ACT inhaler Inhale 2 puffs into the lungs every 6 (six) hours as needed for wheezing or shortness of breath (or cough). 1 each 2   amLODipine  (NORVASC ) 10 MG tablet TAKE 1 TABLET BY MOUTH ONCE DAILY 90 tablet 2   Blood Glucose Monitoring Suppl (FREESTYLE LITE) w/Device KIT 1 kit by Does not apply route daily at 2 PM. Use to test blood sugars daily. Dx: E11.9 1 kit 3   Cholecalciferol (VITAMIN D-3) 125 MCG (5000 UT) TABS Take 1 tablet  by mouth daily.     ezetimibe  (ZETIA ) 10 MG tablet TAKE 1 TABLET BY MOUTH ONCE DAILY 90 tablet 2   glucose blood (FREESTYLE LITE) test strip Use to test blood sugars daily. Dx: E11.9 100 each 12   Lancets (FREESTYLE) lancets Use to test blood sugars daily. Dx: E11.9 100 each 12   metFORMIN  (GLUCOPHAGE ) 500 MG tablet TAKE 1 TABLET ONCE DAILY WITH BREAKFAST *TAKE WITH FOOD* 90 tablet 2   valsartan  (DIOVAN ) 80 MG tablet TAKE 1 TABLET BY MOUTH ONCE DAILY 90 tablet 2   vitamin B-12 (CYANOCOBALAMIN ) 1000 MCG tablet Take 1,000 mcg by mouth daily.     No current facility-administered medications for this visit.     Objective:  BP 138/70   Pulse 71   Temp (!) 97.2 F (36.2 C)   Ht 5\' 7"  (1.702 m)   Wt 192 lb 12.8 oz (87.5 kg)   SpO2 94%   BMI 30.20 kg/m  Gen: NAD, resting comfortably CV: RRR no murmurs rubs or gallops Lungs: CTAB no crackles, wheeze, rhonchi Ext: trace edema Skin: warm, dry     Assessment and Plan   #Skin lesion- erythema and some nodularity on nose- plans to schedule follow up with Dr. Theron Flavin- they do not think it was present at February visit with dermatology   % Mild cognitive  impairment-follows with Dr. Lydia Sams -not on any medication for this -Most recent visit 04/30/2024 with mild progression but remains highly independent with ADLs and IADLs thankfully with plan for 1 year follow-up  #hypertension S: compliant with Amlodipine  10 mg and valsartan  80 mg -Chronic cough: Benazepril  20mg  -off hctz with electrolyte issues -every Tuesday checked at [facility]- usually high 130s BP Readings from Last 3 Encounters:  05/02/24 138/70  04/30/24 (!) 160/83  10/31/23 (!) 144/70  A/P: reasonable control for age- continue current medications   # Diabetes S: Medication: Metformin  500 mg daily CBGs- 120s lately - lowest 105 Exercise and diet- still doing a chair volleyball and some yoga with some limited walking Lab Results  Component Value Date   HGBA1C 6.7 (H)  10/31/2023   HGBA1C 6.4 04/27/2023   HGBA1C 6.4 10/21/2022  A/P: Hopefuly stable or improved- update A1c with labs today. Continue current meds for now.  I discussed the microalbumin to creatinine lab error with patient that occurred with [place] labs.  Essentially the ratio was off by a factor of 10.  In this patient's individual case his 3.0 converts to right at 30 and already on valsartan  so we opted to update today and if it has worsened would increase valsartan  to 160 mg    #hyperlipidemia/aortic atherosclerosis/coronary artery calcifications- incidental finding but wants to remain off statin #Coronary calcium-noted on CT chest S: We did add zetia  10 mg in light of aortic atherosclerosis but wanting to be cautious about statin due to memory concerns  Lab Results  Component Value Date   CHOL 177 10/31/2023   HDL 48.90 10/31/2023   LDLCALC 110 (H) 10/31/2023   LDLDIRECT 102.0 06/29/2022   TRIG 89.0 10/31/2023   CHOLHDL 4 10/31/2023    A/P: Mild poor control but we are tolerating this-family prefers to avoid statin due to potential memory loss concern  # B12 Deficiency S: He remains on daily supplements 1000 mcg   Lab Results  Component Value Date   VITAMINB12 407 10/31/2023   A/P: Well-controlled last visit-continue current medication and recheck annually   #emphysema- incidental finding on imaging chest xray 2021 - albuterol  available if needed- not needing much. Some cough at night for years  Recommended follow up: Return in about 6 months (around 11/02/2024) for followup or sooner if needed.Schedule b4 you leave. Future Appointments  Date Time Provider Department Center  04/29/2025  9:30 AM Patel, Donika K, DO [facility] None    Lab/Order associations:   ICD-10-CM   1. Type 2 diabetes mellitus without complication, without long-term current use of insulin (HCC)  E11.9 Hemoglobin A1c    Urine Microalbumin w/creat. ratio    Comprehensive metabolic panel with GFR    2.  Primary hypertension  I10 Comprehensive metabolic panel with GFR    3. Hyperlipidemia associated with type 2 diabetes mellitus (HCC)  E11.69    E78.5       No orders of the defined types were placed in this encounter.   Return precautions advised.  Clarisa Crooked, MD

## 2024-05-02 NOTE — Patient Instructions (Addendum)
 You are eligible to schedule your annual wellness visit with our nurse specialist Brian Campanile.  Please consider scheduling this before you leave today  I like your idea of seeing Dr. Theron Flavin back to be safe with the nose  Please stop by lab before you go If you have mychart- we will send your results within 3 business days of us  receiving them.  If you do not have mychart- we will call you about results within 5 business days of us  receiving them.  *please also note that you will see labs on mychart as soon as they post. I will later go in and write notes on them- will say "notes from Dr. Arlene Ben"   Recommended follow up: Return in about 6 months (around 11/02/2024) for followup or sooner if needed.Schedule b4 you leave.

## 2024-05-03 ENCOUNTER — Ambulatory Visit: Payer: Self-pay | Admitting: Family Medicine

## 2024-05-03 MED ORDER — VALSARTAN 160 MG PO TABS: 160.0000 mg | ORAL_TABLET | Freq: Every day | ORAL | 3 refills | Status: DC

## 2024-05-08 ENCOUNTER — Other Ambulatory Visit: Payer: Self-pay | Admitting: Family Medicine

## 2024-05-08 ENCOUNTER — Telehealth: Payer: Self-pay

## 2024-05-08 ENCOUNTER — Other Ambulatory Visit: Payer: Self-pay

## 2024-05-08 MED ORDER — VALSARTAN 80 MG PO TABS: 80.0000 mg | ORAL_TABLET | Freq: Two times a day (BID) | ORAL | 2 refills | Status: DC

## 2024-05-08 NOTE — Telephone Encounter (Signed)
 Corrected Rx sent to [facility].

## 2024-05-10 ENCOUNTER — Telehealth: Payer: Self-pay

## 2024-05-10 NOTE — Telephone Encounter (Signed)
 Called and lm on pt vm. Pt can take the medication how ever it is easiest for him. If he wants to take 1 of the 160mg  a day he can if that's easier OR if he needs to split it up he can take 2 of the 80mg  1 in the morning and one at night.  Copied from CRM [id number]. Topic: Clinical - Medication Question >> May 10, 2024 10:24 AM Luane Rumps D wrote: Reason for CRM: [facility] Pharmacist calling in regards to valsartan  (DIOVAN ) 80 MG tablet Patients wife received 160mg  1x a day in the mail from [facility], however, they are no longer using [facility] and [facility] has received 80mg  2x a day. Patient is confused and requesting clarification on whether to take 160mg  1x a day or 80mg  2x a day. Pharmacy Callback#: [phone]

## 2024-05-17 DIAGNOSIS — Z85828 Personal history of other malignant neoplasm of skin: Secondary | ICD-10-CM | POA: Diagnosis not present

## 2024-05-17 DIAGNOSIS — L821 Other seborrheic keratosis: Secondary | ICD-10-CM | POA: Diagnosis not present

## 2024-05-17 DIAGNOSIS — L814 Other melanin hyperpigmentation: Secondary | ICD-10-CM | POA: Diagnosis not present

## 2024-05-17 DIAGNOSIS — I788 Other diseases of capillaries: Secondary | ICD-10-CM | POA: Diagnosis not present

## 2024-05-17 DIAGNOSIS — D0439 Carcinoma in situ of skin of other parts of face: Secondary | ICD-10-CM | POA: Diagnosis not present

## 2024-05-17 DIAGNOSIS — L570 Actinic keratosis: Secondary | ICD-10-CM | POA: Diagnosis not present

## 2024-05-17 DIAGNOSIS — L718 Other rosacea: Secondary | ICD-10-CM | POA: Diagnosis not present

## 2024-05-17 DIAGNOSIS — D2239 Melanocytic nevi of other parts of face: Secondary | ICD-10-CM | POA: Diagnosis not present

## 2024-06-25 DIAGNOSIS — E119 Type 2 diabetes mellitus without complications: Secondary | ICD-10-CM | POA: Diagnosis not present

## 2024-06-25 DIAGNOSIS — H26491 Other secondary cataract, right eye: Secondary | ICD-10-CM | POA: Diagnosis not present

## 2024-06-25 LAB — HM DIABETES EYE EXAM

## 2024-07-02 ENCOUNTER — Other Ambulatory Visit: Payer: Self-pay | Admitting: Family Medicine

## 2024-08-08 DIAGNOSIS — H26491 Other secondary cataract, right eye: Secondary | ICD-10-CM | POA: Diagnosis not present

## 2024-08-15 DIAGNOSIS — H26492 Other secondary cataract, left eye: Secondary | ICD-10-CM | POA: Diagnosis not present

## 2024-08-30 ENCOUNTER — Encounter: Payer: Self-pay | Admitting: Family Medicine

## 2024-11-05 ENCOUNTER — Encounter: Payer: Self-pay | Admitting: Family Medicine

## 2024-11-05 ENCOUNTER — Ambulatory Visit: Payer: Self-pay | Admitting: Family Medicine

## 2024-11-05 ENCOUNTER — Ambulatory Visit: Admitting: Family Medicine

## 2024-11-05 VITALS — BP 150/80 | HR 62 | Temp 97.8°F | Ht 67.0 in | Wt 189.4 lb

## 2024-11-05 DIAGNOSIS — G3184 Mild cognitive impairment, so stated: Secondary | ICD-10-CM

## 2024-11-05 DIAGNOSIS — J439 Emphysema, unspecified: Secondary | ICD-10-CM | POA: Diagnosis not present

## 2024-11-05 DIAGNOSIS — E538 Deficiency of other specified B group vitamins: Secondary | ICD-10-CM

## 2024-11-05 DIAGNOSIS — E1169 Type 2 diabetes mellitus with other specified complication: Secondary | ICD-10-CM

## 2024-11-05 DIAGNOSIS — E118 Type 2 diabetes mellitus with unspecified complications: Secondary | ICD-10-CM

## 2024-11-05 DIAGNOSIS — E785 Hyperlipidemia, unspecified: Secondary | ICD-10-CM | POA: Diagnosis not present

## 2024-11-05 DIAGNOSIS — Z7984 Long term (current) use of oral hypoglycemic drugs: Secondary | ICD-10-CM

## 2024-11-05 DIAGNOSIS — E119 Type 2 diabetes mellitus without complications: Secondary | ICD-10-CM

## 2024-11-05 DIAGNOSIS — I10 Essential (primary) hypertension: Secondary | ICD-10-CM

## 2024-11-05 LAB — LIPID PANEL
Cholesterol: 172 mg/dL (ref 0–200)
HDL: 50.6 mg/dL (ref >39.00)
LDL Cholesterol: 98 mg/dL (ref 0–99)
NonHDL: 121.38
Total CHOL/HDL Ratio: 3
Triglycerides: 116 mg/dL (ref 0.0–149.0)
VLDL: 23.2 mg/dL (ref 0.0–40.0)

## 2024-11-05 LAB — CBC WITH DIFFERENTIAL/PLATELET
Basophils Absolute: 0 K/uL (ref 0.0–0.1)
Basophils Relative: 0.6 % (ref 0.0–3.0)
Eosinophils Absolute: 0.3 K/uL (ref 0.0–0.7)
Eosinophils Relative: 5.2 % — ABNORMAL HIGH (ref 0.0–5.0)
HCT: 39.7 % (ref 39.0–52.0)
Hemoglobin: 13.4 g/dL (ref 13.0–17.0)
Lymphocytes Relative: 29.9 % (ref 12.0–46.0)
Lymphs Abs: 1.9 K/uL (ref 0.7–4.0)
MCHC: 33.9 g/dL (ref 30.0–36.0)
MCV: 86.9 fl (ref 78.0–100.0)
Monocytes Absolute: 0.6 K/uL (ref 0.1–1.0)
Monocytes Relative: 9.6 % (ref 3.0–12.0)
Neutro Abs: 3.4 K/uL (ref 1.4–7.7)
Neutrophils Relative %: 54.7 % (ref 43.0–77.0)
Platelets: 210 K/uL (ref 150.0–400.0)
RBC: 4.57 Mil/uL (ref 4.22–5.81)
RDW: 14 % (ref 11.5–15.5)
WBC: 6.3 K/uL (ref 4.0–10.5)

## 2024-11-05 LAB — COMPREHENSIVE METABOLIC PANEL WITH GFR
ALT: 17 U/L (ref 0–53)
AST: 20 U/L (ref 0–37)
Albumin: 4.3 g/dL (ref 3.5–5.2)
Alkaline Phosphatase: 81 U/L (ref 39–117)
BUN: 11 mg/dL (ref 6–23)
CO2: 32 meq/L (ref 19–32)
Calcium: 9.5 mg/dL (ref 8.4–10.5)
Chloride: 102 meq/L (ref 96–112)
Creatinine, Ser: 0.91 mg/dL (ref 0.40–1.50)
GFR: 75.36 mL/min (ref >60.00)
Glucose, Bld: 119 mg/dL — ABNORMAL HIGH (ref 70–99)
Potassium: 3.8 meq/L (ref 3.5–5.1)
Sodium: 139 meq/L (ref 135–145)
Total Bilirubin: 0.9 mg/dL (ref 0.2–1.2)
Total Protein: 7.4 g/dL (ref 6.0–8.3)

## 2024-11-05 LAB — HEMOGLOBIN A1C: Hgb A1c MFr Bld: 6.5 % (ref 4.6–6.5)

## 2024-11-05 LAB — VITAMIN B12: Vitamin B-12: 1097 pg/mL — ABNORMAL HIGH (ref 211–911)

## 2024-11-05 MED ORDER — VALSARTAN 160 MG PO TABS: 160.0000 mg | ORAL_TABLET | Freq: Every day | ORAL | 3 refills | Status: AC

## 2024-11-05 NOTE — Progress Notes (Signed)
 Phone [phone] In person visit   Subjective:   Mitchell Walker is a 89 y.o. year old very pleasant male patient who presents for/with See problem oriented charting Chief Complaint  Patient presents with   Medical Management of Chronic Issues    Here for 6 month health follow-up with PCP. No questions or concerns.     Past Medical History-  Patient Active Problem List   Diagnosis Date Noted   Advanced directives, counseling/discussion 05/20/2017    Priority: High   Diabetes mellitus (HCC) 05/20/2017    Priority: High   B12 deficiency 05/02/2017    Priority: High   MCI (mild cognitive impairment) 10/22/2016    Priority: High   Aortic atherosclerosis 10/20/2020    Priority: Medium    Emphysema lung (HCC) 10/01/2020    Priority: Medium    Glaucoma 12/27/2017    Priority: Medium    Hyperlipidemia associated with type 2 diabetes mellitus (HCC) 02/03/2009    Priority: Medium    Hypertension 07/17/2007    Priority: Medium    Former smoker 05/21/2015    Priority: Low   Adenomatous colon polyp 05/21/2015    Priority: Low   Chronic frontal sinusitis 12/30/2010    Priority: Low   ACNE ROSACEA 04/15/2010    Priority: Low   Actinic keratosis 02/03/2009    Priority: Low   Basal cell carcinoma of skin 02/29/2008    Priority: Low   ERECTILE DYSFUNCTION 12/11/2007    Priority: Low   Osteoarthritis 07/17/2007    Priority: Low    Medications- reviewed and updated Current Outpatient Medications  Medication Sig Dispense Refill   albuterol  (PROAIR  HFA) 108 (90 Base) MCG/ACT inhaler Inhale 2 puffs into the lungs every 6 (six) hours as needed for wheezing or shortness of breath (or cough). 1 each 2   amLODipine  (NORVASC ) 10 MG tablet TAKE 1 TABLET BY MOUTH ONCE DAILY 90 tablet 2   Blood Glucose Monitoring Suppl (FREESTYLE LITE) w/Device KIT 1 kit by Does not apply route daily at 2 PM. Use to test blood sugars daily. Dx: E11.9 1 kit 3   Cholecalciferol (VITAMIN D-3) 125 MCG (5000  UT) TABS Take 1 tablet by mouth daily.     ezetimibe  (ZETIA ) 10 MG tablet TAKE 1 TABLET BY MOUTH ONCE DAILY 90 tablet 2   glucose blood (FREESTYLE LITE) test strip Use to test blood sugars daily. Dx: E11.9 100 each 12   Lancets (FREESTYLE) lancets Use to test blood sugars daily. Dx: E11.9 100 each 12   metFORMIN  (GLUCOPHAGE ) 500 MG tablet TAKE 1 TABLET BY MOUTH ONCE DAILY WITH BREAKFAST *TAKE WITH FOOD* 90 tablet 2   Multiple Vitamins-Minerals (MULTIVITAMIN ADULTS 50+) TABS      Omega-3 Fatty Acids (FISH OIL) 1200 MG CAPS      vitamin B-12 (CYANOCOBALAMIN ) 1000 MCG tablet Take 1,000 mcg by mouth daily.     valsartan  (DIOVAN ) 160 MG tablet Take 1 tablet (160 mg total) by mouth daily. 90 tablet 3   No current facility-administered medications for this visit.     Objective:  BP (!) 150/80   Pulse 62   Temp 97.8 F (36.6 C) (Temporal)   Ht 5' 7 (1.702 m)   Wt 189 lb 6.4 oz (85.9 kg)   SpO2 97%   BMI 29.66 kg/m  Gen: NAD, resting comfortably CV: RRR no murmurs rubs or gallops Lungs: CTAB no crackles, wheeze, rhonchi Ext: trace edema Skin: warm, dry Neuro:looks to wife for some answers  Diabetic Foot  Exam - Simple   Simple Foot Form Diabetic Foot exam was performed with the following findings: Yes 11/05/2024 10:02 AM  Visual Inspection No deformities, no ulcerations, no other skin breakdown bilaterally: Yes Sensation Testing Intact to touch and monofilament testing bilaterally: Yes Pulse Check Posterior Tibialis and Dorsalis pulse intact bilaterally: Yes Comments        Assessment and Plan   % Mild cognitive impairment-follows with Dr. Tobie  -MMSE 28/30 in 2017 -in 04/27/2023  was 27/30  so very stable- continue to monitor  -still some repetitive questions- sees Dr. Tobie again in may  #hypertension S: compliant with Amlodipine  10 mg and valsartan  80 mg -Chronic cough: Benazepril  20mg  -off hctz with electrolyte issues  - reports around high 130s BP Readings from  Last 3 Encounters:  11/05/24 (!) 150/80  05/02/24 138/70  04/30/24 (!) 160/83  A/P: blood pressure slightly high today even on repeat- has not had change to increase valsartan  to 160 mg a day- we are going of the make this change with prior UACR slightly high- recheck in office in 2 months or sooner if needed  # Diabetes S: Medication: Metformin  500 mg daily CBGs- 124-128 everytime he checks Exercise and diet- doing chair volleyball but does injure his back . Needs to be careful with the [place] food! Enjoying pasta some and sugar free ice cream twie a week Lab Results  Component Value Date   HGBA1C 6.8 (H) 05/02/2024   HGBA1C 6.7 (H) 10/31/2023   HGBA1C 6.4 04/27/2023  A/P: hopefully stable- update a1c today. Continue current meds for now    #hyperlipidemia/aortic atherosclerosis/coronary artery calcifications- incidental finding but wants to remain off statin #Coronary calcium-noted on CT chest S: We did add zetia  10 mg in light of aortic atherosclerosis but wanting to be cautious about statin due to memory concerns  Lab Results  Component Value Date   CHOL 177 10/31/2023   HDL 48.90 10/31/2023   LDLCALC 110 (H) 10/31/2023   LDLDIRECT 102.0 06/29/2022   TRIG 89.0 10/31/2023   CHOLHDL 4 10/31/2023   A/P:  lipids above goal but he's hesitant to increase medications due to memory concerns- continue to monitor  on Zetia  alone  # B12 Deficiency S: He remains on daily supplements 1000 mcg  Lab Results  Component Value Date   VITAMINB12 407 10/31/2023  A/P: well controlled- update today- continue current medications for now   #emphysema- incidental finding on imaging chest xray 2021 - albuterol  available if needed but last fill 2021- not needing - mild cough at night- continue to monitor   Recommended follow up: Return in about 2 months (around 01/05/2025) for followup or sooner if needed.Schedule b4 you leave. Future Appointments  Date Time Provider Department Center   04/29/2025  9:30 AM Patel, Donika K, DO [id number] None   Lab/Order associations:   ICD-10-CM   1. Type 2 diabetes mellitus with unspecified complications (HCC)  E11.8 Comprehensive metabolic panel with GFR    Hemoglobin A1c    Lipid panel    CBC with Differential/Platelet    2. Primary hypertension  I10 Comprehensive metabolic panel with GFR    Lipid panel    CBC with Differential/Platelet    3. MCI (mild cognitive impairment)  G31.84     4. Hyperlipidemia associated with type 2 diabetes mellitus (HCC)  E11.69    E78.5     5. Pulmonary emphysema (HCC)  J43.9     6. B12 deficiency  E53.8 Vitamin B12  Meds ordered this encounter  Medications   valsartan  (DIOVAN ) 160 MG tablet    Sig: Take 1 tablet (160 mg total) by mouth daily.    Dispense:  90 tablet    Refill:  3    PATIENT REPORTS NEW INSTRUCTIONS TO TAKE 80 MG TWICE DAILY. PLEASE SEND NEW RX WITH UPDATED INSTRUCTIONS    Return precautions advised.  Garnette Lukes, MD

## 2024-11-05 NOTE — Patient Instructions (Addendum)
 Please stop by lab before you go If you have mychart- we will send your results within 3 business days of us  receiving them.  If you do not have mychart- we will call you about results within 5 business days of us  receiving them.  *please also note that you will see labs on mychart as soon as they post. I will later go in and write notes on them- will say notes from Dr. Katrinka   Increase valsartan  to 160 mg and follow up in  months for recheck or if you are checking pressure at home and regularly over 140 we may go to 320 mg- you can update me  Recommended follow up: Return in about 2 months (around 01/05/2025) for followup or sooner if needed.Schedule b4 you leave.

## 2025-01-15 ENCOUNTER — Ambulatory Visit: Admitting: Family Medicine

## 2025-01-15 ENCOUNTER — Encounter: Payer: Self-pay | Admitting: Family Medicine

## 2025-01-21 ENCOUNTER — Ambulatory Visit: Admitting: Family Medicine

## 2025-02-08 ENCOUNTER — Ambulatory Visit: Admitting: Family Medicine

## 2025-04-29 ENCOUNTER — Ambulatory Visit: Admitting: Neurology
